# Patient Record
Sex: Female | Born: 1965 | Race: Black or African American | Hispanic: No | Smoking: Current some day smoker
Health system: Southern US, Community
[De-identification: ages and names within clinical notes are randomized; demographics above are authoritative.]

## PROBLEM LIST (undated history)

## (undated) DIAGNOSIS — N926 Irregular menstruation, unspecified: Secondary | ICD-10-CM

## (undated) DIAGNOSIS — F32A Depression, unspecified: Secondary | ICD-10-CM

## (undated) DIAGNOSIS — T4145XA Adverse effect of unspecified anesthetic, initial encounter: Secondary | ICD-10-CM

## (undated) DIAGNOSIS — M1990 Unspecified osteoarthritis, unspecified site: Secondary | ICD-10-CM

## (undated) DIAGNOSIS — I499 Cardiac arrhythmia, unspecified: Secondary | ICD-10-CM

## (undated) DIAGNOSIS — D649 Anemia, unspecified: Secondary | ICD-10-CM

## (undated) DIAGNOSIS — F329 Major depressive disorder, single episode, unspecified: Secondary | ICD-10-CM

## (undated) DIAGNOSIS — E559 Vitamin D deficiency, unspecified: Secondary | ICD-10-CM

## (undated) DIAGNOSIS — Z8489 Family history of other specified conditions: Secondary | ICD-10-CM

## (undated) DIAGNOSIS — I10 Essential (primary) hypertension: Secondary | ICD-10-CM

## (undated) DIAGNOSIS — E119 Type 2 diabetes mellitus without complications: Secondary | ICD-10-CM

## (undated) HISTORY — DX: Vitamin D deficiency, unspecified: E55.9

## (undated) HISTORY — PX: CHOLECYSTECTOMY: SHX55

## (undated) HISTORY — DX: Essential (primary) hypertension: I10

## (undated) HISTORY — PX: KNEE ARTHROSCOPY: SUR90

## (undated) HISTORY — PX: JOINT REPLACEMENT: SHX530

## (undated) HISTORY — DX: Irregular menstruation, unspecified: N92.6

## (undated) HISTORY — DX: Type 2 diabetes mellitus without complications: E11.9

---

## 1999-11-30 ENCOUNTER — Emergency Department (HOSPITAL_COMMUNITY): Admission: EM | Admit: 1999-11-30 | Discharge: 1999-11-30 | Payer: Self-pay | Admitting: Emergency Medicine

## 2000-07-09 DIAGNOSIS — T8859XA Other complications of anesthesia, initial encounter: Secondary | ICD-10-CM

## 2000-07-09 HISTORY — DX: Other complications of anesthesia, initial encounter: T88.59XA

## 2008-05-14 ENCOUNTER — Emergency Department (HOSPITAL_BASED_OUTPATIENT_CLINIC_OR_DEPARTMENT_OTHER): Admission: EM | Admit: 2008-05-14 | Discharge: 2008-05-14 | Payer: Self-pay | Admitting: Emergency Medicine

## 2009-01-11 ENCOUNTER — Emergency Department (HOSPITAL_BASED_OUTPATIENT_CLINIC_OR_DEPARTMENT_OTHER): Admission: EM | Admit: 2009-01-11 | Discharge: 2009-01-11 | Payer: Self-pay | Admitting: Emergency Medicine

## 2009-04-10 ENCOUNTER — Emergency Department (HOSPITAL_BASED_OUTPATIENT_CLINIC_OR_DEPARTMENT_OTHER): Admission: EM | Admit: 2009-04-10 | Discharge: 2009-04-10 | Payer: Self-pay | Admitting: Emergency Medicine

## 2009-04-10 ENCOUNTER — Ambulatory Visit: Payer: Self-pay | Admitting: Diagnostic Radiology

## 2010-10-05 IMAGING — CR DG KNEE COMPLETE 4+V*L*
4 series · 4 of 4 positions shown · non-contrast
Comparison: None.

CLINICAL DATA: Left knee pain following a fall.  Previous knee
surgery.

LEFT KNEE - COMPLETE 4+ VIEW

[t knee ap left]
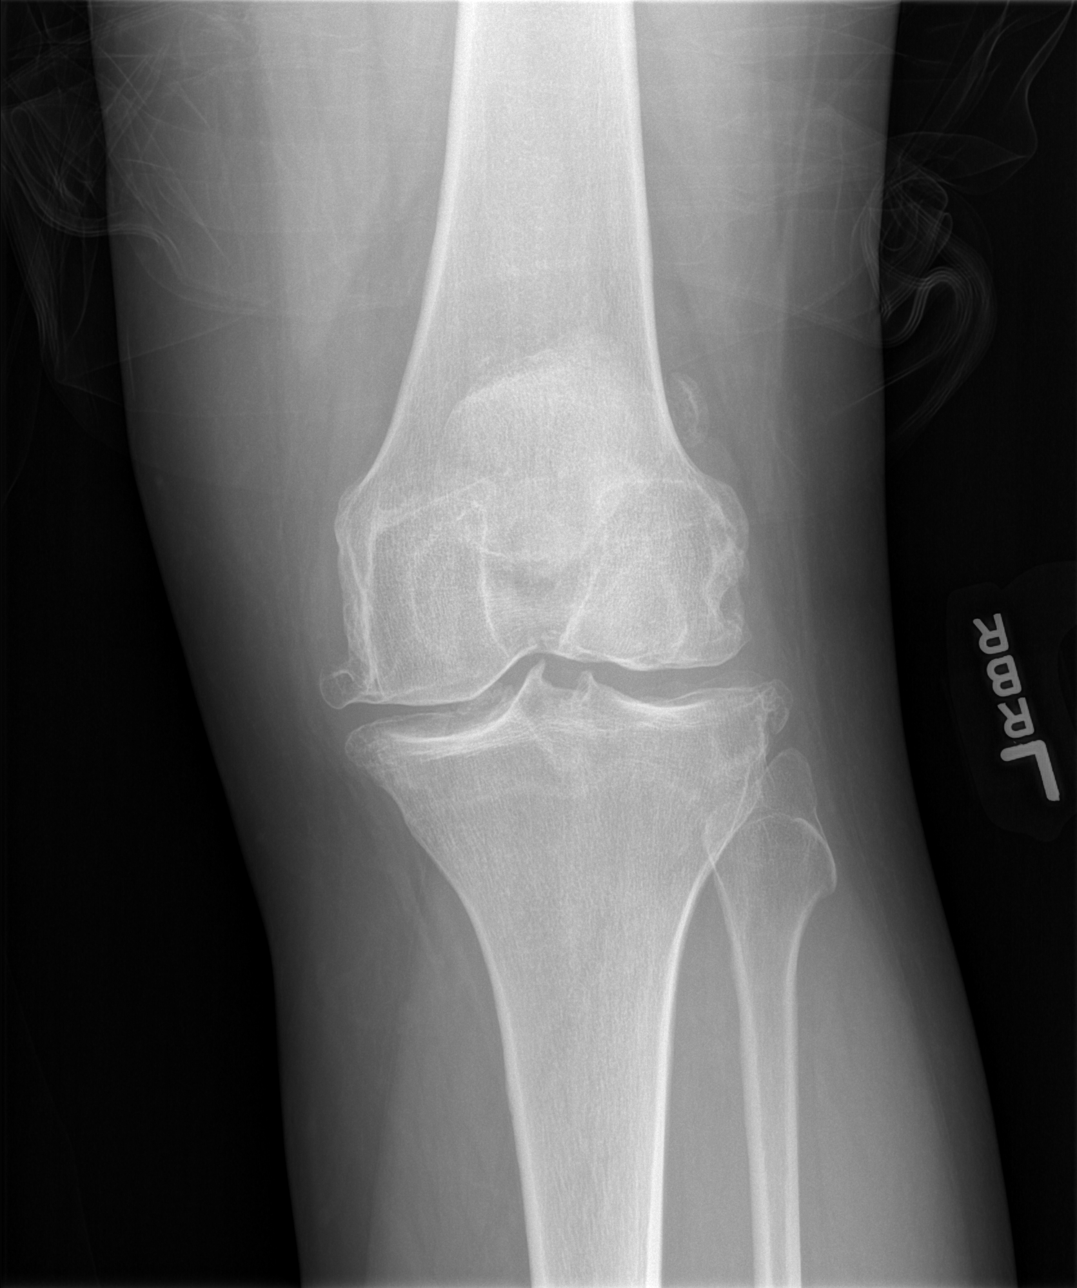

[t knee oblique left (1 of 2)]
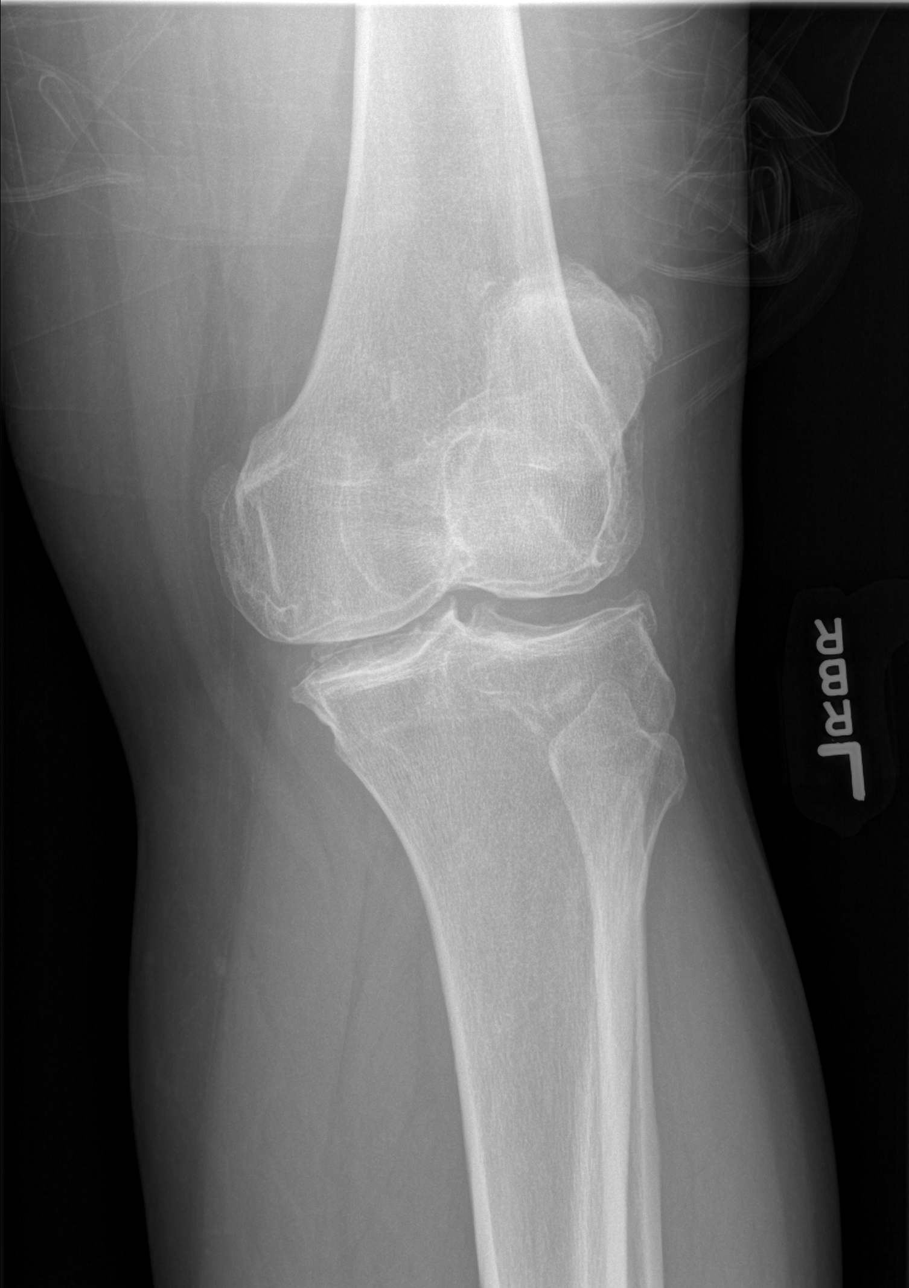

[t knee oblique left (2 of 2)]
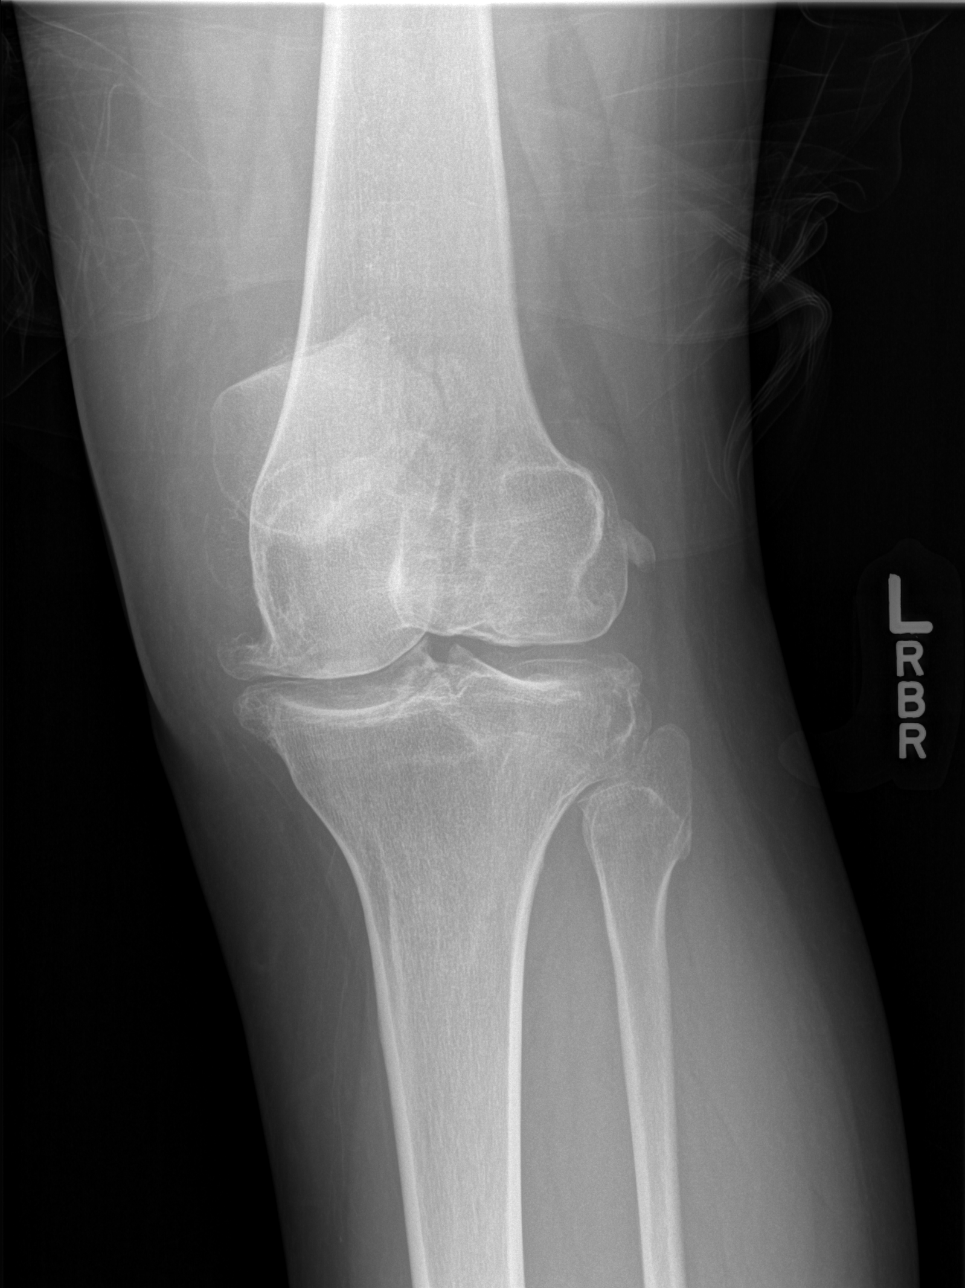

[t knee lat left]
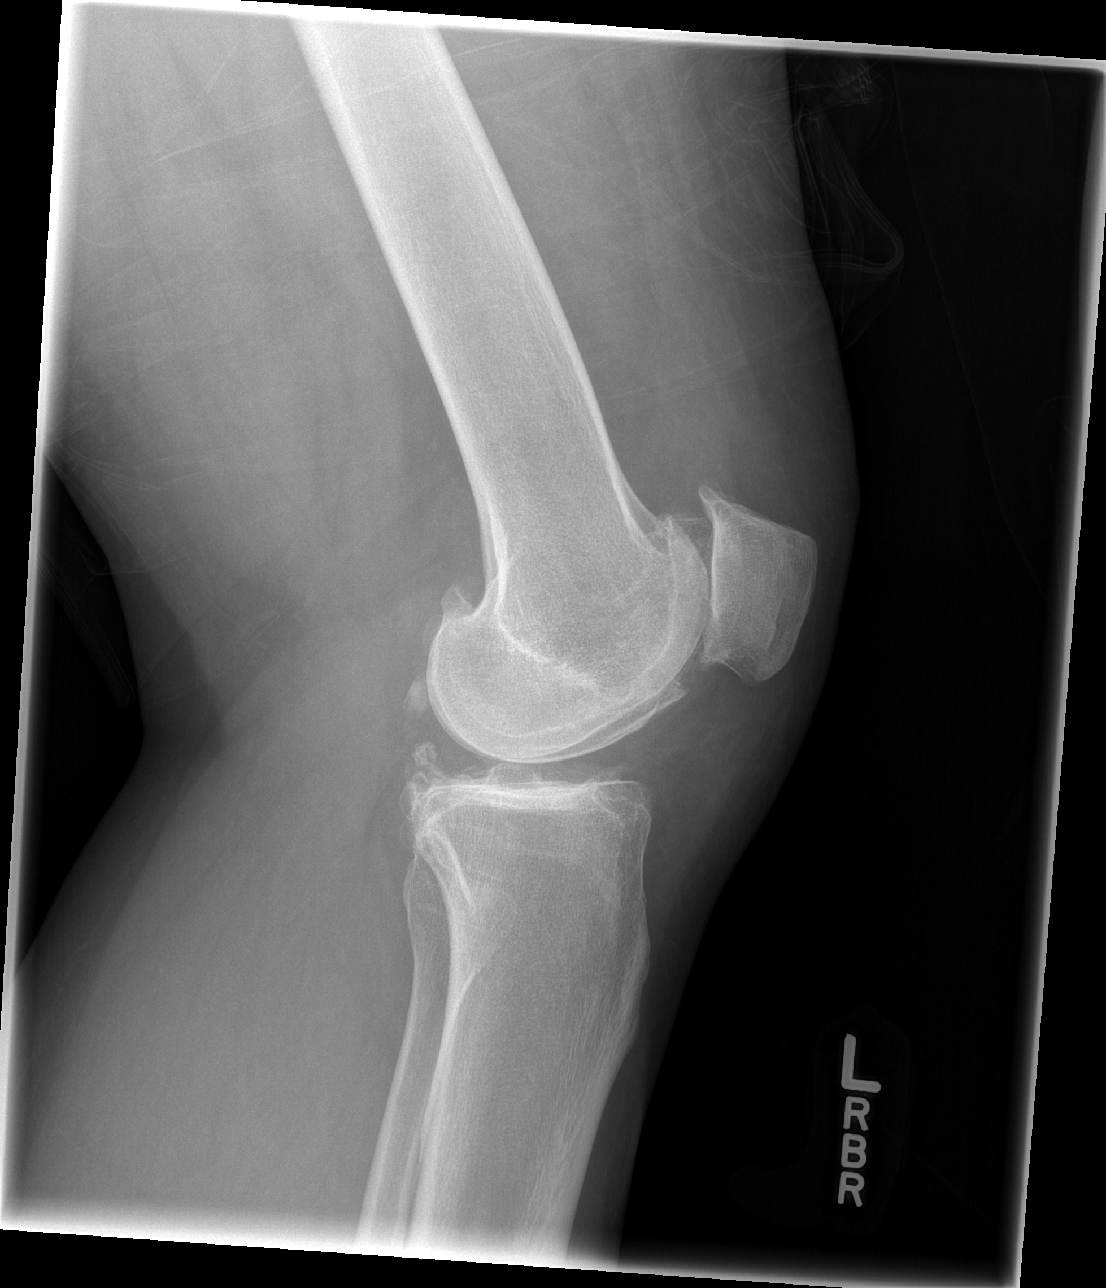

[4 of 4 positions shown; findings below may reference images not displayed]

FINDINGS: Moderate spur formation involving all three joint
compartments.  Posterior loose bodies.  Small to moderate sized
effusion.  No fracture or dislocation seen.
IMPRESSION: 1.  Moderate degenerative changes.
2.  Small to moderate sized effusion.
3.  Posterior loose bodies.
4.  No fracture seen.

## 2010-10-05 IMAGING — CR DG FOOT COMPLETE 3+V*L*
3 series · 3 of 3 positions shown · non-contrast
Comparison: None.

CLINICAL DATA: Left foot pain following a fall.

LEFT FOOT - COMPLETE 3+ VIEW

[t foot ap left]
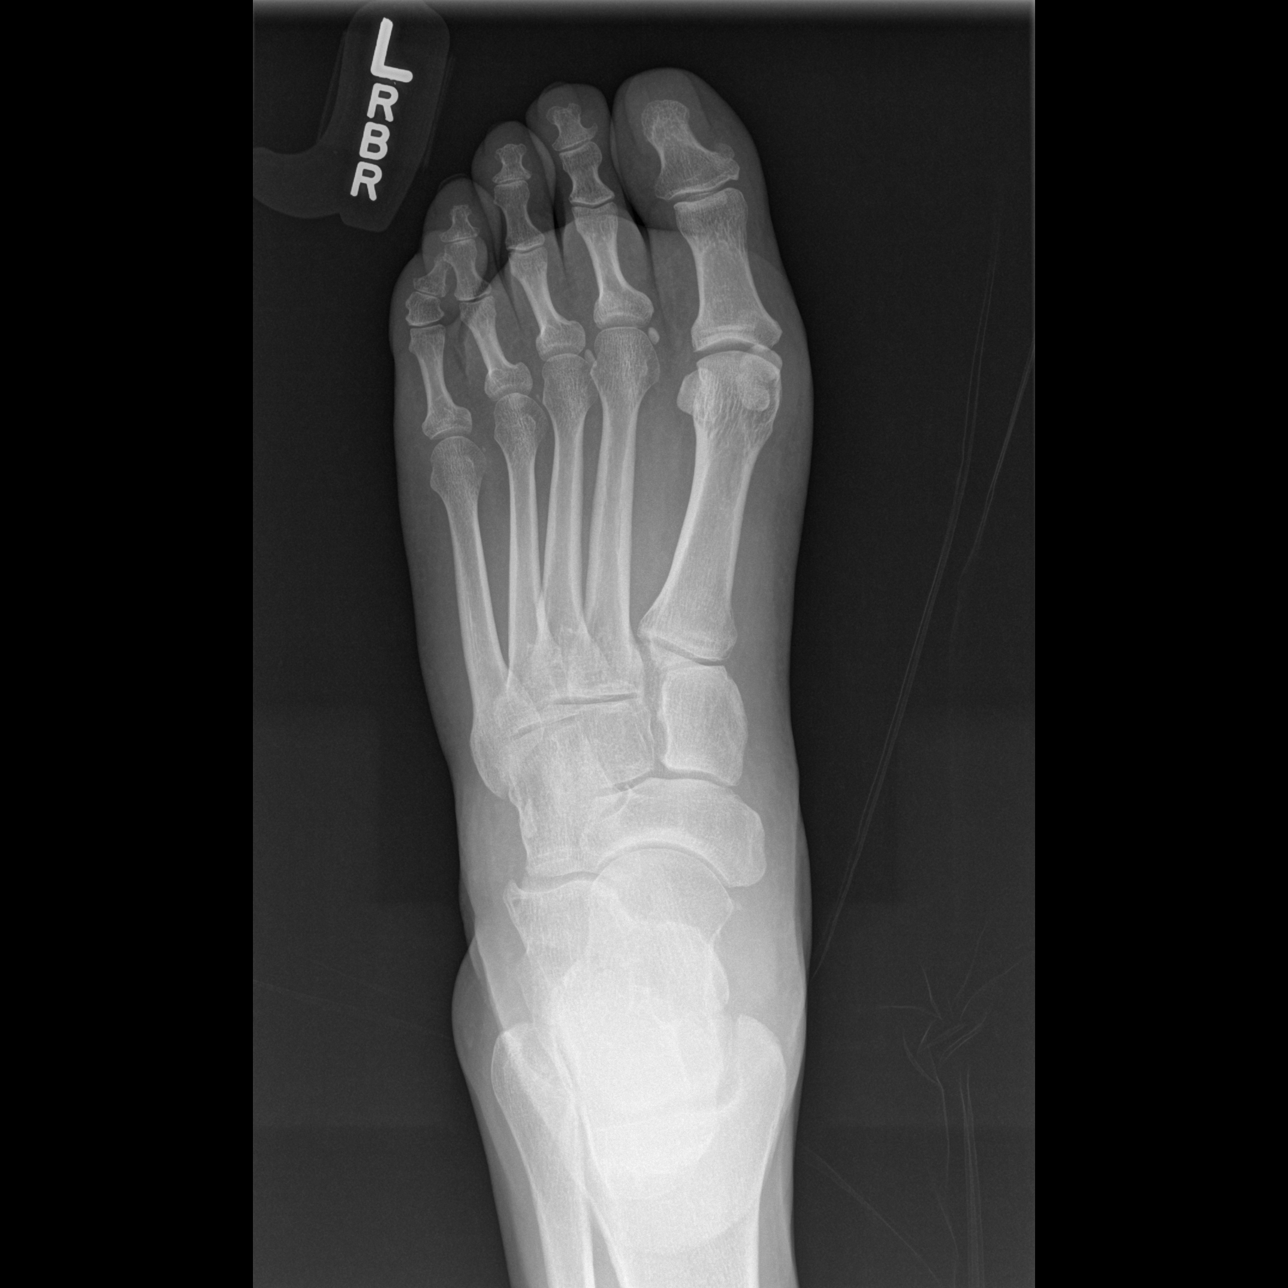

[t foot oblique left]
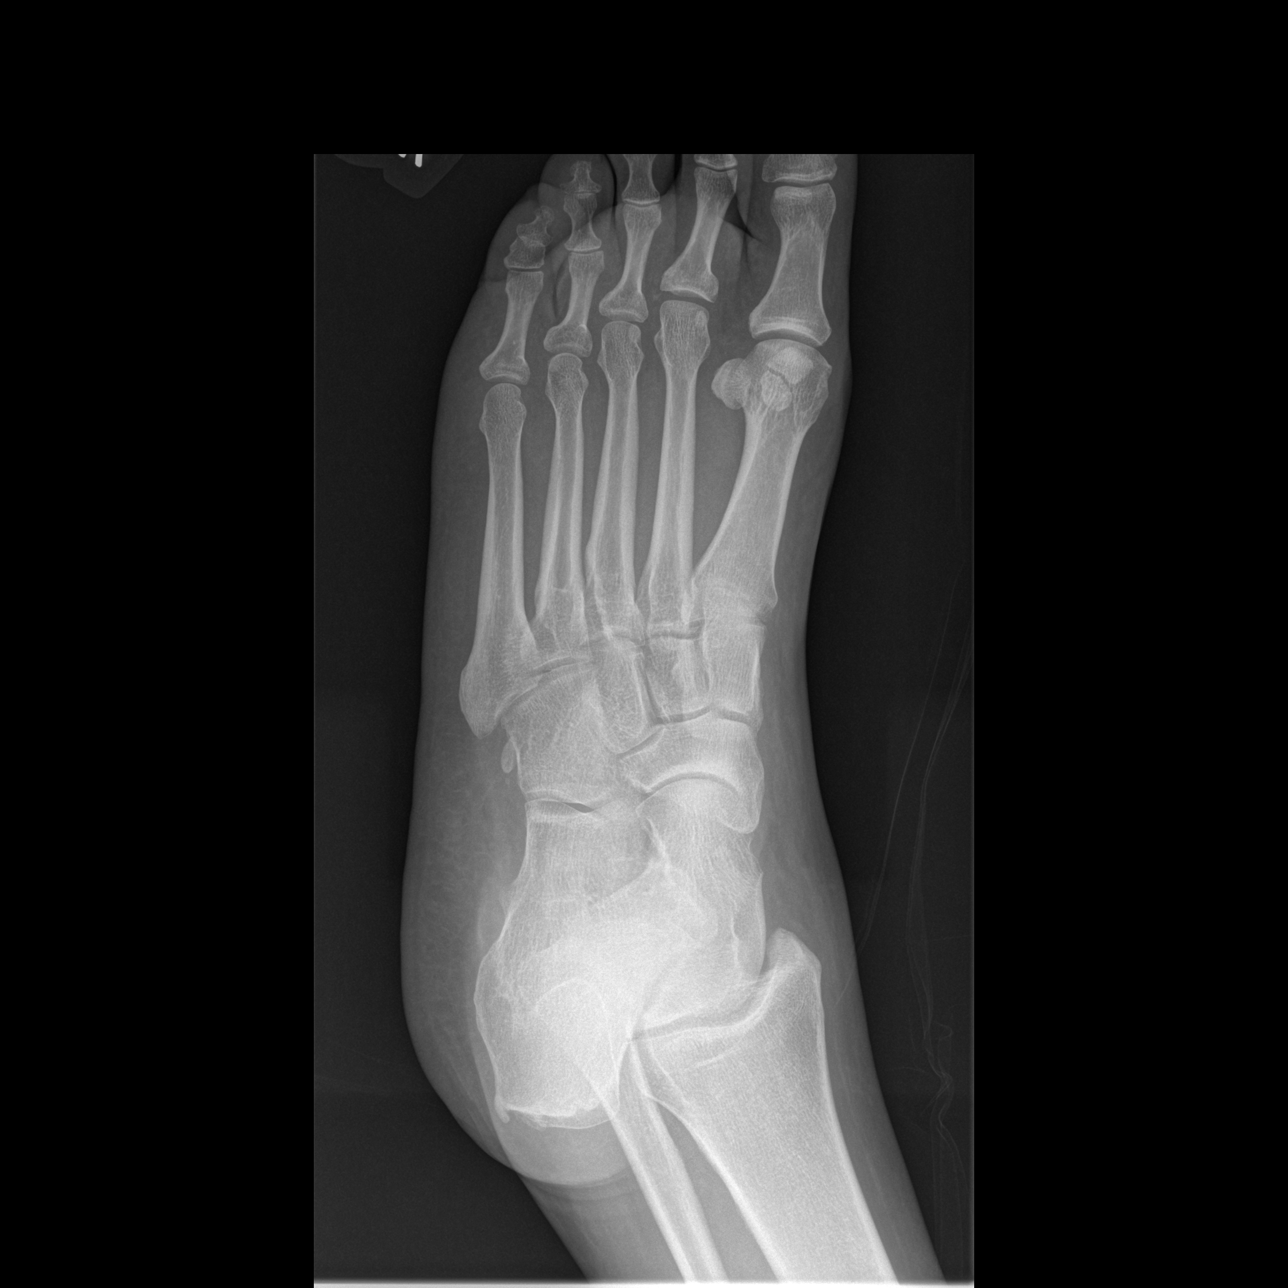

[t foot lat left]
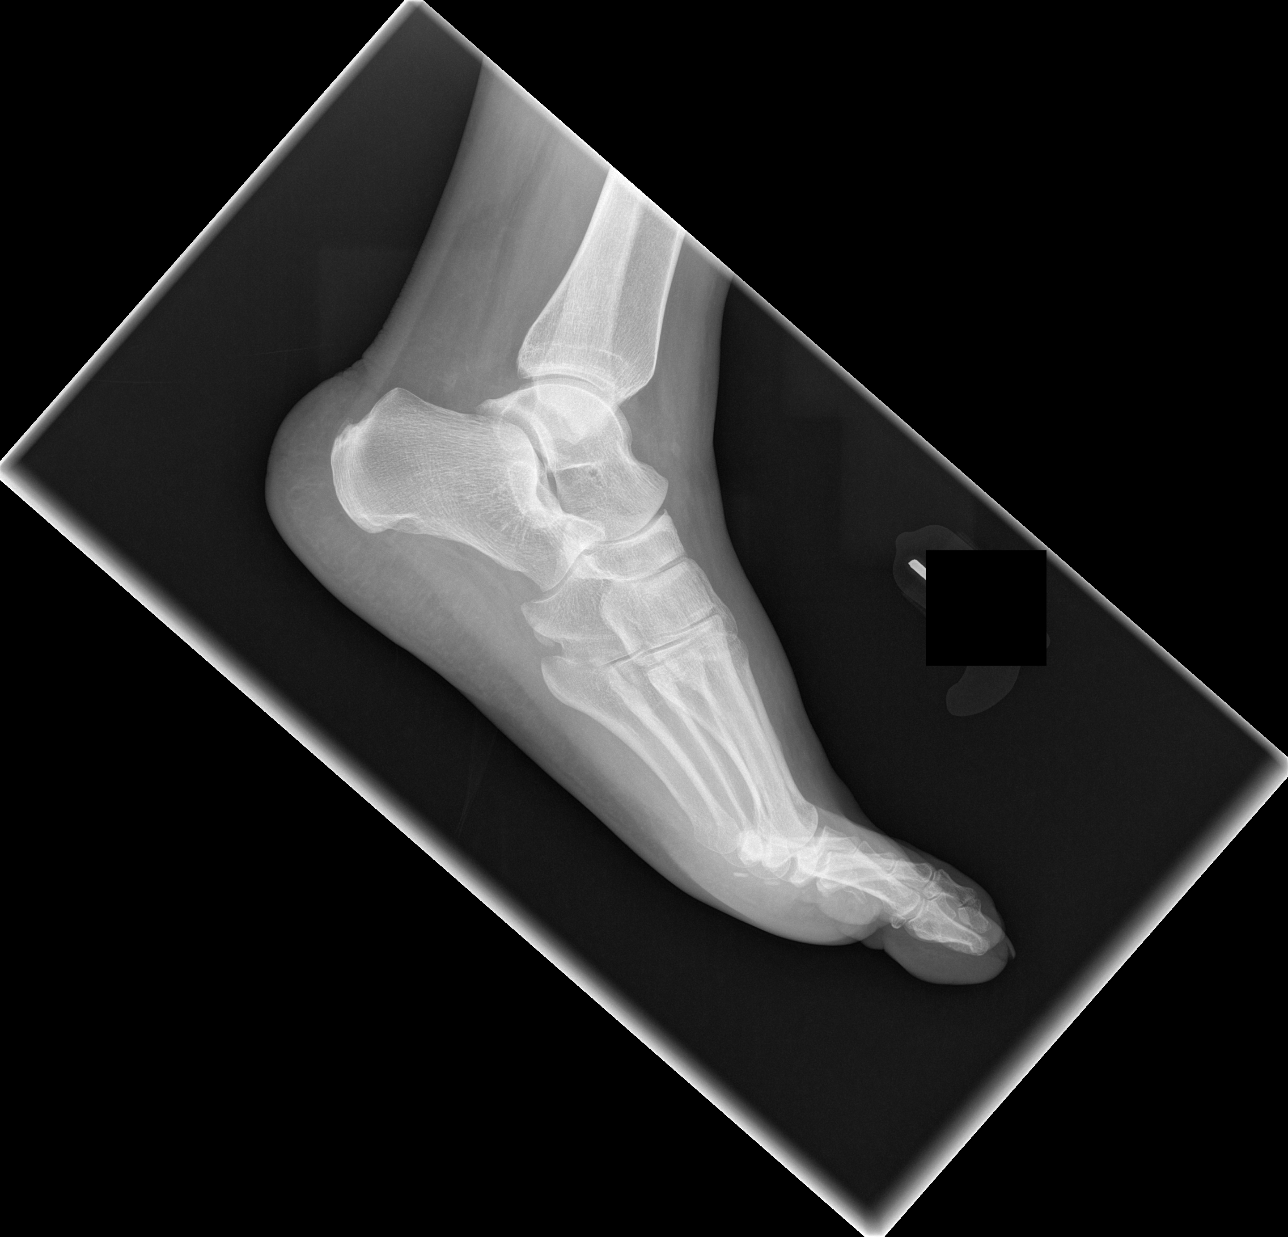

[3 of 3 positions shown; findings below may reference images not displayed]

FINDINGS: Mild posterior calcaneal spur formation.  No fracture or
dislocation seen.
IMPRESSION: No fracture or dislocation.

## 2010-10-12 LAB — URINE MICROSCOPIC-ADD ON

## 2010-10-12 LAB — COMPREHENSIVE METABOLIC PANEL
AST: 23 U/L (ref 0–37)
Albumin: 3.8 g/dL (ref 3.5–5.2)
Alkaline Phosphatase: 55 U/L (ref 39–117)
BUN: 11 mg/dL (ref 6–23)
Chloride: 105 mEq/L (ref 96–112)
Creatinine, Ser: 0.8 mg/dL (ref 0.4–1.2)
GFR calc Af Amer: >60 mL/min (ref >60)
Glucose, Bld: 220 mg/dL — ABNORMAL HIGH (ref 70–99)
Sodium: 140 mEq/L (ref 135–145)
Total Bilirubin: 0.2 mg/dL — ABNORMAL LOW (ref 0.3–1.2)

## 2010-10-12 LAB — URINALYSIS, ROUTINE W REFLEX MICROSCOPIC
Glucose, UA: NEGATIVE mg/dL
Ketones, ur: 15 mg/dL — AB
Nitrite: NEGATIVE
Specific Gravity, Urine: 1.022 (ref 1.005–1.030)
Urobilinogen, UA: 0.2 mg/dL (ref 0.0–1.0)
pH: 5.5 (ref 5.0–8.0)

## 2010-10-12 LAB — DIFFERENTIAL
Basophils Absolute: 0.5 10*3/uL — ABNORMAL HIGH (ref 0.0–0.1)
Lymphocytes Relative: 21 % (ref 12–46)
Monocytes Absolute: 0.8 10*3/uL (ref 0.1–1.0)

## 2010-10-12 LAB — CBC
HCT: 37.4 % (ref 36.0–46.0)
Hemoglobin: 13.2 g/dL (ref 12.0–15.0)
MCHC: 35.2 g/dL (ref 30.0–36.0)
MCV: 92.1 fL (ref 78.0–100.0)
RDW: 12.3 % (ref 11.5–15.5)
WBC: 15.1 10*3/uL — ABNORMAL HIGH (ref 4.0–10.5)

## 2010-10-12 LAB — URINE CULTURE

## 2013-03-17 ENCOUNTER — Emergency Department: Payer: Self-pay | Admitting: Emergency Medicine

## 2013-03-17 LAB — CK TOTAL AND CKMB (NOT AT ARMC): CK, Total: 97 U/L (ref 21–215)

## 2013-03-17 LAB — COMPREHENSIVE METABOLIC PANEL
Albumin: 3.5 g/dL (ref 3.4–5.0)
EGFR (Non-African Amer.): >60
Potassium: 3.6 mmol/L (ref 3.5–5.1)
SGOT(AST): 20 U/L (ref 15–37)

## 2013-03-17 LAB — CBC
HGB: 12.3 g/dL (ref 12.0–16.0)
RBC: 3.93 10*6/uL (ref 3.80–5.20)
RDW: 13.2 % (ref 11.5–14.5)
WBC: 16 10*3/uL — ABNORMAL HIGH (ref 3.6–11.0)

## 2013-03-17 LAB — PRO B NATRIURETIC PEPTIDE: B-Type Natriuretic Peptide: 13 pg/mL (ref 0–125)

## 2013-03-17 LAB — TROPONIN I
Troponin-I: <0.02 ng/mL
Troponin-I: <0.02 ng/mL

## 2013-07-09 LAB — HM PAP SMEAR: HM PAP: NORMAL

## 2015-07-18 ENCOUNTER — Encounter: Payer: Self-pay | Admitting: Certified Nurse Midwife

## 2015-08-05 ENCOUNTER — Ambulatory Visit (INDEPENDENT_AMBULATORY_CARE_PROVIDER_SITE_OTHER): Payer: 59 | Admitting: Obstetrics and Gynecology

## 2015-08-05 ENCOUNTER — Encounter: Payer: Self-pay | Admitting: Obstetrics and Gynecology

## 2015-08-05 VITALS — BP 132/82 | HR 90 | Ht 69.0 in | Wt 221.1 lb

## 2015-08-05 DIAGNOSIS — N979 Female infertility, unspecified: Secondary | ICD-10-CM

## 2015-08-05 DIAGNOSIS — E1165 Type 2 diabetes mellitus with hyperglycemia: Secondary | ICD-10-CM | POA: Insufficient documentation

## 2015-08-05 DIAGNOSIS — Z862 Personal history of diseases of the blood and blood-forming organs and certain disorders involving the immune mechanism: Secondary | ICD-10-CM | POA: Diagnosis not present

## 2015-08-05 DIAGNOSIS — E669 Obesity, unspecified: Secondary | ICD-10-CM | POA: Insufficient documentation

## 2015-08-05 DIAGNOSIS — N938 Other specified abnormal uterine and vaginal bleeding: Secondary | ICD-10-CM

## 2015-08-05 DIAGNOSIS — E118 Type 2 diabetes mellitus with unspecified complications: Secondary | ICD-10-CM

## 2015-08-05 DIAGNOSIS — I10 Essential (primary) hypertension: Secondary | ICD-10-CM | POA: Diagnosis not present

## 2015-08-05 DIAGNOSIS — E119 Type 2 diabetes mellitus without complications: Secondary | ICD-10-CM | POA: Insufficient documentation

## 2015-08-05 NOTE — Patient Instructions (Signed)

## 2015-08-05 NOTE — Progress Notes (Signed)
Subjective:     Patient ID: Michelle Marshall, female   DOB: Mar 13, 1966, 50 y.o.   MRN: [id number]  HPI Reports normal monthly menses since onset at age 33 until early December, at which time her menses started 3 weeks early, bleeding for 5-7 days with heavy flow, then no bleeding x 1 week, and restarted bleeding again for another week. Reports having to change tampon with pads every 1 to 1 1/2 hours the first 3 days of each bleeding episodes, and heavy cramping. Reports feeling really fatigued.  Does have a history of anemia and was lasted treated with iron a year ago.  Denies any other symptoms.  Review of Systems See above, denies weight changes or other symptoms, no hot flashes, and diabetes and HTN are both stable.    Objective:   Physical Exam A&O x4 Well groomed female in no distress Thyroid normal on palpation HRR  Abdomen soft and non-tender. Pelvic exam: normal external genitalia, vulva, vagina, cervix, uterus and adnexa, moderate amount dark red blood noted.    Assessment:     DUB Obesity Fatigue H/O anemia     Plan:     Labs obtained and pelvic ultrasound ordered. Reviewed possible causes of current bleeding, including: perimenopause, thyroid dysfunction; anemia, uterine fibroids, & ovarian cyst.  Reviewed possible treatment options according to findings, and recommended starting a low dose OCP while waiting on results to stop current bleeding. 1 pack Taytula given to start today.  Is strongly interested in ablation therapy if it is indicated.  Will have her see Dr Orlene Plum after ultrasound to discuss more.  Melody Amsterdam, CNM

## 2015-08-06 LAB — FSH/LH
FSH: 11.1 m[IU]/mL
LH: 4.9 m[IU]/mL

## 2015-08-06 LAB — CBC
HEMATOCRIT: 37.2 % (ref 34.0–46.6)
Hemoglobin: 12.8 g/dL (ref 11.1–15.9)
MCH: 30 pg (ref 26.6–33.0)
MCHC: 34.4 g/dL (ref 31.5–35.7)
MCV: 87 fL (ref 79–97)
Platelets: 432 10*3/uL — ABNORMAL HIGH (ref 150–379)
RBC: 4.26 x10E6/uL (ref 3.77–5.28)
RDW: 12.5 % (ref 12.3–15.4)
WBC: 9.4 10*3/uL (ref 3.4–10.8)

## 2015-08-06 LAB — THYROID PANEL WITH TSH
FREE THYROXINE INDEX: 1.7 (ref 1.2–4.9)
T3 UPTAKE RATIO: 25 % (ref 24–39)
T4, Total: 6.9 ug/dL (ref 4.5–12.0)
TSH: 1.22 u[IU]/mL (ref 0.450–4.500)

## 2015-08-06 LAB — ESTRADIOL: ESTRADIOL: 6 pg/mL

## 2015-08-06 LAB — FERRITIN: Ferritin: 18 ng/mL (ref 15–150)

## 2015-08-06 LAB — PROGESTERONE: Progesterone: 0.3 ng/mL

## 2015-08-10 ENCOUNTER — Telehealth: Payer: Self-pay | Admitting: *Deleted

## 2015-08-10 NOTE — Telephone Encounter (Signed)
Notified pt of lab results 

## 2015-08-10 NOTE — Telephone Encounter (Signed)
-----   Message from Joylene Igo, [place] sent at 08/09/2015  5:05 PM EST ----- Please let her know all labs looked normal, and no signs of menopause yet,

## 2015-08-18 ENCOUNTER — Ambulatory Visit (INDEPENDENT_AMBULATORY_CARE_PROVIDER_SITE_OTHER): Payer: 59 | Admitting: Obstetrics and Gynecology

## 2015-08-18 ENCOUNTER — Ambulatory Visit (INDEPENDENT_AMBULATORY_CARE_PROVIDER_SITE_OTHER): Payer: 59

## 2015-08-18 ENCOUNTER — Encounter: Payer: Self-pay | Admitting: Obstetrics and Gynecology

## 2015-08-18 VITALS — BP 138/84 | HR 90 | Ht 69.0 in | Wt 222.5 lb

## 2015-08-18 DIAGNOSIS — D259 Leiomyoma of uterus, unspecified: Secondary | ICD-10-CM | POA: Diagnosis not present

## 2015-08-18 DIAGNOSIS — N938 Other specified abnormal uterine and vaginal bleeding: Secondary | ICD-10-CM

## 2015-08-18 DIAGNOSIS — N939 Abnormal uterine and vaginal bleeding, unspecified: Secondary | ICD-10-CM

## 2015-08-18 MED ORDER — MEDROXYPROGESTERONE ACETATE 10 MG PO TABS: 30.0000 mg | ORAL_TABLET | Freq: Every day | ORAL | Status: DC

## 2015-08-18 NOTE — Patient Instructions (Signed)
Endometrial Ablation °Endometrial ablation removes the lining of the uterus (endometrium). It is usually a same-day, outpatient treatment. Ablation helps avoid major surgery, such as surgery to remove the cervix and uterus (hysterectomy). After endometrial ablation, you will have little or no menstrual bleeding and may not be able to have children. However, if you are premenopausal, you will need to use a reliable method of birth control following the procedure because of the small chance that pregnancy can occur. °There are different reasons to have this procedure. These reasons include: °· Heavy periods. °· Bleeding that is causing anemia. °· Irregular bleeding. °· Bleeding fibroids on the lining inside the uterus if they are smaller than 3 centimeters. °This procedure may not be possible for you if:  °· You want to have children in the future.   °· You have severe cramps with your menstrual period.   °· You have precancerous or cancerous cells in your uterus.   °· You were recently pregnant.   °· You have gone through menopause.   °· You have had major surgery on your uterus, resulting in thinning of the uterine wall. Surgeries may include: °¨ The removal of one or more uterine fibroids (myomectomy). °¨ A cesarean section with a classic (vertical) incision on your uterus. Ask your health care provider what type of cesarean you had. Sometimes the scar on your skin is different than the scar on your uterus. °Even if you have had surgery on your uterus, certain types of ablation may still be safe for you. Talk with your health care provider. °LET YOUR HEALTH CARE PROVIDER KNOW ABOUT: °· Any allergies you have. °· All medicines you are taking, including vitamins, herbs, eye drops, creams, and over-the-counter medicines. °· Previous problems you or members of your family have had with the use of anesthetics. °· Any blood disorders you have. °· Previous surgeries you have had. °· Medical conditions you have. °RISKS AND  COMPLICATIONS  °Generally, this is a safe procedure. However, as with any procedure, complications can occur. Possible complications include: °· Perforation of the uterus. °· Bleeding. °· Infection of the uterus, bladder, or vagina. °· Injury to surrounding organs. °· An air bubble to the lung (air embolus). °· Pregnancy following the procedure. °· Failure of the procedure to help the problem, requiring hysterectomy. °· Decreased ability to diagnose cancer in the lining of the uterus. °BEFORE THE PROCEDURE °· The lining of the uterus must be tested to make sure there is no pre-cancerous or cancer cells present. °· An ultrasound may be performed to look at the size of the uterus and to check for abnormalities. °· Medicines may be given to thin the lining of the uterus. °PROCEDURE  °During the procedure, your health care provider will use a tool called a resectoscope to help see inside your uterus. There are different ways to remove the lining of your uterus.  °· Radiofrequency - This method uses a radiofrequency-alternating electric current to remove the lining of the uterus. °· Cryotherapy - This method uses extreme cold to freeze the lining of the uterus. °· Heated-Free Liquid - This method uses heated salt (saline) solution to remove the lining of the uterus. °· Microwave - This method uses high-energy microwaves to heat up the lining of the uterus to remove it. °· Thermal balloon - This method involves inserting a catheter with a balloon tip into the uterus. The balloon tip is filled with heated fluid to remove the lining of the uterus. °AFTER THE PROCEDURE  °After your procedure, do   not have sexual intercourse or insert anything into your vagina until permitted by your health care provider. After the procedure, you may experience: °· Cramps. °· Vaginal discharge. °· Frequent urination. °  °This information is not intended to replace advice given to you by your health care provider. Make sure you discuss any  questions you have with your health care provider. °  °Document Released: 05/04/2004 Document Revised: 03/16/2015 Document Reviewed: 11/26/2012 °Elsevier Interactive Patient Education ©2016 [employer] ° °

## 2015-08-18 NOTE — Progress Notes (Signed)
GYN ENCOUNTER NOTE  Subjective:       Michelle Marshall is a 50 y.o. G51P0020 female is here for gynecologic evaluation of the following issues:  1. Follow-up for ultrasound results and DUB.    Patient was recently seen in our office by [facility] for dysfunctional uterine bleeding and review of ultrasound results. Reports normal monthly menses since onset at age 35 until November 16th, when she had her menses early. She had her menses for 7 days, occurring every 7 days since then. Her menses lasted for 7 days as well until she started [place] one and a half weeks ago. Since then she has not had a period. Ultrasound findings on 08/18/2015 revealed a left fundus fibroid which measures 1.6 x 1.9 x 1.4cm. Patient has a history of DM and HTN. Of note, the patient reports having an anaphylactic reaction to anesthesia in her early 39's when seen at [facility] in [place].     Gynecologic History Patient's last menstrual period was 08/03/2015. Contraception: NA Last Pap: NA Last mammogram: NA  Obstetric History OB History  Gravida Para Term Preterm AB SAB TAB Ectopic Multiple Living  2    2 2         # Outcome Date GA Lbr Len/2nd Weight Sex Delivery Anes PTL Lv  2 SAB           1 SAB               Past Medical History  Diagnosis Date  . Irregular periods   . Diabetes mellitus without complication ([place])   . Hypertension     Past Surgical History  Procedure Laterality Date  . Gallbladder surgery      yrs ago    Current Outpatient Prescriptions on File Prior to Visit  Medication Sig Dispense Refill  . carvedilol (COREG) 12.5 MG tablet Take 12.5 mg by mouth 2 (two) times daily with a meal.    . glipiZIDE (GLUCOTROL) 5 MG tablet Take by mouth daily before breakfast.    . Linagliptin-Metformin HCl (JENTADUETO) 2.11-998 MG TABS Take by mouth.    Marland Kitchen LORazepam (ATIVAN) 0.5 MG tablet Take 0.5 mg by mouth every 8 (eight) hours.    . traMADol (ULTRAM) 50 MG tablet Take by  mouth every 6 (six) hours as needed.     No current facility-administered medications on file prior to visit.    Allergies  Allergen Reactions  . Lisinopril   . Wellbutrin [Bupropion]     Social History   Social History  . Marital Status: Married    Spouse Name: N/A  . Number of Children: N/A  . Years of Education: N/A   Occupational History  . Not on file.   Social History Main Topics  . Smoking status: Never Smoker   . Smokeless tobacco: Never Used  . Alcohol Use: No  . Drug Use: No  . Sexual Activity: Yes    Birth Control/ Protection: None   Other Topics Concern  . Not on file   Social History Narrative    Family History  Problem Relation Age of Onset  . Cancer Father     throat    The following portions of the patient's history were reviewed and updated as appropriate: allergies, current medications, past family history, past medical history, past social history, past surgical history and problem list.  Review of Systems  Review of Systems - General ROS: negative for - chills, fatigue, fever, night sweats Gastrointestinal ROS: negative for -  abdominal pain, change in bowel habits and nausea/vomiting Genito-Urinary ROS: See above  Objective:   BP 138/84 mmHg  Pulse 90  Ht 5\' 9"  (1.753 m)  Wt 222 lb 8 oz (100.925 kg)  BMI 32.84 kg/m2  LMP 08/03/2015 CONSTITUTIONAL: Well-developed, well-nourished female in no acute distress.  HENT:  Normocephalic, atraumatic.  NECK: Normal range of motion, supple, no masses.  Normal thyroid.  [place]: Alert and oriented to person, place, and time.  PSYCHIATRIC: Normal mood and affect. Normal behavior. Normal judgment and thought content. CARDIOVASCULAR: Normal S1/S2, no m/r/g RESPIRATORY: CTAB ABDOMEN: Soft, non distended; Non tender.  No Organomegaly. PELVIC: Not examined     Assessment:   1. Dysfunctional uterine bleeding; endometrial biopsy demonstrates uterine fibroid; needs endometrial biopsy;; should  not be on combined oral contraceptive for regulation of uterine bleeding because of hypertension and diabetes.  After the age of 71 2. Hypertension 3. 2 Cm fibroid on ultrasound 4.  Diabetes mellitus type 2   Plan:   1. Discontinue Taytulla 2. Start  Provera 30 mg daily 3. Return in 1 week for endometrial biopsy, take Advil prior to procedure 4. Schedule NovaSure within 6 weeks  A total of 15 minutes were spent face-to-face with the patient during this encounter and over half of that time dealt with counseling and coordination of care.  Emil Jimmye Norman PA-S Brayton Mars, MD   I have seen, interviewed, and examined the patient in conjunction with the [facility] student and affirm the diagnosis and management plan. Martin A. DeFrancesco, MD, FACOG   Note: This dictation was prepared with Dragon dictation along with smaller phrase technology. Any transcriptional errors that result from this process are unintentional.

## 2015-08-21 DIAGNOSIS — N939 Abnormal uterine and vaginal bleeding, unspecified: Secondary | ICD-10-CM

## 2015-08-21 DIAGNOSIS — D250 Submucous leiomyoma of uterus: Secondary | ICD-10-CM | POA: Insufficient documentation

## 2015-08-21 HISTORY — DX: Abnormal uterine and vaginal bleeding, unspecified: N93.9

## 2015-08-24 ENCOUNTER — Encounter: Payer: Self-pay | Admitting: Obstetrics and Gynecology

## 2015-08-24 ENCOUNTER — Ambulatory Visit (INDEPENDENT_AMBULATORY_CARE_PROVIDER_SITE_OTHER): Payer: 59 | Admitting: Obstetrics and Gynecology

## 2015-08-24 VITALS — BP 147/96 | HR 89 | Wt 225.6 lb

## 2015-08-24 DIAGNOSIS — N938 Other specified abnormal uterine and vaginal bleeding: Secondary | ICD-10-CM | POA: Diagnosis not present

## 2015-08-24 DIAGNOSIS — I10 Essential (primary) hypertension: Secondary | ICD-10-CM | POA: Diagnosis not present

## 2015-08-24 DIAGNOSIS — N939 Abnormal uterine and vaginal bleeding, unspecified: Secondary | ICD-10-CM | POA: Diagnosis not present

## 2015-08-24 DIAGNOSIS — D259 Leiomyoma of uterus, unspecified: Secondary | ICD-10-CM

## 2015-08-24 LAB — POCT URINE PREGNANCY: PREG TEST UR: NEGATIVE

## 2015-08-24 NOTE — Patient Instructions (Signed)
1.  Endometrial biopsy is done today. 2.  Results of biopsy will be given to you when available. 3.  Schedule hysteroscopy/D&C, NovaSure ablation Anytime after 2 weeks from now. 4.  Return for preoperative appointment

## 2015-08-24 NOTE — Progress Notes (Signed)
Chief complaint: 1.  Follow-up on Provera therapy. 2.  Endometrial biopsy.  Patient had to stop birth control pills  At last visit because of comorbidities of hypertension and age greater than 7.  She was placed on Provera 30 mg a day.  She has gained 5 pounds since starting the Provera.  She is not having any abnormal uterine bleeding. Patient presents for endometrial biopsy today prior to scheduling and NovaSure ablation procedure.  OBJECTIVE:BP 147/96 mmHg  Pulse 89  Wt 225 lb 9 oz (102.314 kg)  LMP 08/03/2015  Pleasant African-American female in no acute distress.  Alert and oriented. Abdomen: Soft, nontender, without organomegaly. Pelvic exam: Bimanual-midplane uterus, normal size and shape, nontender.  PROCEDURE: Endometrial biopsy  Endometrial Biopsy Procedure Note  Pre-operative Diagnosis:Abnormal uterine bleeding  Post-operative Diagnosis: same  Procedure Details   Urine pregnancy test was not done.  The risks (including infection, bleeding, pain, and uterine perforation) and benefits of the procedure were explained to the patient and Verbal informed consent was obtained.  Antibiotic prophylaxis against endocarditis was not indicated.   The patient was placed in the dorsal lithotomy position.  Bimanual exam showed the uterus to be in the neutral position.  A Graves' speculum inserted in the vagina, and the cervix prepped with povidone iodine.  Endocervical curettage with a Kevorkian curette was not performed.  Paracervical block using 1% lidocaine without epinephrine, 8 cc, injected at the 3:00 and 9:00 positions.   A sharp tenaculum was applied to the anterior lip of the cervix for stabilization.  A sterile uterine sound was used to sound the uterus to a depth of 8.5cm.  A Mylex 23mm curette was used to sample the endometrium.  Sample was sent for pathologic examination.  Condition: Stable  Complications: None  Plan:  The patient was advised to call for any fever or  for prolonged or severe pain or bleeding. She was advised to use OTC acetaminophen and OTC ibuprofen as needed for mild to moderate pain. She was advised to avoid vaginal intercourse for 48 hours or until the bleeding has completely stopped.  Attending Physician Documentation: Brayton Mars, MD   ASSESSMENT: 1.  Abnormal uterine bleeding. 2.  Endometrial biopsy performed as preprocedure assessment of endometrial cavity prior to NovaSure ablation. 3.  Weight gain on Provera therapy. 4.  Hypertension, stable.  PLAN: 1.  Endometrial biopsy is completed today.  Following paracervical block. 2.  Continue with Provera 20 mg a day (decreased from 30 mg a day). 3.  Schedule hysteroscopy/D&C with NovaSure endometrial ablation within the next 2-6 weeks. 4.  Return for preop appointment. 5.  Notified by phone of results.  Alanda Slim Garey Alleva, MD  A total of 15 minutes were spent face-to-face with the patient during this encounter and over half of that time dealt with counseling and coordination of care.  Note: This dictation was prepared with Dragon dictation along with smaller phrase technology. Any transcriptional errors that result from this process are unintentional.

## 2015-08-27 LAB — PATHOLOGY

## 2015-09-05 ENCOUNTER — Telehealth: Payer: Self-pay | Admitting: Obstetrics and Gynecology

## 2015-09-05 NOTE — Telephone Encounter (Signed)
Pt called and dr Tennis Must put her on provera to stop bleeding and it did stop for about 3-4 days she is taking it 3 times a day, but the bleeding is back and she wanted to know if she needed to continue taking it or stop it, dr de wants to do an ablation on her but im not sure when that is schedule due to [facility] being out of work today. Pt would like a call back.

## 2015-09-06 NOTE — Telephone Encounter (Signed)
Pt aware per mad note. To take 20mg  of provera daily until hysteroscopy d&c and ablation. She states she is having a cycle now.  Not soaking a pad q 30 min to an hour. Monitor for now.

## 2015-09-13 ENCOUNTER — Encounter: Payer: Self-pay | Admitting: *Deleted

## 2015-09-13 ENCOUNTER — Ambulatory Visit (INDEPENDENT_AMBULATORY_CARE_PROVIDER_SITE_OTHER): Payer: 59 | Admitting: Obstetrics and Gynecology

## 2015-09-13 ENCOUNTER — Encounter: Payer: Self-pay | Admitting: Obstetrics and Gynecology

## 2015-09-13 ENCOUNTER — Other Ambulatory Visit: Payer: Self-pay

## 2015-09-13 VITALS — BP 125/84 | HR 106 | Ht 69.0 in | Wt 221.9 lb

## 2015-09-13 DIAGNOSIS — Z862 Personal history of diseases of the blood and blood-forming organs and certain disorders involving the immune mechanism: Secondary | ICD-10-CM

## 2015-09-13 DIAGNOSIS — N938 Other specified abnormal uterine and vaginal bleeding: Secondary | ICD-10-CM

## 2015-09-13 DIAGNOSIS — Z01818 Encounter for other preprocedural examination: Secondary | ICD-10-CM

## 2015-09-13 MED ORDER — IBUPROFEN 800 MG PO TABS: 800.0000 mg | ORAL_TABLET | Freq: Three times a day (TID) | ORAL | Status: DC | PRN

## 2015-09-13 NOTE — Patient Instructions (Signed)
  Your procedure is scheduled on: 09-19-15 [facility]) Report to [place] To find out your arrival time please call [phone] between 1PM - 3PM on 09-16-15 (FRIDAY)  Remember: Instructions that are not followed completely may result in serious medical risk, up to and including death, or upon the discretion of your surgeon and anesthesiologist your surgery may need to be rescheduled.    _X___ 1. Do not eat food or drink liquids after midnight. No gum chewing or hard candies.     _X___ 2. No Alcohol for 24 hours before or after surgery.   ____ 3. Bring all medications with you on the day of surgery if instructed.    _X___ 4. Notify your doctor if there is any change in your medical condition     (cold, fever, infections).     Do not wear jewelry, make-up, hairpins, clips or nail polish.  Do not wear lotions, powders, or perfumes. You may wear deodorant.  Do not shave 48 hours prior to surgery. Men may shave face and neck.  Do not bring valuables to the hospital.    [facility] is not responsible for any belongings or valuables.               Contacts, dentures or bridgework may not be worn into surgery.  Leave your suitcase in the car. After surgery it may be brought to your room.  For patients admitted to the hospital, discharge time is determined by your treatment team.   Patients discharged the day of surgery will not be allowed to drive home.   Please read over the following fact sheets that you were given:     _X___ Take these medicines the morning of surgery with A SIP OF WATER:    1. COREG   2. LOSARTAN  3.   4.  5.  6.  ____ Fleet Enema (as directed)   ____ Use CHG Soap as directed  ____ Use inhalers on the day of surgery  _X___ Stop metformin 2 days prior to surgery-LAST DOSE OF JENTADUETO ON Friday, MARCH 10TH.    ____ Take 1/2 of usual insulin dose the night before surgery and none on the morning of surgery.   ____ Stop  Coumadin/Plavix/aspirin-N/A  __X__ Stop Anti-inflammatories-STOP IBUPROFEN NOW-NO NSAIDS OR ASPIRIN PRODUCTS-TYLENOL OK TO TAKE   ____ Stop supplements until after surgery.    ____ Bring C-Pap to the hospital.

## 2015-09-13 NOTE — H&P (Signed)
Subjective:  PREOPERATIVE HISTORY AND PHYSICAL    Date of surgery: 09/19/2015 Chief complaint: 1.  Menorrhagia   Patient is a 50 y.o. G2P0059female scheduled for Hysteroscopy/D&C with NovaSure endometrial ablation. Indications for procedure are Menorrhagia.  08/18/2015.  Ultrasound-left fundal fibroid measuring 1.9 cm 08/24/2015.  Endometrial biopsy-Secretory endometrium without evidence of hyperplasia or carcinoma History of anaphylaxis during general anesthesia at [facility] in her 57s   Pertinent Gynecological Histor Patient's last menstrual period was 09/13/2015. Contraception: NA Last Pap: NA Last mammogram: NA  Menstrual History: OB History    Gravida Para Term Preterm AB TAB SAB Ectopic Multiple Living   2    2  2          Menarche age: Uncertain  Patient's last menstrual period was 09/13/2015.    Past Medical History  Diagnosis Date  . Irregular periods   . Diabetes mellitus without complication ([place])   . Hypertension   . Anemia   . Family history of adverse reaction to anesthesia     [place] UP  . Dysrhythmia     H/O TACHYCARDIA  . Complication of anesthesia     DURING SCAR TISSUE EXCISION FROM FALLOPIAN TUBES(2002), PT WAS GIVEN GENERAL ANESTHESIA AND PT BEGAN HAVING A REACTION TO THE ANESTHESIA AND LIPS AND TONGUE STARTED SWELLING AND THE SURGERY HAD TO BE STOPPED DUE TO AIRWAY CLOSING UP-PT HAD GA PRIOR TO THIS FOR HER GALLBLADDER WITH NO PROBLEM PREVIOUSLY    Past Surgical History  Procedure Laterality Date  . Gallbladder surgery      yrs ago  . Cholecystectomy    . Knee arthroscopy Left     OB History  Gravida Para Term Preterm AB SAB TAB Ectopic Multiple Living  2    2 2         # Outcome Date GA Lbr Len/2nd Weight Sex Delivery Anes PTL Lv  2 SAB           1 SAB               Social History   Social History  . Marital Status: Married    Spouse Name: N/A  . Number of Children: N/A  . Years of Education: N/A    Social History Main Topics  . Smoking status: Current Every Day Smoker -- 20 years    Types: Cigarettes  . Smokeless tobacco: Never Used     Comment: 2-3 CIGARETES,DAILY  . Alcohol Use: No  . Drug Use: No  . Sexual Activity: Yes    Birth Control/ Protection: None   Other Topics Concern  . None   Social History Narrative    Family History  Problem Relation Age of Onset  . Cancer Father     throat     (Not in a hospital admission)  Allergies  Allergen Reactions  . Lisinopril Swelling  . Wellbutrin [Bupropion] Hives and Swelling    Review of Systems Constitutional: No recent fever/chills/sweats Respiratory: No recent cough/bronchitis Cardiovascular: No chest pain Gastrointestinal: No recent nausea/vomiting/diarrhea Genitourinary: No UTI symptoms Hematologic/lymphatic:No history of coagulopathy or recent blood thinner use    Objective:    BP 125/84 mmHg  Pulse 106  Ht 5\' 9"  (1.753 m)  Wt 221 lb 14.4 oz (100.653 kg)  BMI 32.75 kg/m2  LMP 09/13/2015  General:   Normal  Skin:   normal  HEENT:  Normal  Neck:  Supple without Adenopathy or Thyromegaly  Lungs:   Heart:  Breasts:   Abdomen:  Pelvis:  M/S   Extremeties:  Neuro:    clear to auscultation bilaterally   Normal without murmur   Not Examined   soft, non-tender; bowel sounds normal; no masses,  no organomegaly   Exam deferred to OR  No CVAT  Warm/Dry   Normal          Assessment:      1.  Menorrhagia. 2.  Fibroid uterus, 2 cm, fundal On ultrasound 3.  History of benign endometrial biopsy 4.  History of anaphylaxis during surgical procedure.  Age 48.   Plan:  1.  Hysteroscopy/D&C with NovaSure endometrial ablation  Preoperative counseling: The patient is understanding of the planned procedure.  She is accepting of all surgical risks which include but are not limited to bleeding, infection, pelvic organ injury with need for repair, blood clot disorders, anesthesia risks,  etc.  All questions have been answered.  Informed consent is given.  Patient is ready and willing to proceed with surgery as scheduled.

## 2015-09-13 NOTE — Patient Instructions (Signed)
1. Return in 1 week for postop check after surgery 

## 2015-09-13 NOTE — Progress Notes (Signed)
Subjective:  PREOPERATIVE HISTORY AND PHYSICAL    Date of surgery: 09/19/2015 Chief complaint: 1.  Menorrhagia   Patient is a 50 y.o. G2P0045female scheduled for Hysteroscopy/D&C with NovaSure endometrial ablation. Indications for procedure are Menorrhagia.  08/18/2015.  Ultrasound-left fundal fibroid measuring 1.9 cm 08/24/2015.  Endometrial biopsy-Secretory endometrium without evidence of hyperplasia or carcinoma History of anaphylaxis during general anesthesia at [facility] in her 65s   Pertinent Gynecological Histor Patient's last menstrual period was 09/13/2015. Contraception: NA Last Pap: NA Last mammogram: NA  Menstrual History: OB History    Gravida Para Term Preterm AB TAB SAB Ectopic Multiple Living   2    2  2          Menarche age: Uncertain  Patient's last menstrual period was 09/13/2015.    Past Medical History  Diagnosis Date  . Irregular periods   . Diabetes mellitus without complication ([place])   . Hypertension   . Anemia   . Family history of adverse reaction to anesthesia     [place] UP  . Dysrhythmia     H/O TACHYCARDIA  . Complication of anesthesia     DURING SCAR TISSUE EXCISION FROM FALLOPIAN TUBES(2002), PT WAS GIVEN GENERAL ANESTHESIA AND PT BEGAN HAVING A REACTION TO THE ANESTHESIA AND LIPS AND TONGUE STARTED SWELLING AND THE SURGERY HAD TO BE STOPPED DUE TO AIRWAY CLOSING UP-PT HAD GA PRIOR TO THIS FOR HER GALLBLADDER WITH NO PROBLEM PREVIOUSLY    Past Surgical History  Procedure Laterality Date  . Gallbladder surgery      yrs ago  . Cholecystectomy    . Knee arthroscopy Left     OB History  Gravida Para Term Preterm AB SAB TAB Ectopic Multiple Living  2    2 2         # Outcome Date GA Lbr Len/2nd Weight Sex Delivery Anes PTL Lv  2 SAB           1 SAB               Social History   Social History  . Marital Status: Married    Spouse Name: N/A  . Number of Children: N/A  . Years of Education: N/A    Social History Main Topics  . Smoking status: Current Every Day Smoker -- 20 years    Types: Cigarettes  . Smokeless tobacco: Never Used     Comment: 2-3 CIGARETES,DAILY  . Alcohol Use: No  . Drug Use: No  . Sexual Activity: Yes    Birth Control/ Protection: None   Other Topics Concern  . None   Social History Narrative    Family History  Problem Relation Age of Onset  . Cancer Father     throat     (Not in a hospital admission)  Allergies  Allergen Reactions  . Lisinopril Swelling  . Wellbutrin [Bupropion] Hives and Swelling    Review of Systems Constitutional: No recent fever/chills/sweats Respiratory: No recent cough/bronchitis Cardiovascular: No chest pain Gastrointestinal: No recent nausea/vomiting/diarrhea Genitourinary: No UTI symptoms Hematologic/lymphatic:No history of coagulopathy or recent blood thinner use    Objective:    BP 125/84 mmHg  Pulse 106  Ht 5\' 9"  (1.753 m)  Wt 221 lb 14.4 oz (100.653 kg)  BMI 32.75 kg/m2  LMP 09/13/2015  General:   Normal  Skin:   normal  HEENT:  Normal  Neck:  Supple without Adenopathy or Thyromegaly  Lungs:   Heart:  Breasts:   Abdomen:  Pelvis:  M/S   Extremeties:  Neuro:    clear to auscultation bilaterally   Normal without murmur   Not Examined   soft, non-tender; bowel sounds normal; no masses,  no organomegaly   Exam deferred to OR  No CVAT  Warm/Dry   Normal          Assessment:      1.  Menorrhagia. 2.  Fibroid uterus, 2 cm, fundal On ultrasound 3.  History of benign endometrial biopsy 4.  History of anaphylaxis during surgical procedure.  Age 63.   Plan:  1.  Hysteroscopy/D&C with NovaSure endometrial ablation  Preoperative counseling: The patient is understanding of the planned procedure.  She is accepting of all surgical risks which include but are not limited to bleeding, infection, pelvic organ injury with need for repair, blood clot disorders, anesthesia risks,  etc.  All questions have been answered.  Informed consent is given.  Patient is ready and willing to proceed with surgery as scheduled.

## 2015-09-15 ENCOUNTER — Encounter
Admission: RE | Admit: 2015-09-15 | Discharge: 2015-09-15 | Disposition: A | Discharge status: Home / Self Care | Payer: 59 | Source: Ambulatory Visit | Attending: Obstetrics and Gynecology | Admitting: Obstetrics and Gynecology

## 2015-09-15 DIAGNOSIS — Z01812 Encounter for preprocedural laboratory examination: Secondary | ICD-10-CM | POA: Insufficient documentation

## 2015-09-15 DIAGNOSIS — Z01810 Encounter for preprocedural cardiovascular examination: Secondary | ICD-10-CM | POA: Diagnosis present

## 2015-09-15 LAB — CBC WITH DIFFERENTIAL/PLATELET
BASOS ABS: 0.1 10*3/uL (ref 0–0.1)
BASOS PCT: 1 %
EOS ABS: 0.2 10*3/uL (ref 0–0.7)
Eosinophils Relative: 3 %
HEMATOCRIT: 35.7 % (ref 35.0–47.0)
HEMOGLOBIN: 12.1 g/dL (ref 12.0–16.0)
Lymphocytes Relative: 22 %
Lymphs Abs: 2.1 10*3/uL (ref 1.0–3.6)
MCH: 30.1 pg (ref 26.0–34.0)
MCHC: 33.9 g/dL (ref 32.0–36.0)
MCV: 88.7 fL (ref 80.0–100.0)
MONOS PCT: 8 %
Monocytes Absolute: 0.7 10*3/uL (ref 0.2–0.9)
NEUTROS ABS: 6.6 10*3/uL — AB (ref 1.4–6.5)
NEUTROS PCT: 66 %
Platelets: 351 10*3/uL (ref 150–440)
RBC: 4.02 MIL/uL (ref 3.80–5.20)
RDW: 13.8 % (ref 11.5–14.5)
WBC: 9.8 10*3/uL (ref 3.6–11.0)

## 2015-09-15 LAB — RAPID HIV SCREEN (HIV 1/2 AB+AG)
HIV 1/2 ANTIBODIES: NONREACTIVE
HIV-1 P24 ANTIGEN - HIV24: NONREACTIVE

## 2015-09-15 LAB — BASIC METABOLIC PANEL
ANION GAP: 10 (ref 5–15)
BUN: 10 mg/dL (ref 6–20)
CALCIUM: 9.1 mg/dL (ref 8.9–10.3)
CO2: 21 mmol/L — AB (ref 22–32)
CREATININE: 0.77 mg/dL (ref 0.44–1.00)
Chloride: 107 mmol/L (ref 101–111)
Glucose, Bld: 299 mg/dL — ABNORMAL HIGH (ref 65–99)
Potassium: 3.4 mmol/L — ABNORMAL LOW (ref 3.5–5.1)
Sodium: 138 mmol/L (ref 135–145)

## 2015-09-16 LAB — RPR: RPR Ser Ql: NONREACTIVE

## 2015-09-16 NOTE — Pre-Procedure Instructions (Signed)
Called dr Kayleen Memos about glucose of 299-dr carroll states we will recheck glucose on am of surgery

## 2015-09-19 ENCOUNTER — Encounter: Payer: Self-pay | Admitting: *Deleted

## 2015-09-19 ENCOUNTER — Encounter
Admission: RE | Disposition: A | Discharge status: Home / Self Care | Payer: Self-pay | Source: Ambulatory Visit | Attending: Obstetrics and Gynecology

## 2015-09-19 ENCOUNTER — Ambulatory Visit: Payer: 59 | Admitting: Anesthesiology

## 2015-09-19 ENCOUNTER — Ambulatory Visit
Admission: RE | Admit: 2015-09-19 | Discharge: 2015-09-19 | Disposition: A | Discharge status: Home / Self Care | Payer: 59 | Source: Ambulatory Visit | Attending: Obstetrics and Gynecology | Admitting: Obstetrics and Gynecology

## 2015-09-19 DIAGNOSIS — N938 Other specified abnormal uterine and vaginal bleeding: Secondary | ICD-10-CM

## 2015-09-19 DIAGNOSIS — E119 Type 2 diabetes mellitus without complications: Secondary | ICD-10-CM | POA: Diagnosis not present

## 2015-09-19 DIAGNOSIS — Z800 Family history of malignant neoplasm of digestive organs: Secondary | ICD-10-CM | POA: Insufficient documentation

## 2015-09-19 DIAGNOSIS — N920 Excessive and frequent menstruation with regular cycle: Secondary | ICD-10-CM | POA: Diagnosis not present

## 2015-09-19 DIAGNOSIS — R000 Tachycardia, unspecified: Secondary | ICD-10-CM | POA: Insufficient documentation

## 2015-09-19 DIAGNOSIS — Z87892 Personal history of anaphylaxis: Secondary | ICD-10-CM | POA: Diagnosis not present

## 2015-09-19 DIAGNOSIS — F17210 Nicotine dependence, cigarettes, uncomplicated: Secondary | ICD-10-CM | POA: Insufficient documentation

## 2015-09-19 DIAGNOSIS — Z888 Allergy status to other drugs, medicaments and biological substances status: Secondary | ICD-10-CM | POA: Insufficient documentation

## 2015-09-19 DIAGNOSIS — D649 Anemia, unspecified: Secondary | ICD-10-CM | POA: Diagnosis not present

## 2015-09-19 DIAGNOSIS — Z9049 Acquired absence of other specified parts of digestive tract: Secondary | ICD-10-CM | POA: Insufficient documentation

## 2015-09-19 DIAGNOSIS — I10 Essential (primary) hypertension: Secondary | ICD-10-CM | POA: Diagnosis not present

## 2015-09-19 DIAGNOSIS — N939 Abnormal uterine and vaginal bleeding, unspecified: Secondary | ICD-10-CM | POA: Diagnosis not present

## 2015-09-19 DIAGNOSIS — Z8489 Family history of other specified conditions: Secondary | ICD-10-CM | POA: Insufficient documentation

## 2015-09-19 DIAGNOSIS — D259 Leiomyoma of uterus, unspecified: Secondary | ICD-10-CM | POA: Diagnosis not present

## 2015-09-19 HISTORY — DX: Cardiac arrhythmia, unspecified: I49.9

## 2015-09-19 HISTORY — DX: Adverse effect of unspecified anesthetic, initial encounter: T41.45XA

## 2015-09-19 HISTORY — DX: Anemia, unspecified: D64.9

## 2015-09-19 HISTORY — DX: Family history of other specified conditions: Z84.89

## 2015-09-19 HISTORY — PX: DILITATION & CURRETTAGE/HYSTROSCOPY WITH NOVASURE ABLATION: SHX5568

## 2015-09-19 LAB — GLUCOSE, CAPILLARY
Glucose-Capillary: 196 mg/dL — ABNORMAL HIGH (ref 65–99)
Glucose-Capillary: 192 mg/dL — ABNORMAL HIGH (ref 65–99)

## 2015-09-19 LAB — POCT PREGNANCY, URINE: Preg Test, Ur: NEGATIVE

## 2015-09-19 SURGERY — DILATATION & CURETTAGE/HYSTEROSCOPY WITH NOVASURE ABLATION
Anesthesia: General

## 2015-09-19 MED ORDER — CARVEDILOL 12.5 MG PO TABS
ORAL_TABLET | ORAL | Status: AC
  Filled 2015-09-19: qty 1

## 2015-09-19 MED ORDER — DEXAMETHASONE SODIUM PHOSPHATE 10 MG/ML IJ SOLN
INTRAMUSCULAR | Status: DC | PRN
  Administered 2015-09-19: 10 mg via INTRAVENOUS

## 2015-09-19 MED ORDER — OXYCODONE HCL 5 MG PO TABS
5.0000 mg | ORAL_TABLET | Freq: Once | ORAL | Status: AC | PRN
  Administered 2015-09-19: 5 mg via ORAL

## 2015-09-19 MED ORDER — ONDANSETRON HCL 4 MG/2ML IJ SOLN
INTRAMUSCULAR | Status: DC | PRN
  Administered 2015-09-19: 4 mg via INTRAVENOUS

## 2015-09-19 MED ORDER — OXYCODONE HCL 5 MG PO TABS
ORAL_TABLET | ORAL | Status: AC
  Filled 2015-09-19: qty 1

## 2015-09-19 MED ORDER — MIDAZOLAM HCL 2 MG/2ML IJ SOLN
INTRAMUSCULAR | Status: DC | PRN
Start: 2015-09-19 — End: 2015-09-19
  Administered 2015-09-19: 2 mg via INTRAVENOUS

## 2015-09-19 MED ORDER — FENTANYL CITRATE (PF) 100 MCG/2ML IJ SOLN
INTRAMUSCULAR | Status: AC
  Filled 2015-09-19: qty 2

## 2015-09-19 MED ORDER — DIPHENHYDRAMINE HCL 50 MG/ML IJ SOLN
INTRAMUSCULAR | Status: DC | PRN
  Administered 2015-09-19: 25 mg via INTRAVENOUS

## 2015-09-19 MED ORDER — OXYCODONE HCL 5 MG/5ML PO SOLN: 5.0000 mg | Freq: Once | ORAL | Status: AC | PRN

## 2015-09-19 MED ORDER — GLYCOPYRROLATE 0.2 MG/ML IJ SOLN
INTRAMUSCULAR | Status: DC | PRN
  Administered 2015-09-19: 0.2 mg via INTRAVENOUS

## 2015-09-19 MED ORDER — FENTANYL CITRATE (PF) 100 MCG/2ML IJ SOLN
25.0000 ug | INTRAMUSCULAR | Status: DC | PRN
  Administered 2015-09-19 (×2): 25 ug via INTRAVENOUS
  Administered 2015-09-19 (×2): 50 ug via INTRAVENOUS

## 2015-09-19 MED ORDER — OXYCODONE-ACETAMINOPHEN 5-325 MG PO TABS: 1.0000 | ORAL_TABLET | ORAL | Status: DC | PRN

## 2015-09-19 MED ORDER — SODIUM CHLORIDE 0.9 % IV SOLN
INTRAVENOUS | Status: DC
  Administered 2015-09-19: 10:00:00 via INTRAVENOUS
  Administered 2015-09-19: 50 mL/h via INTRAVENOUS

## 2015-09-19 MED ORDER — PROPOFOL 10 MG/ML IV BOLUS
INTRAVENOUS | Status: DC | PRN
  Administered 2015-09-19: 100 mg via INTRAVENOUS
  Administered 2015-09-19: 200 mg via INTRAVENOUS

## 2015-09-19 MED ORDER — FENTANYL CITRATE (PF) 100 MCG/2ML IJ SOLN
INTRAMUSCULAR | Status: DC | PRN
  Administered 2015-09-19 (×2): 50 ug via INTRAVENOUS

## 2015-09-19 MED ORDER — FAMOTIDINE 20 MG PO TABS
20.0000 mg | ORAL_TABLET | Freq: Once | ORAL | Status: AC
  Administered 2015-09-19: 20 mg via ORAL

## 2015-09-19 MED ORDER — CARVEDILOL 12.5 MG PO TABS
12.5000 mg | ORAL_TABLET | Freq: Once | ORAL | Status: AC
  Administered 2015-09-19: 12.5 mg via ORAL

## 2015-09-19 MED ORDER — DEXMEDETOMIDINE HCL 200 MCG/2ML IV SOLN
INTRAVENOUS | Status: DC | PRN
  Administered 2015-09-19: 8 ug via INTRAVENOUS
  Administered 2015-09-19: 12 ug via INTRAVENOUS

## 2015-09-19 MED ORDER — FAMOTIDINE 20 MG PO TABS
ORAL_TABLET | ORAL | Status: AC
  Administered 2015-09-19: 20 mg via ORAL
  Filled 2015-09-19: qty 1

## 2015-09-19 MED ORDER — LIDOCAINE HCL 2 % EX GEL
CUTANEOUS | Status: DC | PRN
  Administered 2015-09-19: 1 via TOPICAL

## 2015-09-19 SURGICAL SUPPLY — 12 items
CATH ROBINSON RED A/P 16FR (CATHETERS) ×3 IMPLANT
GLOVE BIO SURGEON STRL SZ8 (GLOVE) ×11 IMPLANT
GOWN STRL REUS W/ TWL LRG LVL3 (GOWN DISPOSABLE) ×1 IMPLANT
GOWN STRL REUS W/ TWL XL LVL3 (GOWN DISPOSABLE) ×1 IMPLANT
GOWN STRL REUS W/TWL LRG LVL3 (GOWN DISPOSABLE) ×3
GOWN STRL REUS W/TWL XL LVL3 (GOWN DISPOSABLE) ×3
IV LACTATED RINGERS 1000ML (IV SOLUTION) ×3 IMPLANT
KIT RM TURNOVER CYSTO AR (KITS) ×3 IMPLANT
NOVASURE ENDOMETRIAL ABLATION (MISCELLANEOUS) ×3 IMPLANT
PACK DNC HYST (MISCELLANEOUS) ×3 IMPLANT
PAD OB MATERNITY 4.3X12.25 (PERSONAL CARE ITEMS) ×3 IMPLANT
PAD PREP 24X41 OB/GYN DISP (PERSONAL CARE ITEMS) ×3 IMPLANT

## 2015-09-19 NOTE — Transfer of Care (Signed)
Immediate Anesthesia Transfer of Care Note  Patient: Michelle Marshall  Procedure(s) Performed: Procedure(s): DILATATION & CURETTAGE/HYSTEROSCOPY WITH NOVASURE ABLATION (N/A)  Patient Location: PACU  Anesthesia Type:General  Level of Consciousness: sedated  Airway & Oxygen Therapy: Patient Spontanous Breathing and Patient connected to face mask oxygen  Post-op Assessment: Report given to RN and Post -op Vital signs reviewed and stable  Post vital signs: Reviewed and stable  Last Vitals:  Filed Vitals:   09/19/15 0626 09/19/15 0847  BP: 151/109 129/85  Pulse: 85   Temp: 37.1 C 36.3 C  Resp: 16 22    Complications: No apparent anesthesia complications

## 2015-09-19 NOTE — Anesthesia Postprocedure Evaluation (Signed)
Anesthesia Post Note  Patient: Michelle Marshall  Procedure(s) Performed: Procedure(s) (LRB): DILATATION & CURETTAGE/HYSTEROSCOPY WITH NOVASURE ABLATION (N/A)  Patient location during evaluation: PACU Anesthesia Type: General Level of consciousness: awake and alert Pain management: pain level controlled Vital Signs Assessment: post-procedure vital signs reviewed and stable Respiratory status: spontaneous breathing, nonlabored ventilation, respiratory function stable and patient connected to nasal cannula oxygen Cardiovascular status: blood pressure returned to baseline and stable Postop Assessment: no signs of nausea or vomiting Anesthetic complications: no    Last Vitals:  Filed Vitals:   09/19/15 1002 09/19/15 1022  BP: 144/75 155/82  Pulse: 56 62  Temp: 37.2 C   Resp: 12 12    Last Pain:  Filed Vitals:   09/19/15 1025  PainSc: 5                  Joseph K Piscitello

## 2015-09-19 NOTE — Discharge Instructions (Signed)
General Anesthesia, Adult °General anesthesia is a sleep-like state of non-feeling produced by medicines (anesthetics). General anesthesia prevents you from being alert and feeling pain during a medical procedure. Your caregiver may recommend general anesthesia if your procedure: °· Is long. °· Is painful or uncomfortable. °· Would be frightening to see or hear. °· Requires you to be still. °· Affects your breathing. °· Causes significant blood loss. °LET YOUR CAREGIVER KNOW ABOUT: °· Allergies to food or medicine. °· Medicines taken, including vitamins, herbs, eyedrops, over-the-counter medicines, and creams. °· Use of steroids (by mouth or creams). °· Previous problems with anesthetics or numbing medicines, including problems experienced by relatives. °· History of bleeding problems or blood clots. °· Previous surgeries and types of anesthetics received. °· Possibility of pregnancy, if this applies. °· Use of cigarettes, alcohol, or illegal drugs. °· Any health condition(s), especially diabetes, sleep apnea, and high blood pressure. °RISKS AND COMPLICATIONS °General anesthesia rarely causes complications. However, if complications do occur, they can be life threatening. Complications include: °· A lung infection. °· A stroke. °· A heart attack. °· Waking up during the procedure. When this occurs, the patient may be unable to move and communicate that he or she is awake. The patient may feel severe pain. °Older adults and adults with serious medical problems are more likely to have complications than adults who are young and healthy. Some complications can be prevented by answering all of your caregiver's questions thoroughly and by following all pre-procedure instructions. It is important to tell your caregiver if any of the pre-procedure instructions, especially those related to diet, were not followed. Any food or liquid in the stomach can cause problems when you are under general anesthesia. °BEFORE THE  PROCEDURE °· Ask your caregiver if you will have to spend the night at the hospital. If you will not have to spend the night, arrange to have an adult drive you and stay with you for 24 hours. °· Follow your caregiver's instructions if you are taking dietary supplements or medicines. Your caregiver may tell you to stop taking them or to reduce your dosage. °· Do not smoke for as long as possible before your procedure. If possible, stop smoking 3-6 weeks before the procedure. °· Do not take new dietary supplements or medicines within 1 week of your procedure unless your caregiver approves them. °· Do not eat within 8 hours of your procedure or as directed by your caregiver. Drink only clear liquids, such as water, black coffee (without milk or cream), and fruit juices (without pulp). °· Do not drink within 3 hours of your procedure or as directed by your caregiver. °· You may brush your teeth on the morning of the procedure, but make sure to spit out the toothpaste and water when finished. °PROCEDURE  °You will receive anesthetics through a mask, through an intravenous (IV) access tube, or through both. A doctor who specializes in anesthesia (anesthesiologist) or a nurse who specializes in anesthesia (nurse anesthetist) or both will stay with you throughout the procedure to make sure you remain unconscious. He or she will also watch your blood pressure, pulse, and oxygen levels to make sure that the anesthetics do not cause any problems. Once you are asleep, a breathing tube or mask may be used to help you breathe. °AFTER THE PROCEDURE °You will wake up after the procedure is complete. You may be in the room where the procedure was performed or in a recovery area. You may have a sore throat   if a breathing tube was used. You may also feel: °· Dizzy. °· Weak. °· Drowsy. °· Confused. °· Nauseous. °· Cold. °These are all normal responses and can be expected to last for up to 24 hours after the procedure is complete. A  caregiver will tell you when you are ready to go home. This will usually be when you are fully awake and in stable condition. °  °This information is not intended to replace advice given to you by your health care provider. Make sure you discuss any questions you have with your health care provider. °  °Document Released: 10/02/2007 Document Revised: 07/16/2014 Document Reviewed: 10/24/2011 °Elsevier Interactive Patient Education ©2016 Elsevier Inc. ° °

## 2015-09-19 NOTE — Op Note (Signed)
OPERATIVE NOTE  Date of surgery: 09/19/2015  Preoperative diagnosis:  1. Menorrhagia  Postoperative diagnosis:  1. Menorrhagia  Operative procedure: 1. Hysteroscopy 2. Dilation and curettage of endometrium 3. NovaSure endometrial ablation  Surgeon: Dr. Zipporah Plants Assistant: Luz Lex  Anesthesia: Gen., LMA Findings:Hysteroscopy demonstrated no intrauterine pathology Pelvic: Gynecoid  Uterus: sounded 10 cm Normal appearing endometrial cavity without gross lesions.  Description of procedure Patient was brought to the operating room where she was placed in supine position. General anesthesia was induced without difficulty using the LMA technique. The patient was placed in dorsal lithotomy position using candy cane stirrups. A betadine perineal intravaginal prep and drape was performed in standard fashion. Weighted speculum was placed in the vagina. Single-tooth tenaculum was placed on the anterior cervix. Uterus was sounded to10cm. Hanks dilators were used up to a #20 [place] caliber to dilate the endocervical canal. The [facility] hysteroscope using lactated Ringer's as irrigant was used for the hysteroscopy. The above-noted findings were photo documented. The hysteroscope removed. The smooth and serrated curettes were then used to curettage the endometrial cavity with production of minimal  tissue. Stone polyp forceps were used to grasp residual tissue left behind. Repeat hysteroscopy was performed and demonstrated excellent sampling. (Endocervical sampling demonstrated the endocervical length to the 4 cm. The NovaSure endometrial ablation was then performed in standard fashion. The instrument was placed into the uterine cavity and deployed the cervical width was 4.5 cm. After appropriate cavity test, the instrument was engaged and the ablation was performed over 68 seconds. Upon completion of the procedure the instrument was removed; repeat hysteroscopy demonstrated an excellent char  effect.  Procedure was then terminated with all instrumentation being removed from the vagina. The patient was then awakened mobilized and taken to the recovery room in satisfactory condition.  IV fluids:1000 mL. Urine output:100 mL. EBL: Less than 15 Instruments, needles, and sponge counts were verified as correct.  Addendum: Patient had history of anaphylaxis with general anesthesia at [facility] in the 1990's; etiology unclear; patient was pretreated with Decadron and Benadryl and had no incidents intraoperatively during the Gen. anesthesia LMA technique If patient requires hysterectomy, LAVH or transvaginal hysterectomy may be considered.  Brayton Mars, MD 09/19/2015

## 2015-09-19 NOTE — Anesthesia Procedure Notes (Signed)
Procedure Name: LMA Insertion Date/Time: 09/19/2015 7:48 AM Performed by: Doreen Salvage Pre-anesthesia Checklist: Patient identified, Patient being monitored, Timeout performed, Emergency Drugs available and Suction available Patient Re-evaluated:Patient Re-evaluated prior to inductionOxygen Delivery Method: Circle system utilized Preoxygenation: Pre-oxygenation with 100% oxygen Intubation Type: IV induction Ventilation: Mask ventilation without difficulty LMA: LMA inserted LMA Size: 4.5 Tube type: Oral Number of attempts: 1 Placement Confirmation: positive ETCO2 and breath sounds checked- equal and bilateral Tube secured with: Tape Dental Injury: Teeth and Oropharynx as per pre-operative assessment

## 2015-09-19 NOTE — Anesthesia Preprocedure Evaluation (Addendum)
Anesthesia Evaluation  Patient identified by MRN, date of birth, ID band Patient awake    Reviewed: Allergy & Precautions, H&P , NPO status , Patient's Chart, lab work & pertinent test results  History of Anesthesia Complications (+) Family history of anesthesia reaction and history of anesthetic complications  Airway Mallampati: III  TM Distance: >3 FB Neck ROM: limited    Dental  (+) Poor Dentition   Pulmonary neg shortness of breath, former smoker,    Pulmonary exam normal breath sounds clear to auscultation       Cardiovascular Exercise Tolerance: Good hypertension, (-) angina(-) Past MI and (-) DOE Normal cardiovascular exam+ dysrhythmias  Rhythm:regular Rate:Normal     Neuro/Psych negative neurological ROS  negative psych ROS   GI/Hepatic negative GI ROS, Neg liver ROS, neg GERD  ,  Endo/Other  diabetes, Type 2  Renal/GU negative Renal ROS  negative genitourinary   Musculoskeletal   Abdominal   Peds  Hematology negative hematology ROS (+) anemia ,   Anesthesia Other Findings Past Medical History:   Irregular periods                                            Diabetes mellitus without complication (HCC)                 Hypertension                                                 Anemia                                                       Family history of adverse reaction to anesthesia*                Comment:BROTHER-HARD TIME WAKING UP   Dysrhythmia                                                    Comment:H/O TACHYCARDIA   Complication of anesthesia                                     Comment:DURING SCAR TISSUE EXCISION FROM FALLOPIAN               TUBES(2002), PT WAS GIVEN GENERAL ANESTHESIA               AND PT BEGAN HAVING A REACTION TO THE               ANESTHESIA AND LIPS AND TONGUE STARTED SWELLING              AND THE SURGERY HAD TO BE STOPPED DUE TO AIRWAY              CLOSING UP-PT HAD GA  PRIOR TO THIS FOR HER  GALLBLADDER WITH NO PROBLEM PREVIOUSLY  Past Surgical History:   GALLBLADDER SURGERY                                             Comment:yrs ago   CHOLECYSTECTOMY                                               KNEE ARTHROSCOPY                                Left              JOINT REPLACEMENT                               Left                Comment:30 years ago  BMI    Body Mass Index   32.62 kg/m 2    Patient reports multiple GAs with no problems, however, she states that with last GA, 10 years ago, her face and tongue became swollen.  That the procedure had to be halted, but then it was done later that day. Swelling had resolved by emergence of first procedure. She was told that sitting up helped resolve the swelling.  She was told that the anesthesia caused this reaction.  Reports no temperature increase of any other symptoms concerning for MH.  Concern for possible paralytic, local anesthetic or antibiotic allergy.  Reproductive/Obstetrics negative OB ROS                           Anesthesia Physical Anesthesia Plan  ASA: III  Anesthesia Plan: General LMA   Post-op Pain Management:    Induction:   Airway Management Planned:   Additional Equipment:   Intra-op Plan:   Post-operative Plan:   Informed Consent: I have reviewed the patients History and Physical, chart, labs and discussed the procedure including the risks, benefits and alternatives for the proposed anesthesia with the patient or authorized representative who has indicated his/her understanding and acceptance.   Dental Advisory Given  Plan Discussed with: Anesthesiologist, CRNA and Surgeon  Anesthesia Plan Comments: (Patient informed that they are higher risk for complications from anesthesia during this procedure due to their medical history.  Patient voiced understanding.  Plan to avoid paralytics and local anesthetics.  Plan to pretreate with  benadryl. )       Anesthesia Quick Evaluation

## 2015-09-19 NOTE — Interval H&P Note (Signed)
History and Physical Interval Note:  09/19/2015 7:34 AM  Michelle Marshall  has presented today for surgery, with the diagnosis of DUB, UTERINE LEIOMYOMA, AUB  The various methods of treatment have been discussed with the patient and family. After consideration of risks, benefits and other options for treatment, the patient has consented to  Procedure(s): [place] (N/A) as a surgical intervention .  The patient's history has been reviewed, patient examined, no change in status, stable for surgery.  I have reviewed the patient's chart and labs.  Questions were answered to the patient's satisfaction.     Hassell Done A Chistian Kasler

## 2015-09-19 NOTE — H&P (View-Only) (Signed)
Subjective:  PREOPERATIVE HISTORY AND PHYSICAL    Date of surgery: 09/19/2015 Chief complaint: 1.  Menorrhagia   Patient is a 50 y.o. G2P0027female scheduled for Hysteroscopy/D&C with NovaSure endometrial ablation. Indications for procedure are Menorrhagia.  08/18/2015.  Ultrasound-left fundal fibroid measuring 1.9 cm 08/24/2015.  Endometrial biopsy-Secretory endometrium without evidence of hyperplasia or carcinoma History of anaphylaxis during general anesthesia at [facility] in her 49s   Pertinent Gynecological Histor Patient's last menstrual period was 09/13/2015. Contraception: NA Last Pap: NA Last mammogram: NA  Menstrual History: OB History    Gravida Para Term Preterm AB TAB SAB Ectopic Multiple Living   2    2  2          Menarche age: Uncertain  Patient's last menstrual period was 09/13/2015.    Past Medical History  Diagnosis Date  . Irregular periods   . Diabetes mellitus without complication ([place])   . Hypertension   . Anemia   . Family history of adverse reaction to anesthesia     [place] UP  . Dysrhythmia     H/O TACHYCARDIA  . Complication of anesthesia     DURING SCAR TISSUE EXCISION FROM FALLOPIAN TUBES(2002), PT WAS GIVEN GENERAL ANESTHESIA AND PT BEGAN HAVING A REACTION TO THE ANESTHESIA AND LIPS AND TONGUE STARTED SWELLING AND THE SURGERY HAD TO BE STOPPED DUE TO AIRWAY CLOSING UP-PT HAD GA PRIOR TO THIS FOR HER GALLBLADDER WITH NO PROBLEM PREVIOUSLY    Past Surgical History  Procedure Laterality Date  . Gallbladder surgery      yrs ago  . Cholecystectomy    . Knee arthroscopy Left     OB History  Gravida Para Term Preterm AB SAB TAB Ectopic Multiple Living  2    2 2         # Outcome Date GA Lbr Len/2nd Weight Sex Delivery Anes PTL Lv  2 SAB           1 SAB               Social History   Social History  . Marital Status: Married    Spouse Name: N/A  . Number of Children: N/A  . Years of Education: N/A    Social History Main Topics  . Smoking status: Current Every Day Smoker -- 20 years    Types: Cigarettes  . Smokeless tobacco: Never Used     Comment: 2-3 CIGARETES,DAILY  . Alcohol Use: No  . Drug Use: No  . Sexual Activity: Yes    Birth Control/ Protection: None   Other Topics Concern  . None   Social History Narrative    Family History  Problem Relation Age of Onset  . Cancer Father     throat     (Not in a hospital admission)  Allergies  Allergen Reactions  . Lisinopril Swelling  . Wellbutrin [Bupropion] Hives and Swelling    Review of Systems Constitutional: No recent fever/chills/sweats Respiratory: No recent cough/bronchitis Cardiovascular: No chest pain Gastrointestinal: No recent nausea/vomiting/diarrhea Genitourinary: No UTI symptoms Hematologic/lymphatic:No history of coagulopathy or recent blood thinner use    Objective:    BP 125/84 mmHg  Pulse 106  Ht 5\' 9"  (1.753 m)  Wt 221 lb 14.4 oz (100.653 kg)  BMI 32.75 kg/m2  LMP 09/13/2015  General:   Normal  Skin:   normal  HEENT:  Normal  Neck:  Supple without Adenopathy or Thyromegaly  Lungs:   Heart:  Breasts:   Abdomen:  Pelvis:  M/S   Extremeties:  Neuro:    clear to auscultation bilaterally   Normal without murmur   Not Examined   soft, non-tender; bowel sounds normal; no masses,  no organomegaly   Exam deferred to OR  No CVAT  Warm/Dry   Normal          Assessment:      1.  Menorrhagia. 2.  Fibroid uterus, 2 cm, fundal On ultrasound 3.  History of benign endometrial biopsy 4.  History of anaphylaxis during surgical procedure.  Age 50.   Plan:  1.  Hysteroscopy/D&C with NovaSure endometrial ablation  Preoperative counseling: The patient is understanding of the planned procedure.  She is accepting of all surgical risks which include but are not limited to bleeding, infection, pelvic organ injury with need for repair, blood clot disorders, anesthesia risks,  etc.  All questions have been answered.  Informed consent is given.  Patient is ready and willing to proceed with surgery as scheduled.

## 2015-09-20 LAB — SURGICAL PATHOLOGY

## 2015-09-28 ENCOUNTER — Encounter: Payer: Self-pay | Admitting: Obstetrics and Gynecology

## 2015-09-28 ENCOUNTER — Ambulatory Visit (INDEPENDENT_AMBULATORY_CARE_PROVIDER_SITE_OTHER): Payer: 59 | Admitting: Obstetrics and Gynecology

## 2015-09-28 VITALS — BP 171/99 | HR 118 | Ht 69.0 in | Wt 226.8 lb

## 2015-09-28 DIAGNOSIS — Z9889 Other specified postprocedural states: Secondary | ICD-10-CM

## 2015-09-28 DIAGNOSIS — Z09 Encounter for follow-up examination after completed treatment for conditions other than malignant neoplasm: Secondary | ICD-10-CM

## 2015-10-01 ENCOUNTER — Other Ambulatory Visit: Payer: Self-pay | Admitting: Obstetrics and Gynecology

## 2015-10-02 NOTE — Patient Instructions (Signed)
1.  Resume activities as tolerated with the exception of intercourse. 2.  Return in 3 months for follow-up on bleeding

## 2015-10-02 NOTE — Progress Notes (Signed)
Chief complaint: 1.  One week postoperative check. 2.  Status post hysteroscopy/D&C with NovaSure endometrial ablation.  Patient is doing well postoperatively.  One week after surgery.  Bowel and bladder function are normal.  She is not having any significant pelvic pain.  She does experience mild discharge without bleeding.  Pathology: Endometrium negative for hyperplasia and carcinoma  OBJECTIVE: BP 171/99 mmHg  Pulse 118  Ht 5\' 9"  (1.753 m)  Wt 226 lb 12.8 oz (102.876 kg)  BMI 33.48 kg/m2  LMP 09/13/2015 Physical examination deferred.  ASSESSMENT: 1.  Normal postop check 1 week status post laparoscopy/D&C with NovaSure endometrial ablation.  PLAN: 1.  Resume activities as tolerated with the exception of intercourse for another 3 weeks. 2.  Monitor bleeding with menstrual calendar. 3.  Return in 6 months for follow-up. 4.  Patient understands that if she does not have effective management of her bleeding, LAVH may be considered in the future.  Brayton Mars, MD  Note: This dictation was prepared with Dragon dictation along with smaller phrase technology. Any transcriptional errors that result from this process are unintentional.

## 2015-10-11 ENCOUNTER — Other Ambulatory Visit: Payer: Self-pay | Admitting: Obstetrics and Gynecology

## 2015-10-13 ENCOUNTER — Encounter: Payer: Self-pay | Admitting: Primary Care

## 2015-10-13 ENCOUNTER — Ambulatory Visit (INDEPENDENT_AMBULATORY_CARE_PROVIDER_SITE_OTHER): Payer: 59 | Admitting: Primary Care

## 2015-10-13 VITALS — BP 144/82 | HR 80 | Temp 97.5°F | Ht 69.0 in | Wt 226.4 lb

## 2015-10-13 DIAGNOSIS — G4700 Insomnia, unspecified: Secondary | ICD-10-CM | POA: Diagnosis not present

## 2015-10-13 DIAGNOSIS — E119 Type 2 diabetes mellitus without complications: Secondary | ICD-10-CM | POA: Diagnosis not present

## 2015-10-13 DIAGNOSIS — I10 Essential (primary) hypertension: Secondary | ICD-10-CM | POA: Diagnosis not present

## 2015-10-13 MED ORDER — TRAZODONE HCL 50 MG PO TABS: 25.0000 mg | ORAL_TABLET | Freq: Every day | ORAL | Status: DC

## 2015-10-13 MED ORDER — GLIPIZIDE ER 5 MG PO TB24: 5.0000 mg | ORAL_TABLET | Freq: Every day | ORAL | Status: DC

## 2015-10-13 MED ORDER — LOSARTAN POTASSIUM 50 MG PO TABS: 50.0000 mg | ORAL_TABLET | Freq: Every day | ORAL | Status: DC

## 2015-10-13 NOTE — Assessment & Plan Note (Signed)
Long history of, once managed on Alprazolam and Ambien, did not like these. She's failed numerous OTC treatment. Will have her start low dose Trazodone HS. Discussed dosing. Will follow up in 2 weeks.

## 2015-10-13 NOTE — Assessment & Plan Note (Signed)
Endorses A1C of 7.0 in December 2016. Poor diet overall, discussed importance for improving. Managed on Glipizide XL 5 mg and linagliptin-metformin 2.11/998 mg BID. A1C and foot exam in 2 weeks. Will obtain records.

## 2015-10-13 NOTE — Progress Notes (Signed)
Pre visit review using our clinic review tool, if applicable. No additional management support is needed unless otherwise documented below in the visit note. 

## 2015-10-13 NOTE — Patient Instructions (Signed)
Start Losartan 50 mg tablets for high blood pressure. Take 1 tablet by mouth every morning. You may take 2 of the 25 mg tablets from your current bottle until empty. I sent a new prescription to your pharmacy for the 50 mg tablets.  Check your blood pressure daily, around the same time of day, for the next 2 weeks.   Ensure that you have rested for 30 minutes prior to checking your blood pressure. Record your readings and bring them to your next visit.  Start Trazodone tablets for insomnia. Start by taking 1/2 tablet by mouth 1 hour prior to sleep. If no improvement, may take the second half. May advance to 1 full tablet at bedtime if no improvement with 1/2 tablet. We will re-evaluate this in 2 weeks.  Schedule a follow up appointment in 2 weeks for re-check of your blood pressure levels.  It was a pleasure to meet you today! Please don't hesitate to call me with any questions. Welcome to [facility]!

## 2015-10-13 NOTE — Assessment & Plan Note (Signed)
Above goal in office today, also on numerous recent GYN visits. Continue Carvedilol 12.5 mg, increase losartan to 50 mg once daily. Follow up in 2 weeks for re-evaluation. BMP next visit.

## 2015-10-13 NOTE — Progress Notes (Signed)
Subjective:    Patient ID: Michelle Marshall, female    DOB: [birth year], 50 y.o.   MRN: [id number]  HPI  Michelle Marshall is a 50 year old female who presents today to establish care and discuss the problems mentioned below. Will obtain old records. Her last physical was in Fall of 2016.  1) Essential Hypertension: Diagnosed years ago. Currently managed on carvedilol 12.5 mg BID and losartan 25 mg daily. BP slightly above goal in clinic today at 144/82. She does not check her BP at home but has had multiple elevated readings recently at her GYN office (171/99, 155/82, 147/96). Denies chest pain, headaches, dizziness.   2) Type 2 Diabetes: Diagnosed 10 years ago. Currently managed on Glipizide XL 5 mg once daily and linagliptin-metformin 2.11-998 mg tablets BID. Her last A1C was at 7.0 in December 2016. She endorses a fair diet.  Breakfast: Bacon, egg, cheese croissant, fruit. Lunch: Meat, vegetables, bread, dessert Dinner: Eats out fast food Snack: None Desserts: 7 days weekly, cookies Beverages: Coffee, unsweet tea, limited to no water.  Exercise: She does not currently exercise.   3) Insomnia: Present for years. She has difficulty falling and staying asleep. She also experiences daytime tiredness. Denies snoring. Once seeing PCP in [facility] and was managed on Alprazolam and then Ambien. She didn't feel as though it helped with sleep and also caused odd dreams. She's tried Melatonin, Tylenol PM, Motrin PM, and Benadryl without improvement.   Review of Systems  Constitutional: Negative for unexpected weight change.  Respiratory: Negative for shortness of breath.   Cardiovascular: Negative for chest pain.  Endocrine: Negative for polyuria.  Skin: Negative for color change.  Neurological: Negative for dizziness, numbness and headaches.  Psychiatric/Behavioral: Positive for sleep disturbance.       Denies concerns for depression or anxiety       Past Medical History  Diagnosis  Date  . Irregular periods   . Diabetes mellitus without complication ([place])   . Hypertension   . Anemia   . Family history of adverse reaction to anesthesia     [place] UP  . Dysrhythmia     H/O TACHYCARDIA  . Complication of anesthesia     DURING SCAR TISSUE EXCISION FROM FALLOPIAN TUBES(2002), PT WAS GIVEN GENERAL ANESTHESIA AND PT BEGAN HAVING A REACTION TO THE ANESTHESIA AND LIPS AND TONGUE STARTED SWELLING AND THE SURGERY HAD TO BE STOPPED DUE TO AIRWAY CLOSING UP-PT HAD GA PRIOR TO THIS FOR HER GALLBLADDER WITH NO PROBLEM PREVIOUSLY    Social History   Social History  . Marital Status: Married    Spouse Name: N/A  . Number of Children: N/A  . Years of Education: N/A   Occupational History  . Not on file.   Social History Main Topics  . Smoking status: Current Some Day Smoker -- 20 years    Types: Cigarettes    Last Attempt to Quit: 09/05/2015  . Smokeless tobacco: Never Used     Comment: 2-3 CIGARETES,DAILY. trying to quit....was using chantrix  . Alcohol Use: No  . Drug Use: No  . Sexual Activity: Yes    Birth Control/ Protection: None   Other Topics Concern  . Not on file   Social History Narrative   Married.   1 child.   She works as a Designer, industrial/product   Enjoys shopping, traveling.     Past Surgical History  Procedure Laterality Date  . Cholecystectomy    . Knee arthroscopy Left   .  Joint replacement Left     30 years ago  . Dilitation & currettage/hystroscopy with novasure ablation N/A 09/19/2015    Procedure: DILATATION & CURETTAGE/HYSTEROSCOPY WITH NOVASURE ABLATION;  Surgeon: Brayton Mars, MD;  Location: [facility] ORS;  Service: Gynecology;  Laterality: N/A;    Family History  Problem Relation Age of Onset  . Cancer Father     throat  . Hypertension Mother   . Diabetes Paternal Grandmother     Allergies  Allergen Reactions  . Lisinopril Swelling  . Wellbutrin [Bupropion] Hives and Swelling    Current Outpatient  Prescriptions on File Prior to Visit  Medication Sig Dispense Refill  . carvedilol (COREG) 12.5 MG tablet Take 12.5 mg by mouth 2 (two) times daily with a meal.    . EPINEPHrine (EPIPEN IJ) Inject as directed as needed.    Marland Kitchen ibuprofen (ADVIL,MOTRIN) 800 MG tablet TAKE 1 TABLET(800 MG) BY MOUTH EVERY 8 HOURS AS NEEDED 30 tablet 0  . Linagliptin-Metformin HCl (JENTADUETO) 2.11-998 MG TABS Take 1 tablet by mouth 2 (two) times daily.     . Multiple Vitamin (MULTIVITAMIN) capsule Take 1 capsule by mouth daily.     No current facility-administered medications on file prior to visit.    BP 144/82 mmHg  Pulse 80  Temp(Src) 97.5 F (36.4 C) (Oral)  Ht 5\' 9"  (1.753 m)  Wt 226 lb 6.4 oz (102.694 kg)  BMI 33.42 kg/m2  SpO2 95%    Objective:   Physical Exam  Constitutional: She appears well-nourished.  Neck: Neck supple.  Cardiovascular: Normal rate and regular rhythm.   Pulmonary/Chest: Effort normal and breath sounds normal.  Skin: Skin is warm and dry.  Psychiatric: She has a normal mood and affect.          Assessment & Plan:

## 2015-10-14 ENCOUNTER — Other Ambulatory Visit: Payer: Self-pay | Admitting: Obstetrics and Gynecology

## 2015-10-24 ENCOUNTER — Encounter: Payer: Self-pay | Admitting: Primary Care

## 2015-10-27 ENCOUNTER — Ambulatory Visit (INDEPENDENT_AMBULATORY_CARE_PROVIDER_SITE_OTHER): Payer: 59 | Admitting: Primary Care

## 2015-10-27 ENCOUNTER — Encounter: Payer: Self-pay | Admitting: Primary Care

## 2015-10-27 VITALS — BP 144/84 | HR 80 | Temp 98.2°F | Ht 69.0 in | Wt 228.8 lb

## 2015-10-27 DIAGNOSIS — G4700 Insomnia, unspecified: Secondary | ICD-10-CM

## 2015-10-27 DIAGNOSIS — I10 Essential (primary) hypertension: Secondary | ICD-10-CM

## 2015-10-27 DIAGNOSIS — E119 Type 2 diabetes mellitus without complications: Secondary | ICD-10-CM | POA: Diagnosis not present

## 2015-10-27 LAB — LIPID PANEL
CHOLESTEROL: 108 mg/dL (ref 0–200)
HDL: 45 mg/dL (ref >39.00)
LDL CALC: 46 mg/dL (ref 0–99)
NonHDL: 62.7
Total CHOL/HDL Ratio: 2
Triglycerides: 83 mg/dL (ref 0.0–149.0)
VLDL: 16.6 mg/dL (ref 0.0–40.0)

## 2015-10-27 LAB — BASIC METABOLIC PANEL
BUN: 10 mg/dL (ref 6–23)
CHLORIDE: 105 meq/L (ref 96–112)
CO2: 26 mEq/L (ref 19–32)
CREATININE: 0.75 mg/dL (ref 0.40–1.20)
Calcium: 9 mg/dL (ref 8.4–10.5)
GFR: 105.79 mL/min (ref >60.00)
Glucose, Bld: 158 mg/dL — ABNORMAL HIGH (ref 70–99)
Potassium: 3.6 mEq/L (ref 3.5–5.1)
Sodium: 139 mEq/L (ref 135–145)

## 2015-10-27 LAB — HEMOGLOBIN A1C: Hgb A1c MFr Bld: 8.3 % — ABNORMAL HIGH (ref 4.6–6.5)

## 2015-10-27 MED ORDER — LOSARTAN POTASSIUM 100 MG PO TABS: 100.0000 mg | ORAL_TABLET | Freq: Every day | ORAL | Status: DC

## 2015-10-27 NOTE — Patient Instructions (Addendum)
We've increased your Losartan to 100 mg. You may take 2 of the 50 mg tablets until your bottle is empty. I've sent in the 100 mg tablets, so ensure you are only taking 1 tablet once you receive this prescription.   You may take 100 mg of the Trazodone. E-mail me through My Chart and notify me if no improvement.   Limit processed carbohydrates, sweets, fast food.  Increase lean protein, vegetables, whole grains, fruit, water.  Complete lab work prior to leaving today. I will notify you of your results once received.   It was a pleasure to see you today!  Diabetes Mellitus and Food It is important for you to manage your blood sugar (glucose) level. Your blood glucose level can be greatly affected by what you eat. Eating healthier foods in the appropriate amounts throughout the day at about the same time each day will help you control your blood glucose level. It can also help slow or prevent worsening of your diabetes mellitus. Healthy eating may even help you improve the level of your blood pressure and reach or maintain a healthy weight.  General recommendations for healthful eating and cooking habits include:  Eating meals and snacks regularly. Avoid going long periods of time without eating to lose weight.  Eating a diet that consists mainly of plant-based foods, such as fruits, vegetables, nuts, legumes, and whole grains.  Using low-heat cooking methods, such as baking, instead of high-heat cooking methods, such as deep frying. Work with your dietitian to make sure you understand how to use the Nutrition Facts information on food labels. HOW CAN FOOD AFFECT ME? Carbohydrates Carbohydrates affect your blood glucose level more than any other type of food. Your dietitian will help you determine how many carbohydrates to eat at each meal and teach you how to count carbohydrates. Counting carbohydrates is important to keep your blood glucose at a healthy level, especially if you are using  insulin or taking certain medicines for diabetes mellitus. Alcohol Alcohol can cause sudden decreases in blood glucose (hypoglycemia), especially if you use insulin or take certain medicines for diabetes mellitus. Hypoglycemia can be a life-threatening condition. Symptoms of hypoglycemia (sleepiness, dizziness, and disorientation) are similar to symptoms of having too much alcohol.  If your health care provider has given you approval to drink alcohol, do so in moderation and use the following guidelines:  Women should not have more than one drink per day, and men should not have more than two drinks per day. One drink is equal to:  12 oz of beer.  5 oz of wine.  1 oz of hard liquor.  Do not drink on an empty stomach.  Keep yourself hydrated. Have water, diet soda, or unsweetened iced tea.  Regular soda, juice, and other mixers might contain a lot of carbohydrates and should be counted. WHAT FOODS ARE NOT RECOMMENDED? As you make food choices, it is important to remember that all foods are not the same. Some foods have fewer nutrients per serving than other foods, even though they might have the same number of calories or carbohydrates. It is difficult to get your body what it needs when you eat foods with fewer nutrients. Examples of foods that you should avoid that are high in calories and carbohydrates but low in nutrients include:  Trans fats (most processed foods list trans fats on the Nutrition Facts label).  Regular soda.  Juice.  Candy.  Sweets, such as cake, pie, doughnuts, and cookies.  Fried foods.  WHAT FOODS CAN I EAT? Eat nutrient-rich foods, which will nourish your body and keep you healthy. The food you should eat also will depend on several factors, including:  The calories you need.  The medicines you take.  Your weight.  Your blood glucose level.  Your blood pressure level.  Your cholesterol level. You should eat a variety of foods,  including:  Protein.  Lean cuts of meat.  Proteins low in saturated fats, such as fish, egg whites, and beans. Avoid processed meats.  Fruits and vegetables.  Fruits and vegetables that may help control blood glucose levels, such as apples, mangoes, and yams.  Dairy products.  Choose fat-free or low-fat dairy products, such as milk, yogurt, and cheese.  Grains, bread, pasta, and rice.  Choose whole grain products, such as multigrain bread, whole oats, and brown rice. These foods may help control blood pressure.  Fats.  Foods containing healthful fats, such as nuts, avocado, olive oil, canola oil, and fish. DOES EVERYONE WITH DIABETES MELLITUS HAVE THE SAME MEAL PLAN? Because every person with diabetes mellitus is different, there is not one meal plan that works for everyone. It is very important that you meet with a dietitian who will help you create a meal plan that is just right for you.   This information is not intended to replace advice given to you by your health care provider. Make sure you discuss any questions you have with your health care provider.   Document Released: 03/22/2005 Document Revised: 07/16/2014 Document Reviewed: 05/22/2013 Elsevier Interactive Patient Education [employer].

## 2015-10-27 NOTE — Progress Notes (Signed)
Subjective:    Patient ID: Michelle Marshall, female    DOB: 1966/06/27, 50 y.o.   MRN: [id number]  HPI  Michelle Marshall is a 50 year old female who presents today for follow up of hypertension. She was evaluated 2 weeks ago as a new patient and noted to have several elevated BP readings despite current treatment on antihypertensives. We continued her Carvedilol 12.5 mg BID and increased her Losartan to 50 mg once daily.  Since her last visit her BP is slightly improved, but running 140's/70-80;s and is 144/84 in the clinic today. Denies chest pain, dizziness, headaches. She's not really working to improve her diet as of yet, but is more motivated.   2) Type 2 Diabetes: Currently managed on Linagliptin-Metformin 2.11/998 mg and Glipizide XL 5 mg. Due for A1C, Lipids, and foot exam today.   3) Insomnia: Initiated on Trazodone 25-50 mg tablets last visit. She's had little improvement in sleep as she will lay awake for 2-3 hours prior to falling asleep.    Review of Systems  Respiratory: Negative for shortness of breath.   Cardiovascular: Negative for chest pain.  Neurological: Negative for dizziness, numbness and headaches.  Psychiatric/Behavioral: Positive for sleep disturbance. The patient is not nervous/anxious.        Past Medical History  Diagnosis Date  . Irregular periods   . Diabetes mellitus without complication ([place])   . Hypertension   . Anemia   . Family history of adverse reaction to anesthesia     [place] UP  . Dysrhythmia     H/O TACHYCARDIA  . Complication of anesthesia     DURING SCAR TISSUE EXCISION FROM FALLOPIAN TUBES(2002), PT WAS GIVEN GENERAL ANESTHESIA AND PT BEGAN HAVING A REACTION TO THE ANESTHESIA AND LIPS AND TONGUE STARTED SWELLING AND THE SURGERY HAD TO BE STOPPED DUE TO AIRWAY CLOSING UP-PT HAD GA PRIOR TO THIS FOR HER GALLBLADDER WITH NO PROBLEM PREVIOUSLY  . Vitamin D deficiency      Social History   Social History  . Marital  Status: Married    Spouse Name: N/A  . Number of Children: N/A  . Years of Education: N/A   Occupational History  . Not on file.   Social History Main Topics  . Smoking status: Current Some Day Smoker -- 20 years    Types: Cigarettes    Last Attempt to Quit: 09/05/2015  . Smokeless tobacco: Never Used     Comment: 2-3 CIGARETES,DAILY. trying to quit....was using chantrix  . Alcohol Use: No  . Drug Use: No  . Sexual Activity: Yes    Birth Control/ Protection: None   Other Topics Concern  . Not on file   Social History Narrative   Married.   1 child.   She works as a Designer, industrial/product   Enjoys shopping, traveling.     Past Surgical History  Procedure Laterality Date  . Cholecystectomy    . Knee arthroscopy Left   . Joint replacement Left     30 years ago  . Dilitation & currettage/hystroscopy with novasure ablation N/A 09/19/2015    Procedure: DILATATION & CURETTAGE/HYSTEROSCOPY WITH NOVASURE ABLATION;  Surgeon: Brayton Mars, MD;  Location: [facility] ORS;  Service: Gynecology;  Laterality: N/A;    Family History  Problem Relation Age of Onset  . Cancer Father     throat  . Hypertension Mother   . Diabetes Paternal Grandmother     Allergies  Allergen Reactions  . Lisinopril Swelling  .  Wellbutrin [Bupropion] Hives and Swelling    Current Outpatient Prescriptions on File Prior to Visit  Medication Sig Dispense Refill  . carvedilol (COREG) 12.5 MG tablet Take 12.5 mg by mouth 2 (two) times daily with a meal.    . EPINEPHrine (EPIPEN IJ) Inject as directed as needed.    Marland Kitchen glipiZIDE (GLIPIZIDE XL) 5 MG 24 hr tablet Take 1 tablet (5 mg total) by mouth daily with breakfast. 30 tablet 5  . ibuprofen (ADVIL,MOTRIN) 800 MG tablet TAKE 1 TABLET(800 MG) BY MOUTH EVERY 8 HOURS AS NEEDED 30 tablet 0  . Linagliptin-Metformin HCl (JENTADUETO) 2.11-998 MG TABS Take 1 tablet by mouth 2 (two) times daily.     . Multiple Vitamin (MULTIVITAMIN) capsule Take 1 capsule by mouth  daily.    . traZODone (DESYREL) 50 MG tablet Take 0.5-1 tablets (25-50 mg total) by mouth at bedtime. 30 tablet 3   No current facility-administered medications on file prior to visit.    BP 144/84 mmHg  Pulse 80  Temp(Src) 98.2 F (36.8 C) (Oral)  Ht 5\' 9"  (1.753 m)  Wt 228 lb 12.8 oz (103.783 kg)  BMI 33.77 kg/m2  SpO2 97%     Objective:   Physical Exam  Constitutional: She appears well-nourished.  Cardiovascular: Normal rate and regular rhythm.   Pulmonary/Chest: Effort normal and breath sounds normal.  Skin: Skin is warm and dry.          Assessment & Plan:

## 2015-10-27 NOTE — Progress Notes (Signed)
Pre visit review using our clinic review tool, if applicable. No additional management support is needed unless otherwise documented below in the visit note. 

## 2015-10-27 NOTE — Assessment & Plan Note (Signed)
A1C and Lipids due today. Foot exam unremarkable. 3 different sources provided today regarding diabetic diet. Labs pending.

## 2015-10-27 NOTE — Assessment & Plan Note (Signed)
Slight improvement with diastolic pressure, overall not much change. Increase losartan to 100 mg daily. Continue Carvedilol 12.5 mg BID. BMP pending.

## 2015-10-27 NOTE — Assessment & Plan Note (Signed)
Little improvement with Trazodone 50 mg.  Will have her increase dose to 100 mg daily. She is to email me through My Chart with an update.

## 2015-10-30 ENCOUNTER — Other Ambulatory Visit: Payer: Self-pay | Admitting: Primary Care

## 2015-10-30 DIAGNOSIS — E119 Type 2 diabetes mellitus without complications: Secondary | ICD-10-CM

## 2015-10-30 DIAGNOSIS — I10 Essential (primary) hypertension: Secondary | ICD-10-CM

## 2015-10-30 MED ORDER — LINAGLIPTIN-METFORMIN HCL 2.5-1000 MG PO TABS: 1.0000 | ORAL_TABLET | Freq: Two times a day (BID) | ORAL | Status: DC

## 2015-10-30 MED ORDER — CARVEDILOL 12.5 MG PO TABS: 12.5000 mg | ORAL_TABLET | Freq: Two times a day (BID) | ORAL | Status: DC

## 2015-10-30 MED ORDER — GLIPIZIDE ER 10 MG PO TB24: 10.0000 mg | ORAL_TABLET | Freq: Every day | ORAL | Status: DC

## 2015-11-10 ENCOUNTER — Telehealth: Payer: Self-pay | Admitting: Primary Care

## 2015-11-10 DIAGNOSIS — I10 Essential (primary) hypertension: Secondary | ICD-10-CM

## 2015-11-10 NOTE — Telephone Encounter (Signed)
-----   Message from Pleas Koch, NP sent at 10/27/2015  7:42 AM EDT ----- Regarding: BP Will you please check on Michelle Marshall's BP readings since we increased her Losartan to 100 mg?

## 2015-11-10 NOTE — Telephone Encounter (Signed)
Message left for patient to return my call.  

## 2015-11-10 NOTE — Telephone Encounter (Signed)
Patient returned Chan's call. °

## 2015-11-11 MED ORDER — HYDROCHLOROTHIAZIDE 25 MG PO TABS: 25.0000 mg | ORAL_TABLET | Freq: Every day | ORAL | Status: DC

## 2015-11-11 NOTE — Telephone Encounter (Signed)
Called patient. She stated that BP runs around 140/78. However, she does notice some dizziness but it does not last long and it does not happen every day.

## 2015-11-11 NOTE — Telephone Encounter (Signed)
Message left for patient to return my call.  

## 2015-11-11 NOTE — Telephone Encounter (Signed)
Her blood pressure should be less than 140/90, so if it's running in the [id number] systolic then she needs another BP medication.  Please verify these readings. I've sent in a prescription for HCTZ 25 mg for high blood pressure. She is to take 1 tablet by mouth everyday. Please schedule her for follow up of hypertension in 2 weeks.

## 2015-11-15 NOTE — Telephone Encounter (Signed)
Called and notified patient of Kate's comments. Patient verbalized understanding.  Also patient stated that she discuss with Anda Kraft regarding her trazodone dosage. She said that she was told that she can take 1 to 2 tablets if needed for sleep. Patient wants to know if we can change the dosage for the next refill. Please advise.

## 2015-11-15 NOTE — Telephone Encounter (Signed)
Sure, how many tables is she taking to achieve sleep? 1 or 2?

## 2015-11-16 NOTE — Telephone Encounter (Signed)
Left message on voicemail for patient to call back. 

## 2015-11-17 NOTE — Telephone Encounter (Signed)
Patient is taking 2 tablets to achieve sleep.

## 2015-11-18 ENCOUNTER — Other Ambulatory Visit: Payer: Self-pay | Admitting: Primary Care

## 2015-11-18 DIAGNOSIS — G4700 Insomnia, unspecified: Secondary | ICD-10-CM

## 2015-11-18 MED ORDER — TRAZODONE HCL 100 MG PO TABS: ORAL_TABLET | ORAL | Status: DC

## 2015-11-18 NOTE — Telephone Encounter (Signed)
Noted and refill sent.

## 2015-11-25 ENCOUNTER — Telehealth: Payer: Self-pay

## 2015-11-25 DIAGNOSIS — I10 Essential (primary) hypertension: Secondary | ICD-10-CM

## 2015-11-25 MED ORDER — AMLODIPINE BESYLATE 10 MG PO TABS: 10.0000 mg | ORAL_TABLET | Freq: Every day | ORAL | Status: DC

## 2015-11-25 NOTE — Telephone Encounter (Signed)
Noted. Do not take Losartan. I would like to try Amlodipine 10 mg for high blood pressure. Take 1 tablet by mouth every day. Please have her call me with BP readings in 2 weeks.

## 2015-11-25 NOTE — Telephone Encounter (Signed)
Pt left v/m; pt had allergic reaction to losartan; pt has been taking med for approx 1 year; pt said mouth was swollen, rash all over body with itching; [facility] & advised to cut losartan to 50 mg instead of 100 mg and pt took methylprednisone ; mouth swelling is gone but still has rash  On body; pt has decided to stop losartan completely.  pt request different med to [facility] [address]. Pt request cb.

## 2015-11-25 NOTE — Telephone Encounter (Signed)
Pt notified as instructed and voiced understanding. 

## 2015-12-09 ENCOUNTER — Telehealth: Payer: Self-pay | Admitting: Primary Care

## 2015-12-09 NOTE — Telephone Encounter (Signed)
-----   Message from Pleas Koch, NP sent at 11/25/2015  4:19 PM EDT ----- Regarding: BP Please check on Ms. Reever's BP readings. She had a reaction to Losartan. We changed to Amlodipine. How's her BP?

## 2015-12-09 NOTE — Telephone Encounter (Signed)
Message left for patient to return my call.  

## 2015-12-12 NOTE — Telephone Encounter (Signed)
Message left for patient to return my call.  

## 2015-12-14 NOTE — Telephone Encounter (Signed)
Message left for patient to return my call.  

## 2015-12-15 NOTE — Telephone Encounter (Signed)
Will close encounter since patient did not call back.

## 2015-12-29 ENCOUNTER — Ambulatory Visit: Payer: 59 | Admitting: Obstetrics and Gynecology

## 2016-01-25 ENCOUNTER — Ambulatory Visit (INDEPENDENT_AMBULATORY_CARE_PROVIDER_SITE_OTHER): Payer: 59 | Admitting: Obstetrics and Gynecology

## 2016-01-25 ENCOUNTER — Encounter: Payer: Self-pay | Admitting: Obstetrics and Gynecology

## 2016-01-25 VITALS — BP 121/77 | HR 93 | Ht 69.0 in | Wt 229.1 lb

## 2016-01-25 DIAGNOSIS — Z9889 Other specified postprocedural states: Secondary | ICD-10-CM | POA: Insufficient documentation

## 2016-01-25 DIAGNOSIS — N912 Amenorrhea, unspecified: Secondary | ICD-10-CM

## 2016-01-25 HISTORY — DX: Amenorrhea, unspecified: N91.2

## 2016-01-25 HISTORY — DX: Other specified postprocedural states: Z98.890

## 2016-01-25 NOTE — Patient Instructions (Signed)
1. Maintain routine gynecologic care with primary care 2. Follow-up as needed if abnormal uterine bleeding or pelvic pain DEVELOPS

## 2016-01-25 NOTE — Progress Notes (Signed)
Chief complaint: 1. Abnormal uterine bleeding follow-up  Patient presents for evaluation status post endometrial ablation. She had approximately 7 weeks with spotting with subsequent cessation of bleeding. She has not had any further bleeding since that postoperative time period. She does not have any significant pelvic cramping or pelvic pain.  OBJECTIVE: BP 121/77 mmHg  Pulse 93  Ht 5\' 9"  (1.753 m)  Wt 229 lb 1.6 oz (103.919 kg)  BMI 33.82 kg/m2 Pleasant well-appearing female in no acute distress Abdomen: Soft, nontender without organomegaly Pelvic exam: External genitalia normal BUS-normal Vagina-normal Cervix-normal Uterus-midplane, normal size and shape, nontender Adnexa-nontender Rectovaginal-normal external exam  ASSESSMENT: 1. Amenorrhea status post NovaSure endometrial ablation, procedure successful  PLAN: 1. Maintain routine gynecologic care with primary care 2. Follow-up as needed if abnormal uterine bleeding or pelvic pain develops.  A total of 15 minutes were spent face-to-face with the patient during this encounter and over half of that time dealt with counseling and coordination of care.  Brayton Mars, MD  Note: This dictation was prepared with Dragon dictation along with smaller phrase technology. Any transcriptional errors that result from this process are unintentional.

## 2016-03-23 ENCOUNTER — Other Ambulatory Visit: Payer: Self-pay | Admitting: Primary Care

## 2016-03-23 DIAGNOSIS — I10 Essential (primary) hypertension: Secondary | ICD-10-CM

## 2016-03-24 ENCOUNTER — Other Ambulatory Visit: Payer: Self-pay | Admitting: Primary Care

## 2016-03-24 DIAGNOSIS — I10 Essential (primary) hypertension: Secondary | ICD-10-CM

## 2016-04-28 ENCOUNTER — Other Ambulatory Visit: Payer: Self-pay | Admitting: Primary Care

## 2016-04-28 DIAGNOSIS — I10 Essential (primary) hypertension: Secondary | ICD-10-CM

## 2016-05-04 ENCOUNTER — Other Ambulatory Visit: Payer: Self-pay | Admitting: Primary Care

## 2016-05-04 ENCOUNTER — Encounter: Payer: Self-pay | Admitting: Primary Care

## 2016-05-04 ENCOUNTER — Ambulatory Visit (INDEPENDENT_AMBULATORY_CARE_PROVIDER_SITE_OTHER): Payer: 59 | Admitting: Primary Care

## 2016-05-04 VITALS — BP 146/92 | HR 88 | Temp 97.7°F | Ht 69.0 in | Wt 232.0 lb

## 2016-05-04 DIAGNOSIS — I10 Essential (primary) hypertension: Secondary | ICD-10-CM

## 2016-05-04 DIAGNOSIS — F411 Generalized anxiety disorder: Secondary | ICD-10-CM

## 2016-05-04 DIAGNOSIS — E119 Type 2 diabetes mellitus without complications: Secondary | ICD-10-CM

## 2016-05-04 LAB — MICROALBUMIN / CREATININE URINE RATIO
Creatinine,U: 214.2 mg/dL
Microalb Creat Ratio: 0.9 mg/g (ref 0.0–30.0)
Microalb, Ur: 1.9 mg/dL (ref 0.0–1.9)

## 2016-05-04 LAB — HEMOGLOBIN A1C: Hgb A1c MFr Bld: 9.1 % — ABNORMAL HIGH (ref 4.6–6.5)

## 2016-05-04 MED ORDER — ESCITALOPRAM OXALATE 10 MG PO TABS: 10.0000 mg | ORAL_TABLET | Freq: Every day | ORAL | 1 refills | Status: DC

## 2016-05-04 NOTE — Assessment & Plan Note (Signed)
Home readings stable.  Suspect could be related to increased anxiety/irritability. Will continue to monitor.

## 2016-05-04 NOTE — Assessment & Plan Note (Signed)
Due for repeat A1C today, last A1C of 8.3. Will need to add in injectable medications if A1C worse or uncontrolled. Urine microalbumin pending.

## 2016-05-04 NOTE — Progress Notes (Signed)
Pre visit review using our clinic review tool, if applicable. No additional management support is needed unless otherwise documented below in the visit note. 

## 2016-05-04 NOTE — Progress Notes (Signed)
Subjective:    Patient ID: Michelle Marshall, female    DOB: December 25, 1965, 50 y.o.   MRN: [id number]  HPI  Michelle Marshall is a 50 year old female who presents today for follow up and a chief complaint of irritability.  1) Type 2 Diabetes: Currently managed on Glipizide XL 10 mg and Linagliptin-Metformin 2.11/998 mg BID. Her last A1C was 8.3 in April 2017. Her Glipizide was increased to 10 mg last visit given her A1C result. She has noticed increased cravings and weight gain since she quit smoking.   She endorses a poor diet: Breakfast: Oatmeal Lunch: Fast food, restaurants Dinner: [employer], restaurants Snacks: Candy Desserts: 4 times daily Beverages: Twist, un-sweet tea, little to no water  Wt Readings from Last 3 Encounters:  05/04/16 232 lb (105.2 kg)  01/25/16 229 lb 1.6 oz (103.9 kg)  10/27/15 228 lb 12.8 oz (103.8 kg)     2) Essential Hypertension: Currently managed on Amlodipine 10 mg, carvedilol 12.5 mg, and HCTZ 25 mg. She's been checking her BP at home which is running 130-140's/80's. She denies chest pain, dizziness, visual changes.  3) Anxiety: History of intermittent anxiety for years. Over the past 4 months she's had increased anxiety/irritability. She will snap at her co-workers and has "a short fuse". GAD 7 score of 17 today. She was previously prescribed on Alprazolam but stopped taking it as it caused her to feel drowsy. She is not currently managed on anything for her symptoms.  Review of Systems  Eyes: Negative for visual disturbance.  Respiratory: Negative for shortness of breath.   Cardiovascular: Negative for chest pain.  Neurological: Negative for dizziness, numbness and headaches.  Psychiatric/Behavioral: Negative for sleep disturbance and suicidal ideas. The patient is nervous/anxious.        Past Medical History:  Diagnosis Date  . Anemia   . Complication of anesthesia    DURING SCAR TISSUE EXCISION FROM FALLOPIAN TUBES(2002), PT WAS GIVEN GENERAL  ANESTHESIA AND PT BEGAN HAVING A REACTION TO THE ANESTHESIA AND LIPS AND TONGUE STARTED SWELLING AND THE SURGERY HAD TO BE STOPPED DUE TO AIRWAY CLOSING UP-PT HAD GA PRIOR TO THIS FOR HER GALLBLADDER WITH NO PROBLEM PREVIOUSLY  . Diabetes mellitus without complication ([place])   . Dysrhythmia    H/O TACHYCARDIA  . Family history of adverse reaction to anesthesia    [place] UP  . Hypertension   . Irregular periods   . Vitamin D deficiency      Social History   Social History  . Marital status: Married    Spouse name: N/A  . Number of children: N/A  . Years of education: N/A   Occupational History  . Not on file.   Social History Main Topics  . Smoking status: Former Smoker    Years: 20.00    Types: Cigarettes    Quit date: 09/05/2015  . Smokeless tobacco: Never Used     Comment: 2-3 CIGARETES,DAILY. trying to quit....was using chantrix  . Alcohol use No  . Drug use: No  . Sexual activity: Yes    Birth control/ protection: None   Other Topics Concern  . Not on file   Social History Narrative   Married.   1 child.   She works as a Designer, industrial/product   Enjoys shopping, traveling.     Past Surgical History:  Procedure Laterality Date  . CHOLECYSTECTOMY    . DILITATION & CURRETTAGE/HYSTROSCOPY WITH NOVASURE ABLATION N/A 09/19/2015   Procedure: DILATATION & CURETTAGE/HYSTEROSCOPY  WITH NOVASURE ABLATION;  Surgeon: Brayton Mars, MD;  Location: [facility] ORS;  Service: Gynecology;  Laterality: N/A;  . JOINT REPLACEMENT Left    30 years ago  . KNEE ARTHROSCOPY Left     Family History  Problem Relation Age of Onset  . Cancer Father     throat  . Hypertension Mother   . Diabetes Paternal Grandmother     Allergies  Allergen Reactions  . Lisinopril Swelling  . Losartan Other (See Comments)    Mouth swollen, rash and itching  . Wellbutrin [Bupropion] Hives and Swelling    Current Outpatient Prescriptions on File Prior to Visit  Medication Sig Dispense  Refill  . amLODipine (NORVASC) 10 MG tablet TAKE 1 TABLET(10 MG) BY MOUTH DAILY 30 tablet 1  . carvedilol (COREG) 12.5 MG tablet Take 1 tablet (12.5 mg total) by mouth 2 (two) times daily with a meal. 180 tablet 2  . EPINEPHrine (EPIPEN IJ) Inject as directed as needed.    . hydrochlorothiazide (HYDRODIURIL) 25 MG tablet TAKE 1 TABLET BY MOUTH DAILY 30 tablet 5  . ibuprofen (ADVIL,MOTRIN) 800 MG tablet TAKE 1 TABLET(800 MG) BY MOUTH EVERY 8 HOURS AS NEEDED 30 tablet 0  . Linagliptin-Metformin HCl (JENTADUETO) 2.11-998 MG TABS Take 1 tablet by mouth 2 (two) times daily. 180 tablet 2  . Multiple Vitamin (MULTIVITAMIN) capsule Take 1 capsule by mouth daily.    . traZODone (DESYREL) 100 MG tablet Take 1 tablet by mouth at bedtime as needed for sleep. 30 tablet 5   No current facility-administered medications on file prior to visit.     BP (!) 146/92   Pulse 88   Temp 97.7 F (36.5 C) (Oral)   Ht 5\' 9"  (1.753 m)   Wt 232 lb (105.2 kg)   SpO2 96%   BMI 34.26 kg/m    Objective:   Physical Exam  Constitutional: She is oriented to person, place, and time. She appears well-nourished.  Neck: Neck supple.  Cardiovascular: Normal rate and regular rhythm.   Pulmonary/Chest: Effort normal and breath sounds normal.  Neurological: She is alert and oriented to person, place, and time.  Skin: Skin is warm and dry.  Psychiatric: She has a normal mood and affect.          Assessment & Plan:

## 2016-05-04 NOTE — Patient Instructions (Signed)
Complete lab work prior to leaving today. I will notify you of your results once received.   Start Lexapro 10 mg tablets. Take 1/2 tablet by mouth daily for 8 days, then increase to 1 full tablet thereafter.  It is important that you improve your diet. Please limit carbohydrates in the form of white bread, rice, pasta, sweets, fast food, fried food, sugary drinks, etc. Increase your consumption of fresh fruits and vegetables, whole grains, lean protein.  Ensure you are consuming 64 ounces of water daily.  Start exercising. You should be getting 150 minutes of moderate intensity exercise weekly.  Follow up in 6 weeks for re-evaluation of anxiety and irritability.  It was a pleasure to see you today!  Diabetes Mellitus and Food It is important for you to manage your blood sugar (glucose) level. Your blood glucose level can be greatly affected by what you eat. Eating healthier foods in the appropriate amounts throughout the day at about the same time each day will help you control your blood glucose level. It can also help slow or prevent worsening of your diabetes mellitus. Healthy eating may even help you improve the level of your blood pressure and reach or maintain a healthy weight.  General recommendations for healthful eating and cooking habits include:  Eating meals and snacks regularly. Avoid going long periods of time without eating to lose weight.  Eating a diet that consists mainly of plant-based foods, such as fruits, vegetables, nuts, legumes, and whole grains.  Using low-heat cooking methods, such as baking, instead of high-heat cooking methods, such as deep frying. Work with your dietitian to make sure you understand how to use the Nutrition Facts information on food labels. HOW CAN FOOD AFFECT ME? Carbohydrates Carbohydrates affect your blood glucose level more than any other type of food. Your dietitian will help you determine how many carbohydrates to eat at each meal and  teach you how to count carbohydrates. Counting carbohydrates is important to keep your blood glucose at a healthy level, especially if you are using insulin or taking certain medicines for diabetes mellitus. Alcohol Alcohol can cause sudden decreases in blood glucose (hypoglycemia), especially if you use insulin or take certain medicines for diabetes mellitus. Hypoglycemia can be a life-threatening condition. Symptoms of hypoglycemia (sleepiness, dizziness, and disorientation) are similar to symptoms of having too much alcohol.  If your health care provider has given you approval to drink alcohol, do so in moderation and use the following guidelines:  Women should not have more than one drink per day, and men should not have more than two drinks per day. One drink is equal to:  12 oz of beer.  5 oz of wine.  1 oz of hard liquor.  Do not drink on an empty stomach.  Keep yourself hydrated. Have water, diet soda, or unsweetened iced tea.  Regular soda, juice, and other mixers might contain a lot of carbohydrates and should be counted. WHAT FOODS ARE NOT RECOMMENDED? As you make food choices, it is important to remember that all foods are not the same. Some foods have fewer nutrients per serving than other foods, even though they might have the same number of calories or carbohydrates. It is difficult to get your body what it needs when you eat foods with fewer nutrients. Examples of foods that you should avoid that are high in calories and carbohydrates but low in nutrients include:  Trans fats (most processed foods list trans fats on the Nutrition Facts label).  Regular soda.  Juice.  Candy.  Sweets, such as cake, pie, doughnuts, and cookies.  Fried foods. WHAT FOODS CAN I EAT? Eat nutrient-rich foods, which will nourish your body and keep you healthy. The food you should eat also will depend on several factors, including:  The calories you need.  The medicines you take.  Your  weight.  Your blood glucose level.  Your blood pressure level.  Your cholesterol level. You should eat a variety of foods, including:  Protein.  Lean cuts of meat.  Proteins low in saturated fats, such as fish, egg whites, and beans. Avoid processed meats.  Fruits and vegetables.  Fruits and vegetables that may help control blood glucose levels, such as apples, mangoes, and yams.  Dairy products.  Choose fat-free or low-fat dairy products, such as milk, yogurt, and cheese.  Grains, bread, pasta, and rice.  Choose whole grain products, such as multigrain bread, whole oats, and brown rice. These foods may help control blood pressure.  Fats.  Foods containing healthful fats, such as nuts, avocado, olive oil, canola oil, and fish. DOES EVERYONE WITH DIABETES MELLITUS HAVE THE SAME MEAL PLAN? Because every person with diabetes mellitus is different, there is not one meal plan that works for everyone. It is very important that you meet with a dietitian who will help you create a meal plan that is just right for you.   This information is not intended to replace advice given to you by your health care provider. Make sure you discuss any questions you have with your health care provider.   Document Released: 03/22/2005 Document Revised: 07/16/2014 Document Reviewed: 05/22/2013 Elsevier Interactive Patient Education [employer].

## 2016-05-04 NOTE — Assessment & Plan Note (Signed)
History of in the past and managed on Alprazolam. GAD 7 score of 17 today. Long discussion today regarding treatment options, she elects for medication and declines therapy.  Start Lexapro 10 mg tablets. Patient is to take 1/2 tablet daily for 8 days, then advance to 1 full tablet thereafter. We discussed possible side effects of headache, GI upset, drowsiness, and SI/HI. If thoughts of SI/HI develop, we discussed to present to the emergency immediately. Patient verbalized understanding.   Follow up in 6 weeks for re-evaluation.

## 2016-05-11 ENCOUNTER — Other Ambulatory Visit: Payer: Self-pay | Admitting: Primary Care

## 2016-05-11 DIAGNOSIS — E119 Type 2 diabetes mellitus without complications: Secondary | ICD-10-CM

## 2016-05-11 MED ORDER — CANAGLIFLOZIN 100 MG PO TABS: 100.0000 mg | ORAL_TABLET | Freq: Every day | ORAL | 1 refills | Status: DC

## 2016-05-14 ENCOUNTER — Other Ambulatory Visit: Payer: Self-pay | Admitting: Primary Care

## 2016-05-14 DIAGNOSIS — I10 Essential (primary) hypertension: Secondary | ICD-10-CM

## 2016-05-15 NOTE — Telephone Encounter (Signed)
Ok to refill? Electronically refill request for   amLODipine (NORVASC) 10 MG tablet  Last prescribed on 03/26/2016.

## 2016-05-25 ENCOUNTER — Other Ambulatory Visit: Payer: Self-pay | Admitting: Primary Care

## 2016-05-25 DIAGNOSIS — G4700 Insomnia, unspecified: Secondary | ICD-10-CM

## 2016-05-25 NOTE — Telephone Encounter (Signed)
Ok to refill? Electronically refill request for   traZODone (DESYREL) 100 MG tablet  Last prescribed on 11/18/2015. Last seen on 05/04/2016.

## 2016-06-06 ENCOUNTER — Other Ambulatory Visit: Payer: Self-pay | Admitting: Primary Care

## 2016-06-06 DIAGNOSIS — I10 Essential (primary) hypertension: Secondary | ICD-10-CM

## 2016-06-08 ENCOUNTER — Telehealth: Payer: Self-pay

## 2016-06-08 NOTE — Telephone Encounter (Signed)
Pt left v/m requesting cb about additional questions about mammogram films that were sent to [facility]. I called pt and she got a letter that she needs additional testing; pt had mammogram last week. Pt request cb to find out what the issue or problem was and with which breast. Pt had mammogram at [facility]ograms across from [facility] on 05/29/16. Pt request cb.

## 2016-06-08 NOTE — Telephone Encounter (Signed)
I have not received her mammogram results and have no information. It would be best for her to call the [facility] mammogram center or whoever sent her the letter for more information.

## 2016-06-08 NOTE — Telephone Encounter (Signed)
Spoken and notified patient of Kate's comments. Patient verbalized understanding. 

## 2016-06-11 ENCOUNTER — Encounter: Payer: Self-pay | Admitting: Primary Care

## 2016-06-15 ENCOUNTER — Ambulatory Visit (INDEPENDENT_AMBULATORY_CARE_PROVIDER_SITE_OTHER): Payer: 59 | Admitting: Primary Care

## 2016-06-15 ENCOUNTER — Encounter: Payer: Self-pay | Admitting: Primary Care

## 2016-06-15 DIAGNOSIS — F411 Generalized anxiety disorder: Secondary | ICD-10-CM | POA: Diagnosis not present

## 2016-06-15 DIAGNOSIS — I10 Essential (primary) hypertension: Secondary | ICD-10-CM

## 2016-06-15 DIAGNOSIS — E118 Type 2 diabetes mellitus with unspecified complications: Secondary | ICD-10-CM

## 2016-06-15 MED ORDER — ESCITALOPRAM OXALATE 10 MG PO TABS: 10.0000 mg | ORAL_TABLET | Freq: Every day | ORAL | 1 refills | Status: DC

## 2016-06-15 NOTE — Assessment & Plan Note (Signed)
Stable in office today, weight loss of 10 pounds through healthy diet. Commended her on this success.

## 2016-06-15 NOTE — Progress Notes (Signed)
Pre visit review using our clinic review tool, if applicable. No additional management support is needed unless otherwise documented below in the visit note. 

## 2016-06-15 NOTE — Progress Notes (Signed)
Subjective:    Patient ID: Michelle Marshall, female    DOB: Apr 22, 1966, 50 y.o.   MRN: [id number]  HPI  Michelle Marshall is a 50 year old female who presents today for follow up.  1) Generalized Anxiety Disorder: Presented in late October 2017 with complaints of increased anxiety/irritability. History of GAD in the past and was managed on Alprazolam solely.GAD 7 score of 17 last visit so she was initiated on Lexapro 10 mg.  Since her last visit she's noticed quite a bit of improvement. She's less anxious, less irritable, and is able to "let things go" much easier than before. She denies nausea, headaches, GI upset, SI/HI.  2) Essential Hypertension: Currently managed on Amlodipine 10 mg, carvedilol 12.5 mg BID, and HCTZ 25 mg. Her BP in the office today is 124/84. She has lost 10 pounds since her last visit through healthier diet. She's watching carbs and sweets. She is motivated to continue to lose weight given her A1C several months ago.  Wt Readings from Last 3 Encounters:  06/15/16 222 lb 12.8 oz (101.1 kg)  05/04/16 232 lb (105.2 kg)  01/25/16 229 lb 1.6 oz (103.9 kg)     Review of Systems  Eyes: Negative for visual disturbance.  Respiratory: Negative for shortness of breath.   Cardiovascular: Negative for chest pain.  Neurological: Negative for dizziness.  Psychiatric/Behavioral: Negative for sleep disturbance and suicidal ideas. The patient is not nervous/anxious.        Past Medical History:  Diagnosis Date  . Anemia   . Complication of anesthesia    DURING SCAR TISSUE EXCISION FROM FALLOPIAN TUBES(2002), PT WAS GIVEN GENERAL ANESTHESIA AND PT BEGAN HAVING A REACTION TO THE ANESTHESIA AND LIPS AND TONGUE STARTED SWELLING AND THE SURGERY HAD TO BE STOPPED DUE TO AIRWAY CLOSING UP-PT HAD GA PRIOR TO THIS FOR HER GALLBLADDER WITH NO PROBLEM PREVIOUSLY  . Diabetes mellitus without complication ([place])   . Dysrhythmia    H/O TACHYCARDIA  . Family history of adverse reaction  to anesthesia    [place] UP  . Hypertension   . Irregular periods   . Vitamin D deficiency      Social History   Social History  . Marital status: Married    Spouse name: N/A  . Number of children: N/A  . Years of education: N/A   Occupational History  . Not on file.   Social History Main Topics  . Smoking status: Former Smoker    Years: 20.00    Types: Cigarettes    Quit date: 09/05/2015  . Smokeless tobacco: Never Used     Comment: 2-3 CIGARETES,DAILY. trying to quit....was using chantrix  . Alcohol use No  . Drug use: No  . Sexual activity: Yes    Birth control/ protection: None   Other Topics Concern  . Not on file   Social History Narrative   Married.   1 child.   She works as a Designer, industrial/product   Enjoys shopping, traveling.     Past Surgical History:  Procedure Laterality Date  . CHOLECYSTECTOMY    . [place] N/A 09/19/2015   Procedure: DILATATION & CURETTAGE/HYSTEROSCOPY WITH NOVASURE ABLATION;  Surgeon: Brayton Mars, MD;  Location: [facility] ORS;  Service: Gynecology;  Laterality: N/A;  . JOINT REPLACEMENT Left    30 years ago  . KNEE ARTHROSCOPY Left     Family History  Problem Relation Age of Onset  . Cancer Father  throat  . Hypertension Mother   . Diabetes Paternal Grandmother     Allergies  Allergen Reactions  . Lisinopril Swelling  . Losartan Other (See Comments)    Mouth swollen, rash and itching  . Wellbutrin [Bupropion] Hives and Swelling    Current Outpatient Prescriptions on File Prior to Visit  Medication Sig Dispense Refill  . amLODipine (NORVASC) 10 MG tablet TAKE 1 TABLET(10 MG) BY MOUTH DAILY 90 tablet 2  . canagliflozin (INVOKANA) 100 MG TABS tablet Take 1 tablet (100 mg total) by mouth daily before breakfast. 90 tablet 1  . carvedilol (COREG) 12.5 MG tablet Take 1 tablet (12.5 mg total) by mouth 2 (two) times daily with a meal. 180 tablet 2  .  EPINEPHrine (EPIPEN IJ) Inject as directed as needed.    Marland Kitchen glipiZIDE (GLUCOTROL XL) 10 MG 24 hr tablet TAKE 1 TABLET(10 MG) BY MOUTH DAILY WITH BREAKFAST 90 tablet 1  . hydrochlorothiazide (HYDRODIURIL) 25 MG tablet TAKE 1 TABLET BY MOUTH DAILY 30 tablet 2  . ibuprofen (ADVIL,MOTRIN) 800 MG tablet TAKE 1 TABLET(800 MG) BY MOUTH EVERY 8 HOURS AS NEEDED 30 tablet 0  . Linagliptin-Metformin HCl (JENTADUETO) 2.11-998 MG TABS Take 1 tablet by mouth 2 (two) times daily. 180 tablet 2  . Multiple Vitamin (MULTIVITAMIN) capsule Take 1 capsule by mouth daily.    . traZODone (DESYREL) 100 MG tablet TAKE 1 TABLET BY MOUTH AT BEDTIME AS NEEDED FOR SLEEP 90 tablet 1   No current facility-administered medications on file prior to visit.     BP 124/84   Pulse 69   Temp 97.8 F (36.6 C) (Oral)   Ht 5\' 9"  (1.753 m)   Wt 222 lb 12.8 oz (101.1 kg)   SpO2 96%   BMI 32.90 kg/m    Objective:   Physical Exam  Constitutional: She appears well-nourished.  Neck: Neck supple.  Cardiovascular: Normal rate and regular rhythm.   Pulmonary/Chest: Effort normal and breath sounds normal.  Skin: Skin is warm and dry.  Psychiatric: She has a normal mood and affect.          Assessment & Plan:

## 2016-06-15 NOTE — Patient Instructions (Addendum)
Continue Lexapro 10 mg tablets.  Continue to work on weight loss through healthy diet and exercise. Congratulations on your weight loss!  Schedule a lab only appointment after January 27th for repeat A1C.  It was a pleasure to see you today!

## 2016-06-15 NOTE — Assessment & Plan Note (Signed)
Moderate improvement since last visit. Continue Lexapro 10 mg. Denies SI/HI.

## 2016-06-15 NOTE — Assessment & Plan Note (Signed)
A1C due in late January 2018. Jentadueto too expensive, will likely discontinue and start Metformin based off of A1C in January.

## 2016-07-17 ENCOUNTER — Other Ambulatory Visit: Payer: Self-pay | Admitting: Primary Care

## 2016-07-17 DIAGNOSIS — F411 Generalized anxiety disorder: Secondary | ICD-10-CM

## 2016-07-30 ENCOUNTER — Other Ambulatory Visit: Payer: Self-pay | Admitting: Primary Care

## 2016-07-30 DIAGNOSIS — I10 Essential (primary) hypertension: Secondary | ICD-10-CM

## 2016-07-30 NOTE — Telephone Encounter (Signed)
Ok to refill? Electronically refill request for   carvedilol (COREG) 12.5 MG tablet  Last prescribed on 11/03/2015. Last seen on 06/15/2016.

## 2016-07-31 ENCOUNTER — Other Ambulatory Visit: Payer: Self-pay | Admitting: Primary Care

## 2016-07-31 DIAGNOSIS — E119 Type 2 diabetes mellitus without complications: Secondary | ICD-10-CM

## 2016-08-08 ENCOUNTER — Other Ambulatory Visit (INDEPENDENT_AMBULATORY_CARE_PROVIDER_SITE_OTHER): Payer: 59

## 2016-08-08 DIAGNOSIS — E119 Type 2 diabetes mellitus without complications: Secondary | ICD-10-CM | POA: Diagnosis not present

## 2016-08-08 LAB — HEMOGLOBIN A1C: Hgb A1c MFr Bld: 7.5 % — ABNORMAL HIGH (ref 4.6–6.5)

## 2016-08-14 ENCOUNTER — Other Ambulatory Visit: Payer: Self-pay | Admitting: Primary Care

## 2016-08-14 DIAGNOSIS — I10 Essential (primary) hypertension: Secondary | ICD-10-CM

## 2016-09-13 ENCOUNTER — Other Ambulatory Visit: Payer: Self-pay | Admitting: Primary Care

## 2016-09-13 DIAGNOSIS — I10 Essential (primary) hypertension: Secondary | ICD-10-CM

## 2016-09-13 NOTE — Telephone Encounter (Signed)
Ok to refill? Electronically refill request for hydrochlorothiazide (HYDRODIURIL) 25 MG tablet. Last prescribed on

## 2016-09-20 ENCOUNTER — Ambulatory Visit (INDEPENDENT_AMBULATORY_CARE_PROVIDER_SITE_OTHER): Payer: 59 | Admitting: Primary Care

## 2016-09-20 ENCOUNTER — Other Ambulatory Visit: Payer: Self-pay | Admitting: Primary Care

## 2016-09-20 ENCOUNTER — Encounter: Payer: Self-pay | Admitting: Primary Care

## 2016-09-20 VITALS — BP 116/76 | HR 74 | Temp 98.3°F | Ht 69.0 in | Wt 217.1 lb

## 2016-09-20 DIAGNOSIS — E876 Hypokalemia: Secondary | ICD-10-CM

## 2016-09-20 DIAGNOSIS — R1030 Lower abdominal pain, unspecified: Secondary | ICD-10-CM | POA: Diagnosis not present

## 2016-09-20 LAB — CBC WITH DIFFERENTIAL/PLATELET
Basophils Absolute: 0.1 10*3/uL (ref 0.0–0.1)
Basophils Relative: 0.9 % (ref 0.0–3.0)
EOS ABS: 0.2 10*3/uL (ref 0.0–0.7)
EOS PCT: 2 % (ref 0.0–5.0)
HEMATOCRIT: 41.5 % (ref 36.0–46.0)
HEMOGLOBIN: 14.2 g/dL (ref 12.0–15.0)
LYMPHS PCT: 30.8 % (ref 12.0–46.0)
Lymphs Abs: 3.2 10*3/uL (ref 0.7–4.0)
MCHC: 34.1 g/dL (ref 30.0–36.0)
MCV: 90.6 fl (ref 78.0–100.0)
Monocytes Absolute: 0.8 10*3/uL (ref 0.1–1.0)
Monocytes Relative: 7.7 % (ref 3.0–12.0)
Neutro Abs: 6.1 10*3/uL (ref 1.4–7.7)
Neutrophils Relative %: 58.6 % (ref 43.0–77.0)
Platelets: 380 10*3/uL (ref 150.0–400.0)
RBC: 4.59 Mil/uL (ref 3.87–5.11)
RDW: 13.7 % (ref 11.5–15.5)
WBC: 10.5 10*3/uL (ref 4.0–10.5)

## 2016-09-20 LAB — COMPREHENSIVE METABOLIC PANEL
ALBUMIN: 4.3 g/dL (ref 3.5–5.2)
ALK PHOS: 37 U/L — AB (ref 39–117)
ALT: 24 U/L (ref 0–35)
AST: 18 U/L (ref 0–37)
BILIRUBIN TOTAL: 0.3 mg/dL (ref 0.2–1.2)
BUN: 16 mg/dL (ref 6–23)
CALCIUM: 10 mg/dL (ref 8.4–10.5)
CO2: 28 mEq/L (ref 19–32)
CREATININE: 0.93 mg/dL (ref 0.40–1.20)
Chloride: 99 mEq/L (ref 96–112)
GFR: 82.22 mL/min (ref >60.00)
Glucose, Bld: 228 mg/dL — ABNORMAL HIGH (ref 70–99)
Potassium: 3.1 mEq/L — ABNORMAL LOW (ref 3.5–5.1)
SODIUM: 138 meq/L (ref 135–145)
TOTAL PROTEIN: 7.1 g/dL (ref 6.0–8.3)

## 2016-09-20 MED ORDER — POTASSIUM CHLORIDE ER 20 MEQ PO TBCR: 20.0000 meq | EXTENDED_RELEASE_TABLET | Freq: Two times a day (BID) | ORAL | 0 refills | Status: DC

## 2016-09-20 NOTE — Patient Instructions (Signed)
Your symptoms could be related to constipation secondary to Invokana. Hold the Invokana for two weeks. Continue your other medications.  You may try Miralax mixture or docusate sodium (stool softener) capsules for constipation.  Ensure you are eating enough fiber and drinking enough water. Take a look at the information below.  Complete lab work prior to leaving today. I will notify you of your results once received.   It was a pleasure to see you today!   High-Fiber Diet Fiber, also called dietary fiber, is a type of carbohydrate found in fruits, vegetables, whole grains, and beans. A high-fiber diet can have many health benefits. Your health care provider may recommend a high-fiber diet to help:  Prevent constipation. Fiber can make your bowel movements more regular.  Lower your cholesterol.  Relieve hemorrhoids, uncomplicated diverticulosis, or irritable bowel syndrome.  Prevent overeating as part of a weight-loss plan.  Prevent heart disease, type 2 diabetes, and certain cancers. What is my plan? The recommended daily intake of fiber includes:  38 grams for men under age 58.  68 grams for men over age 38.  51 grams for women under age 80.  21 grams for women over age 38. You can get the recommended daily intake of dietary fiber by eating a variety of fruits, vegetables, grains, and beans. Your health care provider may also recommend a fiber supplement if it is not possible to get enough fiber through your diet. What do I need to know about a high-fiber diet?  Fiber supplements have not been widely studied for their effectiveness, so it is better to get fiber through food sources.  Always check the fiber content on thenutrition facts label of any prepackaged food. Look for foods that contain at least 5 grams of fiber per serving.  Ask your dietitian if you have questions about specific foods that are related to your condition, especially if those foods are not listed in  the following section.  Increase your daily fiber consumption gradually. Increasing your intake of dietary fiber too quickly may cause bloating, cramping, or gas.  Drink plenty of water. Water helps you to digest fiber. What foods can I eat? Grains  Whole-grain breads. Multigrain cereal. Oats and oatmeal. Brown rice. Barley. Bulgur wheat. [place]. Bran muffins. Popcorn. Rye wafer crackers. Vegetables  Sweet potatoes. Spinach. Kale. Artichokes. Cabbage. Broccoli. Green peas. Carrots. Squash. Fruits  Berries. Pears. Apples. Oranges. Avocados. Prunes and raisins. Dried figs. Meats and Other Protein Sources  [place], kidney, pinto, and soy beans. Split peas. Lentils. Nuts and seeds. Dairy  Fiber-fortified yogurt. Beverages  Fiber-fortified soy milk. Fiber-fortified orange juice. Other  Fiber bars. The items listed above may not be a complete list of recommended foods or beverages. Contact your dietitian for more options.  What foods are not recommended? Grains  White bread. Pasta made with refined flour. White rice. Vegetables  Fried potatoes. Canned vegetables. Well-cooked vegetables. Fruits  Fruit juice. Cooked, strained fruit. Meats and Other Protein Sources  Fatty cuts of meat. Fried [employer] executive or fried fish. Dairy  Milk. Yogurt. Cream cheese. Sour cream. Beverages  Soft drinks. Other  Cakes and pastries. Butter and oils. The items listed above may not be a complete list of foods and beverages to avoid. Contact your dietitian for more information.  What are some tips for including high-fiber foods in my diet?  Eat a wide variety of high-fiber foods.  Make sure that half of all grains consumed each day are whole grains.  Replace breads and  cereals made from refined flour or white flour with whole-grain breads and cereals.  Replace white rice with brown rice, bulgur wheat, or millet.  Start the day with a breakfast that is high in fiber, such as a cereal that contains at least 5  grams of fiber per serving.  Use beans in place of meat in soups, salads, or pasta.  Eat high-fiber snacks, such as berries, raw vegetables, nuts, or popcorn. This information is not intended to replace advice given to you by your health care provider. Make sure you discuss any questions you have with your health care provider. Document Released: 06/25/2005 Document Revised: 12/01/2015 Document Reviewed: 12/08/2013 Elsevier Interactive Patient Education  2017 [employer].

## 2016-09-20 NOTE — Progress Notes (Signed)
Subjective:    Patient ID: Michelle Marshall, female    DOB: Sep 10, 1965, 51 y.o.   MRN: [id number]  HPI  Ms. Barrientez is a 51 year old female with a history of type 2 diabetes and hypertension who presents today with a chief complaint of abdominal pain. Her pain is located to the right groin. Her pain has been present for the past 4 weeks, occurs intermittently, mostly once weekly, then daily for the past two weeks. She describes her pain as sharp/stabbing that will last until she takes ibuprofen. She's thought about going to the emergency department several times.   She's also noticed constipation with bowel movements 2-3 times weekly which is a change for her. She does strain with each movement.  She denies nausea, vomiting, rectal bleeding, changes in appetite, fevers, weakness. She thinks her constipation began when we initiated Invokana in November 2017. She's been taking 800 mg of ibuprofen up to every 4 hours with improvement.    Review of Systems  Constitutional: Negative for chills and fever.  Respiratory: Negative for shortness of breath.   Cardiovascular: Negative for chest pain.  Gastrointestinal: Positive for abdominal pain and constipation. Negative for blood in stool, diarrhea, nausea and vomiting.  Neurological: Negative for weakness.       Past Medical History:  Diagnosis Date  . Anemia   . Complication of anesthesia    DURING SCAR TISSUE EXCISION FROM FALLOPIAN TUBES(2002), PT WAS GIVEN GENERAL ANESTHESIA AND PT BEGAN HAVING A REACTION TO THE ANESTHESIA AND LIPS AND TONGUE STARTED SWELLING AND THE SURGERY HAD TO BE STOPPED DUE TO AIRWAY CLOSING UP-PT HAD GA PRIOR TO THIS FOR HER GALLBLADDER WITH NO PROBLEM PREVIOUSLY  . Diabetes mellitus without complication ([place])   . Dysrhythmia    H/O TACHYCARDIA  . Family history of adverse reaction to anesthesia    [place] UP  . Hypertension   . Irregular periods   . Vitamin D deficiency      Social  History   Social History  . Marital status: Married    Spouse name: N/A  . Number of children: N/A  . Years of education: N/A   Occupational History  . Not on file.   Social History Main Topics  . Smoking status: Former Smoker    Years: 20.00    Types: Cigarettes    Quit date: 09/05/2015  . Smokeless tobacco: Never Used     Comment: 2-3 CIGARETES,DAILY. trying to quit....was using chantrix  . Alcohol use No  . Drug use: No  . Sexual activity: Yes    Birth control/ protection: None   Other Topics Concern  . Not on file   Social History Narrative   Married.   1 child.   She works as a Designer, industrial/product   Enjoys shopping, traveling.     Past Surgical History:  Procedure Laterality Date  . CHOLECYSTECTOMY    . [place] N/A 09/19/2015   Procedure: DILATATION & CURETTAGE/HYSTEROSCOPY WITH NOVASURE ABLATION;  Surgeon: Brayton Mars, MD;  Location: [facility] ORS;  Service: Gynecology;  Laterality: N/A;  . JOINT REPLACEMENT Left    30 years ago  . KNEE ARTHROSCOPY Left     Family History  Problem Relation Age of Onset  . Cancer Father     throat  . Hypertension Mother   . Diabetes Paternal Grandmother     Allergies  Allergen Reactions  . Lisinopril Swelling  . Losartan Other (See Comments)  Mouth swollen, rash and itching  . Wellbutrin [Bupropion] Hives and Swelling    Current Outpatient Prescriptions on File Prior to Visit  Medication Sig Dispense Refill  . amLODipine (NORVASC) 10 MG tablet TAKE 1 TABLET(10 MG) BY MOUTH DAILY 90 tablet 2  . canagliflozin (INVOKANA) 100 MG TABS tablet Take 1 tablet (100 mg total) by mouth daily before breakfast. 90 tablet 1  . carvedilol (COREG) 12.5 MG tablet TAKE 1 TABLET(12.5 MG) BY MOUTH TWICE DAILY WITH A MEAL 180 tablet 2  . EPINEPHrine (EPIPEN IJ) Inject as directed as needed.    Marland Kitchen escitalopram (LEXAPRO) 10 MG tablet Take 1 tablet (10 mg total) by mouth daily. 90 tablet 1    . glipiZIDE (GLUCOTROL XL) 10 MG 24 hr tablet TAKE 1 TABLET(10 MG) BY MOUTH DAILY WITH BREAKFAST 90 tablet 1  . hydrochlorothiazide (HYDRODIURIL) 25 MG tablet TAKE 1 TABLET BY MOUTH DAILY 90 tablet 1  . ibuprofen (ADVIL,MOTRIN) 800 MG tablet TAKE 1 TABLET(800 MG) BY MOUTH EVERY 8 HOURS AS NEEDED 30 tablet 0  . Linagliptin-Metformin HCl (JENTADUETO) 2.11-998 MG TABS Take 1 tablet by mouth 2 (two) times daily. 180 tablet 2  . Multiple Vitamin (MULTIVITAMIN) capsule Take 1 capsule by mouth daily.    . traZODone (DESYREL) 100 MG tablet TAKE 1 TABLET BY MOUTH AT BEDTIME AS NEEDED FOR SLEEP 90 tablet 1   No current facility-administered medications on file prior to visit.     BP 116/76   Pulse 74   Temp 98.3 F (36.8 C) (Oral)   Ht 5\' 9"  (1.753 m)   Wt 217 lb 1.9 oz (98.5 kg)   SpO2 96%   BMI 32.06 kg/m    Objective:   Physical Exam  Constitutional: She appears well-nourished. She does not appear ill.  Neck: Neck supple.  Cardiovascular: Normal rate and regular rhythm.   Pulmonary/Chest: Effort normal and breath sounds normal.  Abdominal: Soft. Bowel sounds are normal. There is no tenderness.  Skin: Skin is warm and dry.          Assessment & Plan:  Abdominal Pain:  Located to right groin x 4 weeks. Also with constipation since starting Invokana. Suspect abdominal pain secondary to constipation. Constipation could be secondary to Invokana. No other symptoms to suggest bacterial/viral involvement. Do not suspect acute appendicitis. Consider ovarian cysts. Stable exam, no discomfort or distress. Hold invokana for 2 weeks. Will have her trial Miralax or Colace for constipation in the interim.  CBC and CMP pending. Will contact in two weeks regarding symptoms. She will contact sooner if no improvement.  Sheral Flow, NP

## 2016-09-20 NOTE — Progress Notes (Signed)
Pre visit review using our clinic review tool, if applicable. No additional management support is needed unless otherwise documented below in the visit note. 

## 2016-09-21 ENCOUNTER — Other Ambulatory Visit: Payer: Self-pay | Admitting: Primary Care

## 2016-09-21 DIAGNOSIS — E876 Hypokalemia: Secondary | ICD-10-CM

## 2016-10-04 ENCOUNTER — Telehealth: Payer: Self-pay | Admitting: Primary Care

## 2016-10-04 NOTE — Telephone Encounter (Signed)
Message left for patient to return my call.  

## 2016-10-04 NOTE — Telephone Encounter (Signed)
-----   Message from Pleas Koch, NP sent at 09/20/2016 10:30 AM EDT ----- Regarding: Abdominal pain/Constipation Please check on patient's abdominal pain and constipation since we removed Invokana.

## 2016-10-11 NOTE — Telephone Encounter (Signed)
She had a good drop in A1C between October 2017 and January 2018. At this point I recommend she continue to work on healthy diet and exercise and continue current meds. Let's move her appointment to June, have her come in 2 days before her appointment for A1C and then discuss. I think she may be able to do this without additional medication at this point.  In order to add victoza we'd have to adjust her other medications and since she's done so well I'd like to see how she does on the next A1C check.

## 2016-10-11 NOTE — Telephone Encounter (Signed)
Was able to get a hold of patient. Patient stated that her abdoimial pain is better. She is taking OTC for the constiption which is okay for now.  Also patient stated that she would like to start Victoza as discussed before.

## 2016-10-12 NOTE — Telephone Encounter (Signed)
Spoken and notified patient of Kate's comments. Patient verbalized understanding.  Follow up on 12/20/2016.  Lab appt 12/17/2016

## 2016-11-15 ENCOUNTER — Other Ambulatory Visit: Payer: Self-pay | Admitting: Primary Care

## 2016-11-15 DIAGNOSIS — E119 Type 2 diabetes mellitus without complications: Secondary | ICD-10-CM

## 2016-12-13 ENCOUNTER — Other Ambulatory Visit: Payer: Self-pay | Admitting: Primary Care

## 2016-12-13 DIAGNOSIS — E118 Type 2 diabetes mellitus with unspecified complications: Secondary | ICD-10-CM

## 2016-12-17 ENCOUNTER — Other Ambulatory Visit (INDEPENDENT_AMBULATORY_CARE_PROVIDER_SITE_OTHER): Payer: 59

## 2016-12-17 ENCOUNTER — Other Ambulatory Visit: Payer: Self-pay

## 2016-12-17 DIAGNOSIS — F411 Generalized anxiety disorder: Secondary | ICD-10-CM

## 2016-12-17 DIAGNOSIS — E118 Type 2 diabetes mellitus with unspecified complications: Secondary | ICD-10-CM | POA: Diagnosis not present

## 2016-12-17 LAB — HEMOGLOBIN A1C: HEMOGLOBIN A1C: 8.8 % — AB (ref 4.6–6.5)

## 2016-12-17 MED ORDER — ESCITALOPRAM OXALATE 10 MG PO TABS: 10.0000 mg | ORAL_TABLET | Freq: Every day | ORAL | 1 refills | Status: DC

## 2016-12-17 NOTE — Telephone Encounter (Signed)
Pt left note requesting refill Jentadueto (pt will ck with pharmacy should have available refills) and escitalopram; refilled per protocol and pt voiced understanding; [facility] [address]

## 2016-12-18 ENCOUNTER — Other Ambulatory Visit: Payer: Self-pay | Admitting: Primary Care

## 2016-12-18 DIAGNOSIS — G4700 Insomnia, unspecified: Secondary | ICD-10-CM

## 2016-12-18 NOTE — Telephone Encounter (Signed)
Ok to refill? Electronically refill request for traZODone (DESYREL) 100 MG tablet. Last prescribed on 05/26/2016. Last seen on 09/20/2016

## 2016-12-20 ENCOUNTER — Ambulatory Visit (INDEPENDENT_AMBULATORY_CARE_PROVIDER_SITE_OTHER): Payer: 59 | Admitting: Primary Care

## 2016-12-20 ENCOUNTER — Encounter: Payer: Self-pay | Admitting: Primary Care

## 2016-12-20 ENCOUNTER — Other Ambulatory Visit: Payer: Self-pay | Admitting: Primary Care

## 2016-12-20 VITALS — BP 116/74 | HR 81 | Temp 98.0°F | Ht 69.0 in | Wt 221.2 lb

## 2016-12-20 DIAGNOSIS — F411 Generalized anxiety disorder: Secondary | ICD-10-CM

## 2016-12-20 DIAGNOSIS — Z23 Encounter for immunization: Secondary | ICD-10-CM | POA: Diagnosis not present

## 2016-12-20 DIAGNOSIS — I10 Essential (primary) hypertension: Secondary | ICD-10-CM | POA: Diagnosis not present

## 2016-12-20 DIAGNOSIS — E119 Type 2 diabetes mellitus without complications: Secondary | ICD-10-CM

## 2016-12-20 MED ORDER — FLUOXETINE HCL 20 MG PO TABS: 20.0000 mg | ORAL_TABLET | Freq: Every day | ORAL | 0 refills | Status: DC

## 2016-12-20 MED ORDER — CANAGLIFLOZIN 300 MG PO TABS: 300.0000 mg | ORAL_TABLET | Freq: Every day | ORAL | 1 refills | Status: DC

## 2016-12-20 MED ORDER — CARVEDILOL 12.5 MG PO TABS: ORAL_TABLET | ORAL | 2 refills | Status: DC

## 2016-12-20 NOTE — Assessment & Plan Note (Signed)
Discussed the importance of a healthy diet and regular exercise in order for weight loss, and to reduce the risk of other medical problems.  

## 2016-12-20 NOTE — Assessment & Plan Note (Signed)
Increase in A1C to 8.8. Will add in Invokana again, increase does to 300 mg. Will watch potassium. Continue other meds. Long discussion today regarding importance of improving diet and to start exercising. Foot exam unremarkable today. Pneumovax 23 provided. Repeat A1C in 3 months.

## 2016-12-20 NOTE — Addendum Note (Signed)
Addended by: Jacqualin Combes on: 12/20/2016 08:46 AM   Modules accepted: Orders

## 2016-12-20 NOTE — Patient Instructions (Addendum)
We've increased your Invokana to 300 mg. Take 1 tablet by mouth once daily.   Continue Jentadueto 2.11/998 mg and Glipizide.  Work on improvements in [employer] as discussed.  Start exercising. You should be getting 150 minutes of moderate intensity exercise weekly.  You were provided with a pneumonia vaccination today.  Start fluoxetine 20 mg tablets. Start by taking 1/2 tablet daily for 8 days, then advance to 1 full tablet thereafter.  Schedule a lab only appointment in 3 weeks to recheck your potassium.  Schedule a lab only appointment in 3 months for recheck of your A1C.  Schedule a follow up visit in 6 months.   It was a pleasure to see you today!  Diabetes Mellitus and Food It is important for you to manage your blood sugar (glucose) level. Your blood glucose level can be greatly affected by what you eat. Eating healthier foods in the appropriate amounts throughout the day at about the same time each day will help you control your blood glucose level. It can also help slow or prevent worsening of your diabetes mellitus. Healthy eating may even help you improve the level of your blood pressure and reach or maintain a healthy weight. General recommendations for healthful eating and cooking habits include:  Eating meals and snacks regularly. Avoid going long periods of time without eating to lose weight.  Eating a diet that consists mainly of plant-based foods, such as fruits, vegetables, nuts, legumes, and whole grains.  Using low-heat cooking methods, such as baking, instead of high-heat cooking methods, such as deep frying.  Work with your dietitian to make sure you understand how to use the Nutrition Facts information on food labels. How can food affect me? Carbohydrates Carbohydrates affect your blood glucose level more than any other type of food. Your dietitian will help you determine how many carbohydrates to eat at each meal and teach you how to count carbohydrates.  Counting carbohydrates is important to keep your blood glucose at a healthy level, especially if you are using insulin or taking certain medicines for diabetes mellitus. Alcohol Alcohol can cause sudden decreases in blood glucose (hypoglycemia), especially if you use insulin or take certain medicines for diabetes mellitus. Hypoglycemia can be a life-threatening condition. Symptoms of hypoglycemia (sleepiness, dizziness, and disorientation) are similar to symptoms of having too much alcohol. If your health care provider has given you approval to drink alcohol, do so in moderation and use the following guidelines:  Women should not have more than one drink per day, and men should not have more than two drinks per day. One drink is equal to: ? 12 oz of beer. ? 5 oz of wine. ? 1 oz of hard liquor.  Do not drink on an empty stomach.  Keep yourself hydrated. Have water, diet soda, or unsweetened iced tea.  Regular soda, juice, and other mixers might contain a lot of carbohydrates and should be counted.  What foods are not recommended? As you make food choices, it is important to remember that all foods are not the same. Some foods have fewer nutrients per serving than other foods, even though they might have the same number of calories or carbohydrates. It is difficult to get your body what it needs when you eat foods with fewer nutrients. Examples of foods that you should avoid that are high in calories and carbohydrates but low in nutrients include:  Trans fats (most processed foods list trans fats on the Nutrition Facts label).  Regular soda.  Juice.  Candy.  Sweets, such as cake, pie, doughnuts, and cookies.  Fried foods.  What foods can I eat? Eat nutrient-rich foods, which will nourish your body and keep you healthy. The food you should eat also will depend on several factors, including:  The calories you need.  The medicines you take.  Your weight.  Your blood glucose  level.  Your blood pressure level.  Your cholesterol level.  You should eat a variety of foods, including:  Protein. ? Lean cuts of meat. ? Proteins low in saturated fats, such as fish, egg whites, and beans. Avoid processed meats.  Fruits and vegetables. ? Fruits and vegetables that may help control blood glucose levels, such as apples, mangoes, and yams.  Dairy products. ? Choose fat-free or low-fat dairy products, such as milk, yogurt, and cheese.  Grains, bread, pasta, and rice. ? Choose whole grain products, such as multigrain bread, whole oats, and brown rice. These foods may help control blood pressure.  Fats. ? Foods containing healthful fats, such as nuts, avocado, olive oil, canola oil, and fish.  Does everyone with diabetes mellitus have the same meal plan? Because every person with diabetes mellitus is different, there is not one meal plan that works for everyone. It is very important that you meet with a dietitian who will help you create a meal plan that is just right for you. This information is not intended to replace advice given to you by your health care provider. Make sure you discuss any questions you have with your health care provider. Document Released: 03/22/2005 Document Revised: 12/01/2015 Document Reviewed: 05/22/2013 Elsevier Interactive Patient Education  2017 [employer].

## 2016-12-20 NOTE — Assessment & Plan Note (Signed)
No improvement with lexapro. Will trial low dose Prozac 20 mg. Instructions provided for use. Will check on her in 3-4 weeks. Denies Si/HI.

## 2016-12-20 NOTE — Progress Notes (Signed)
Subjective:    Patient ID: Michelle Marshall, female    DOB: 1965/09/07, 51 y.o.   MRN: [id number]  HPI  Michelle Marshall is a 51 year old female who presents today for follow up.  1) Type 2 Diabetes: Currently managed on Invokana 100 mg, Glipizide XL 10 mg, litagliptin-metformin 2.11/998 mg BID. Her recent A1C was 8.8 which was an increase from 7.5 four months prior. She stopped taking the Invokana due to cost and a miscommunication.   Diet currently consists of:  Breakfast: Starbucks coffee, oatmeal with fruit Lunch: Pizza, fast food, restaurants, potatoes  Dinner: Pasta, salad, vegetables, calzones Snacks: None Desserts: None Beverages: Coffee, water, un-sweet tea  Exercise: She is not currently exercising  2) GAD: Previously managed on Lexapro 10 mg due to excessive anxiety. She's not had her Lexapro in several months as she stopped taking it as she thought it wasn't working. She really didn't notice a difference when she started and stopped the Lexapro. GAD 7 score of 13 today. She is interested in starting something else.   Review of Systems  Eyes: Negative for visual disturbance.  Respiratory: Negative for shortness of breath.   Cardiovascular: Negative for chest pain.  Neurological: Negative for numbness and headaches.  Psychiatric/Behavioral: Negative for sleep disturbance and suicidal ideas. The patient is nervous/anxious.        Past Medical History:  Diagnosis Date  . Anemia   . Complication of anesthesia    DURING SCAR TISSUE EXCISION FROM FALLOPIAN TUBES(2002), PT WAS GIVEN GENERAL ANESTHESIA AND PT BEGAN HAVING A REACTION TO THE ANESTHESIA AND LIPS AND TONGUE STARTED SWELLING AND THE SURGERY HAD TO BE STOPPED DUE TO AIRWAY CLOSING UP-PT HAD GA PRIOR TO THIS FOR HER GALLBLADDER WITH NO PROBLEM PREVIOUSLY  . Diabetes mellitus without complication ([place])   . Dysrhythmia    H/O TACHYCARDIA  . Family history of adverse reaction to anesthesia    [place] UP  . Hypertension   . Irregular periods   . Vitamin D deficiency      Social History   Social History  . Marital status: Married    Spouse name: N/A  . Number of children: N/A  . Years of education: N/A   Occupational History  . Not on file.   Social History Main Topics  . Smoking status: Former Smoker    Years: 20.00    Types: Cigarettes    Quit date: 09/05/2015  . Smokeless tobacco: Never Used     Comment: 2-3 CIGARETES,DAILY. trying to quit....was using chantrix  . Alcohol use No  . Drug use: No  . Sexual activity: Yes    Birth control/ protection: None   Other Topics Concern  . Not on file   Social History Narrative   Married.   1 child.   She works as a Designer, industrial/product   Enjoys shopping, traveling.     Past Surgical History:  Procedure Laterality Date  . CHOLECYSTECTOMY    . [place] N/A 09/19/2015   Procedure: DILATATION & CURETTAGE/HYSTEROSCOPY WITH NOVASURE ABLATION;  Surgeon: Brayton Mars, MD;  Location: [facility] ORS;  Service: Gynecology;  Laterality: N/A;  . JOINT REPLACEMENT Left    30 years ago  . KNEE ARTHROSCOPY Left     Family History  Problem Relation Age of Onset  . Cancer Father        throat  . Hypertension Mother   . Diabetes Paternal Grandmother  Allergies  Allergen Reactions  . Lisinopril Swelling  . Losartan Other (See Comments)    Mouth swollen, rash and itching  . Wellbutrin [Bupropion] Hives and Swelling    Current Outpatient Prescriptions on File Prior to Visit  Medication Sig Dispense Refill  . amLODipine (NORVASC) 10 MG tablet TAKE 1 TABLET(10 MG) BY MOUTH DAILY 90 tablet 2  . EPINEPHrine (EPIPEN IJ) Inject as directed as needed.    Marland Kitchen glipiZIDE (GLUCOTROL XL) 10 MG 24 hr tablet TAKE 1 TABLET(10 MG) BY MOUTH DAILY WITH BREAKFAST 90 tablet 1  . hydrochlorothiazide (HYDRODIURIL) 25 MG tablet TAKE 1 TABLET BY MOUTH DAILY 90 tablet 1  . ibuprofen (ADVIL,MOTRIN)  800 MG tablet TAKE 1 TABLET(800 MG) BY MOUTH EVERY 8 HOURS AS NEEDED 30 tablet 0  . JENTADUETO 2.11-998 MG TABS TAKE 1 TABLET BY MOUTH TWICE DAILY 180 tablet 0  . Multiple Vitamin (MULTIVITAMIN) capsule Take 1 capsule by mouth daily.    . traZODone (DESYREL) 100 MG tablet TAKE 1 TABLET BY MOUTH AT BEDTIME AS NEEDED FOR SLEEP 90 tablet 0   No current facility-administered medications on file prior to visit.     BP 116/74   Pulse 81   Temp 98 F (36.7 C) (Oral)   Ht 5\' 9"  (1.753 m)   Wt 221 lb 3.2 oz (100.3 kg)   SpO2 98%   BMI 32.67 kg/m    Objective:   Physical Exam  Constitutional: She appears well-nourished.  Neck: Neck supple.  Cardiovascular: Normal rate and regular rhythm.   Pulmonary/Chest: Effort normal and breath sounds normal.  Skin: Skin is warm and dry.  Psychiatric: She has a normal mood and affect.          Assessment & Plan:

## 2017-01-03 ENCOUNTER — Ambulatory Visit (INDEPENDENT_AMBULATORY_CARE_PROVIDER_SITE_OTHER): Payer: 59 | Admitting: Obstetrics and Gynecology

## 2017-01-03 ENCOUNTER — Encounter: Payer: Self-pay | Admitting: Obstetrics and Gynecology

## 2017-01-03 VITALS — BP 120/70 | HR 84 | Ht 69.0 in | Wt 218.5 lb

## 2017-01-03 DIAGNOSIS — Z9889 Other specified postprocedural states: Secondary | ICD-10-CM | POA: Diagnosis not present

## 2017-01-03 DIAGNOSIS — N939 Abnormal uterine and vaginal bleeding, unspecified: Secondary | ICD-10-CM

## 2017-01-03 NOTE — Progress Notes (Signed)
Chief complaint: 1. Abnormal uterine bleeding 2. History of endometrial ablation  Patient is status post NovaSure endometrial ablation from 09/19/2015. Post procedure the patient experienced 7 weeks of spotting subsequent amenorrhea thereafter. However, 1 month ago the patient began having intermittent spotting, just enough to where she has to wear protection. She is noting minimal cramping with the bleeding. She also is noting some cyclic breast tenderness changes around the times of her spotting.  The patient is not having any new sexual partners; she is monogamous and married. Patient does have history of uterine fibroids  Past medical history, past surgical history, problem list, medications, and allergies are reviewed  OBJECTIVE: BP 120/70   Pulse 84   Ht 5\' 9"  (1.753 m)   Wt 218 lb 8 oz (99.1 kg)   BMI 32.27 kg/m  Pleasant well-appearing female in no acute distress. Alert and oriented. Abdomen: Soft, nontender, without organomegaly Pelvic exam: External genitalia-normal BUS-normal Vagina-normal estrogen effect; no discharge; no lesions Cervix-no discharge; no cervical motion tenderness Uterus-midplane, normal size and shape, mobile, nontender Adnexa-nonpalpable and nontender Rectovaginal-normal external exam  ASSESSMENT:  1. New onset abnormal uterine bleeding, 1 year following NovaSure endometrial ablation 2. History of uterine fibroid  PLAN: 1. Maintain menstrual calendar monitoring of abnormal uterine bleeding 2. Pelvic ultrasound 3. Return in 3 months for follow-up  A total of 15 minutes were spent face-to-face with the patient during this encounter and over half of that time dealt with counseling and coordination of care.  Brayton Mars, MD  Note: This dictation was prepared with Dragon dictation along with smaller phrase technology. Any transcriptional errors that result from this process are unintentional.

## 2017-01-03 NOTE — Patient Instructions (Signed)
1. Pelvic ultrasound is scheduled 2. Maintain menstrual calendar monitoring for assessment of abnormal uterine bleeding 3. Return in 3 months for follow-up 4. Results from ultrasound will be made available

## 2017-01-07 ENCOUNTER — Ambulatory Visit (INDEPENDENT_AMBULATORY_CARE_PROVIDER_SITE_OTHER): Payer: 59

## 2017-01-07 DIAGNOSIS — N939 Abnormal uterine and vaginal bleeding, unspecified: Secondary | ICD-10-CM

## 2017-01-07 DIAGNOSIS — Z9889 Other specified postprocedural states: Secondary | ICD-10-CM

## 2017-01-17 ENCOUNTER — Telehealth: Payer: Self-pay | Admitting: Primary Care

## 2017-01-17 ENCOUNTER — Other Ambulatory Visit: Payer: Self-pay | Admitting: Primary Care

## 2017-01-17 ENCOUNTER — Ambulatory Visit: Payer: 59 | Admitting: Primary Care

## 2017-01-17 DIAGNOSIS — F411 Generalized anxiety disorder: Secondary | ICD-10-CM

## 2017-01-17 NOTE — Telephone Encounter (Signed)
Message left for patient to return my call.  

## 2017-01-17 NOTE — Telephone Encounter (Addendum)
-----   Message from Pleas Koch, NP sent at 12/20/2016  7:53 AM EDT ----- Regarding: GAD Please check on patient since we switched from Lexapro for fluoxetine. How's her anxiety?

## 2017-01-18 NOTE — Telephone Encounter (Signed)
Noted  

## 2017-01-18 NOTE — Telephone Encounter (Signed)
Spoken to patient and she stated that the Prozac makes her feel bad. She stop taking it for days now.

## 2017-01-18 NOTE — Telephone Encounter (Signed)
Spoken and notified patient of Kate's comments. Patient stated that she would like to go back and take the Lexapro 10 mg. She still have some at home and will let pharmacy know when she runs low.

## 2017-01-18 NOTE — Telephone Encounter (Signed)
I'm sorry to hear that the Prozac made her feel bad. We can try a medication called venlafaxine (Effexor) for anxiety. If she's agreeable I will send this to her pharmacy. If she felt better on Lexapro we can resume that. Please notify me of her decision.

## 2017-02-07 ENCOUNTER — Other Ambulatory Visit: Payer: Self-pay | Admitting: Primary Care

## 2017-02-07 DIAGNOSIS — E119 Type 2 diabetes mellitus without complications: Secondary | ICD-10-CM

## 2017-02-12 ENCOUNTER — Other Ambulatory Visit: Payer: Self-pay | Admitting: Primary Care

## 2017-02-12 DIAGNOSIS — I10 Essential (primary) hypertension: Secondary | ICD-10-CM

## 2017-03-12 ENCOUNTER — Other Ambulatory Visit: Payer: Self-pay | Admitting: Primary Care

## 2017-03-12 DIAGNOSIS — E119 Type 2 diabetes mellitus without complications: Secondary | ICD-10-CM

## 2017-03-15 ENCOUNTER — Other Ambulatory Visit: Payer: Self-pay | Admitting: Primary Care

## 2017-03-15 DIAGNOSIS — E119 Type 2 diabetes mellitus without complications: Secondary | ICD-10-CM

## 2017-03-15 DIAGNOSIS — G4700 Insomnia, unspecified: Secondary | ICD-10-CM

## 2017-03-22 ENCOUNTER — Other Ambulatory Visit (INDEPENDENT_AMBULATORY_CARE_PROVIDER_SITE_OTHER): Payer: 59

## 2017-03-22 ENCOUNTER — Other Ambulatory Visit: Payer: Self-pay | Admitting: Primary Care

## 2017-03-22 DIAGNOSIS — E119 Type 2 diabetes mellitus without complications: Secondary | ICD-10-CM | POA: Diagnosis not present

## 2017-03-22 DIAGNOSIS — E876 Hypokalemia: Secondary | ICD-10-CM

## 2017-03-22 LAB — HEMOGLOBIN A1C: Hgb A1c MFr Bld: 8.1 % — ABNORMAL HIGH (ref 4.6–6.5)

## 2017-03-22 LAB — BASIC METABOLIC PANEL
BUN: 16 mg/dL (ref 6–23)
CHLORIDE: 100 meq/L (ref 96–112)
CO2: 28 mEq/L (ref 19–32)
Calcium: 9.7 mg/dL (ref 8.4–10.5)
Creatinine, Ser: 0.93 mg/dL (ref 0.40–1.20)
GFR: 82.06 mL/min (ref >60.00)
Glucose, Bld: 177 mg/dL — ABNORMAL HIGH (ref 70–99)
POTASSIUM: 3.1 meq/L — AB (ref 3.5–5.1)
SODIUM: 138 meq/L (ref 135–145)

## 2017-03-22 LAB — MICROALBUMIN / CREATININE URINE RATIO
CREATININE, U: 117.2 mg/dL
Microalb Creat Ratio: 0.6 mg/g (ref 0.0–30.0)

## 2017-04-03 ENCOUNTER — Encounter: Payer: Self-pay | Admitting: Obstetrics and Gynecology

## 2017-04-03 ENCOUNTER — Ambulatory Visit (INDEPENDENT_AMBULATORY_CARE_PROVIDER_SITE_OTHER): Payer: 59 | Admitting: Obstetrics and Gynecology

## 2017-04-03 VITALS — BP 118/73 | HR 81 | Ht 69.0 in | Wt 219.7 lb

## 2017-04-03 DIAGNOSIS — N939 Abnormal uterine and vaginal bleeding, unspecified: Secondary | ICD-10-CM

## 2017-04-03 DIAGNOSIS — Z9889 Other specified postprocedural states: Secondary | ICD-10-CM | POA: Diagnosis not present

## 2017-04-03 NOTE — Patient Instructions (Signed)
1. Maintain menstrual calendar monitoring for any abnormal uterine bleeding 2. Ultrasound is scheduled for the first week of January 2019 3. Return in 1 week after ultrasound for review of menstrual calendar and further management planning

## 2017-04-03 NOTE — Progress Notes (Signed)
Chief complaint: 1. Abnormal uterine bleeding 2. History of endometrial ablation  Patient presents for 3 month follow-up. She has been having abnormal uterine bleeding following her endometrial ablation done over 1 year ago. Bleeding is characterized as every couple weeks with 1 day of moderate bleeding and several days of spotting. She occasionally has cramps associated with the bleeding. Patient denies any significant hot flashes or night sweats.  01/14/2017 ultrasound demonstrated a 2.4 mm endometrial stripe and a uterine fibroid.  Past medical history, past surgical history, problem list, medications, and allergies are reviewed  OBJECTIVE: BP 118/73   Pulse 81   Ht 5\' 9"  (1.753 m)   Wt 219 lb 11.2 oz (99.7 kg)   LMP 03/27/2017 (Approximate)   BMI 32.44 kg/m  Physical exam is deferred   ASSESSMENT: 1. History of endometrial ablation 2. Abnormal uterine bleeding, mild 3. Ultrasound 2 months ago demonstrated a thin endometrial stripe measuring 2.4 mm 4. No evidence of menopause based on vasomotor symptoms  PLAN: 1. Maintain menstrual calendar monitoring over the next several months 2. Obtain pelvic ultrasound in January 2019 3. Follow-up 1 week after ultrasound for further management planning. 4. Consider obtaining [place] at that time if she has vasomotor symptoms  A total of 15 minutes were spent face-to-face with the patient during this encounter and over half of that time dealt with counseling and coordination of care.  Brayton Mars, MD  Note: This dictation was prepared with Dragon dictation along with smaller phrase technology. Any transcriptional errors that result from this process are unintentional.

## 2017-04-04 ENCOUNTER — Encounter: Payer: 59 | Admitting: Obstetrics and Gynecology

## 2017-04-11 ENCOUNTER — Other Ambulatory Visit: Payer: 59

## 2017-04-16 ENCOUNTER — Other Ambulatory Visit: Payer: Self-pay | Admitting: Primary Care

## 2017-04-16 DIAGNOSIS — I10 Essential (primary) hypertension: Secondary | ICD-10-CM

## 2017-05-09 LAB — HM DIABETES EYE EXAM

## 2017-05-11 ENCOUNTER — Other Ambulatory Visit: Payer: Self-pay | Admitting: Primary Care

## 2017-05-11 DIAGNOSIS — E119 Type 2 diabetes mellitus without complications: Secondary | ICD-10-CM

## 2017-06-21 ENCOUNTER — Ambulatory Visit: Payer: 59 | Admitting: Primary Care

## 2017-06-27 ENCOUNTER — Encounter: Payer: Self-pay | Admitting: Primary Care

## 2017-06-27 ENCOUNTER — Ambulatory Visit: Payer: Self-pay | Admitting: Primary Care

## 2017-06-27 VITALS — BP 118/78 | HR 80 | Temp 98.0°F | Ht 69.0 in | Wt 217.8 lb

## 2017-06-27 DIAGNOSIS — F411 Generalized anxiety disorder: Secondary | ICD-10-CM | POA: Diagnosis not present

## 2017-06-27 DIAGNOSIS — E119 Type 2 diabetes mellitus without complications: Secondary | ICD-10-CM

## 2017-06-27 LAB — BASIC METABOLIC PANEL
BUN: 13 mg/dL (ref 6–23)
CALCIUM: 9.3 mg/dL (ref 8.4–10.5)
CO2: 28 mEq/L (ref 19–32)
CREATININE: 0.87 mg/dL (ref 0.40–1.20)
Chloride: 102 mEq/L (ref 96–112)
GFR: 88.53 mL/min (ref >60.00)
GLUCOSE: 199 mg/dL — AB (ref 70–99)
Potassium: 3.2 mEq/L — ABNORMAL LOW (ref 3.5–5.1)
Sodium: 139 mEq/L (ref 135–145)

## 2017-06-27 LAB — LIPID PANEL
CHOL/HDL RATIO: 2
Cholesterol: 96 mg/dL (ref 0–200)
HDL: 44.3 mg/dL (ref >39.00)
LDL CALC: 38 mg/dL (ref 0–99)
NonHDL: 51.9
TRIGLYCERIDES: 68 mg/dL (ref 0.0–149.0)
VLDL: 13.6 mg/dL (ref 0.0–40.0)

## 2017-06-27 LAB — HEMOGLOBIN A1C: Hgb A1c MFr Bld: 8.4 % — ABNORMAL HIGH (ref 4.6–6.5)

## 2017-06-27 MED ORDER — GLIPIZIDE ER 10 MG PO TB24: ORAL_TABLET | ORAL | 1 refills | Status: DC

## 2017-06-27 MED ORDER — ESCITALOPRAM OXALATE 10 MG PO TABS: 10.0000 mg | ORAL_TABLET | Freq: Every day | ORAL | 1 refills | Status: DC

## 2017-06-27 NOTE — Progress Notes (Signed)
Subjective:    Patient ID: Michelle Marshall, female    DOB: 12-03-1965, 51 y.o.   MRN: [id number]  HPI  Michelle Marshall is a 51 year old female who presents today for follow up.  1) Type 2 Diabetes:  Current medications include: Invokana 150 mg, glipizide XL10 mg, Jentadueto 2.11-998 mg BID.  She is checking her blood glucose once daily and is getting readings of: Fasting in the morning: 130-140, occasional 180  Last A1C: 8.1 in September 2018 Last Eye Exam: Completed in November 2018 Last Foot Exam: June 2018 Pneumonia Vaccination: Completed Pneumovax in June 2018 ACE/ARB: Negative Urine Microalbumin in 2018 Statin: None, LDL at goal in April 2017  Diet currently consists of:  Breakfast: Oatmeal Lunch: Salad, sandwiches, fast food Dinner: Salad, sandwiches, restaurants Snacks: Granola bar, sometimes two Desserts: 1-3 times weekly Beverages: Water, coffee with creamer  Exercise: She is not currently exercising    Review of Systems  Eyes: Negative for visual disturbance.  Respiratory: Negative for shortness of breath.   Cardiovascular: Negative for chest pain.  Neurological: Negative for dizziness, numbness and headaches.       Past Medical History:  Diagnosis Date  . Anemia   . Complication of anesthesia    DURING SCAR TISSUE EXCISION FROM FALLOPIAN TUBES(2002), PT WAS GIVEN GENERAL ANESTHESIA AND PT BEGAN HAVING A REACTION TO THE ANESTHESIA AND LIPS AND TONGUE STARTED SWELLING AND THE SURGERY HAD TO BE STOPPED DUE TO AIRWAY CLOSING UP-PT HAD GA PRIOR TO THIS FOR HER GALLBLADDER WITH NO PROBLEM PREVIOUSLY  . Diabetes mellitus without complication ([facility])   . Dysrhythmia    H/O TACHYCARDIA  . Family history of adverse reaction to anesthesia    [place] UP  . Hypertension   . Irregular periods   . Vitamin D deficiency      Social History   Socioeconomic History  . Marital status: Married    Spouse name: Not on file  . Number of children:  Not on file  . Years of education: Not on file  . Highest education level: Not on file  Social Needs  . Financial resource strain: Not on file  . Food insecurity - worry: Not on file  . Food insecurity - inability: Not on file  . Transportation needs - medical: Not on file  . Transportation needs - non-medical: Not on file  Occupational History  . Not on file  Tobacco Use  . Smoking status: Former Smoker    Years: 20.00    Types: Cigarettes    Last attempt to quit: 09/05/2015    Years since quitting: 1.8  . Smokeless tobacco: Never Used  . Tobacco comment: 2-3 CIGARETES,DAILY. trying to quit....was using chantrix  Substance and Sexual Activity  . Alcohol use: No    Alcohol/week: 0.0 oz  . Drug use: No  . Sexual activity: Yes    Birth control/protection: None  Other Topics Concern  . Not on file  Social History Narrative   Married.   1 child.   She works as a Designer, industrial/product   Enjoys shopping, traveling.     Past Surgical History:  Procedure Laterality Date  . CHOLECYSTECTOMY    . [place] N/A 09/19/2015   Procedure: DILATATION & CURETTAGE/HYSTEROSCOPY WITH NOVASURE ABLATION;  Surgeon: Brayton Mars, MD;  Location: [facility] ORS;  Service: Gynecology;  Laterality: N/A;  . JOINT REPLACEMENT Left    30 years ago  . KNEE ARTHROSCOPY Left  Family History  Problem Relation Age of Onset  . Cancer Father        throat  . Hypertension Mother   . Diabetes Paternal Grandmother     Allergies  Allergen Reactions  . Lisinopril Swelling  . Losartan Other (See Comments)    Mouth swollen, rash and itching  . Wellbutrin [Bupropion] Hives and Swelling    Current Outpatient Medications on File Prior to Visit  Medication Sig Dispense Refill  . amLODipine (NORVASC) 10 MG tablet TAKE 1 TABLET(10 MG) BY MOUTH DAILY 90 tablet 1  . canagliflozin (INVOKANA) 300 MG TABS tablet Take 1 tablet (300 mg total) by mouth daily before  breakfast. 90 tablet 1  . carvedilol (COREG) 12.5 MG tablet TAKE 1 TABLET(12.5 MG) BY MOUTH TWICE DAILY WITH A MEAL 180 tablet 2  . EPINEPHrine (EPIPEN IJ) Inject as directed as needed.    . hydrochlorothiazide (HYDRODIURIL) 25 MG tablet TAKE 1 TABLET BY MOUTH DAILY 90 tablet 1  . ibuprofen (ADVIL,MOTRIN) 800 MG tablet TAKE 1 TABLET(800 MG) BY MOUTH EVERY 8 HOURS AS NEEDED 30 tablet 0  . JENTADUETO 2.11-998 MG TABS TAKE 1 TABLET BY MOUTH TWICE DAILY 180 tablet 1  . traZODone (DESYREL) 100 MG tablet TAKE 1 TABLET BY MOUTH AT BEDTIME AS NEEDED FOR SLEEP 90 tablet 0   No current facility-administered medications on file prior to visit.     BP 118/78   Pulse 80   Temp 98 F (36.7 C) (Oral)   Ht 5\' 9"  (1.753 m)   Wt 217 lb 12.8 oz (98.8 kg)   SpO2 97%   BMI 32.16 kg/m    Objective:   Physical Exam  Constitutional: She appears well-nourished.  Neck: Neck supple.  Cardiovascular: Normal rate and regular rhythm.  Pulmonary/Chest: Effort normal and breath sounds normal.  Skin: Skin is warm and dry.  Psychiatric: She has a normal mood and affect.          Assessment & Plan:

## 2017-06-27 NOTE — Assessment & Plan Note (Signed)
Refilled lexapro, feels well managed.

## 2017-06-27 NOTE — Patient Instructions (Addendum)
Stop by the lab prior to leaving today. I will notify you of your results once received.   I'll send in the Invokana prescription once I receive your potassium levels.  Start exercising. You should be getting 150 minutes of moderate intensity exercise weekly.  It's important to improve your diet by reducing consumption of fast food, fried food, processed snack foods, sugary drinks. Increase consumption of fresh vegetables and fruits, whole grains, water.  Ensure you are drinking 64 ounces of water daily.  Please schedule a follow up appointment in 6 months.  It was a pleasure to see you today!   Diabetes Mellitus and Nutrition When you have diabetes (diabetes mellitus), it is very important to have healthy eating habits because your blood sugar (glucose) levels are greatly affected by what you eat and drink. Eating healthy foods in the appropriate amounts, at about the same times every day, can help you:  Control your blood glucose.  Lower your risk of heart disease.  Improve your blood pressure.  Reach or maintain a healthy weight.  Every person with diabetes is different, and each person has different needs for a meal plan. Your health care provider may recommend that you work with a diet and nutrition specialist (dietitian) to make a meal plan that is best for you. Your meal plan may vary depending on factors such as:  The calories you need.  The medicines you take.  Your weight.  Your blood glucose, blood pressure, and cholesterol levels.  Your activity level.  Other health conditions you have, such as heart or kidney disease.  How do carbohydrates affect me? Carbohydrates affect your blood glucose level more than any other type of food. Eating carbohydrates naturally increases the amount of glucose in your blood. Carbohydrate counting is a method for keeping track of how many carbohydrates you eat. Counting carbohydrates is important to keep your blood glucose at a  healthy level, especially if you use insulin or take certain oral diabetes medicines. It is important to know how many carbohydrates you can safely have in each meal. This is different for every person. Your dietitian can help you calculate how many carbohydrates you should have at each meal and for snack. Foods that contain carbohydrates include:  Bread, cereal, rice, pasta, and crackers.  Potatoes and corn.  Peas, beans, and lentils.  Milk and yogurt.  Fruit and juice.  Desserts, such as cakes, cookies, ice cream, and candy.  How does alcohol affect me? Alcohol can cause a sudden decrease in blood glucose (hypoglycemia), especially if you use insulin or take certain oral diabetes medicines. Hypoglycemia can be a life-threatening condition. Symptoms of hypoglycemia (sleepiness, dizziness, and confusion) are similar to symptoms of having too much alcohol. If your health care provider says that alcohol is safe for you, follow these guidelines:  Limit alcohol intake to no more than 1 drink per day for nonpregnant women and 2 drinks per day for men. One drink equals 12 oz of beer, 5 oz of wine, or 1 oz of hard liquor.  Do not drink on an empty stomach.  Keep yourself hydrated with water, diet soda, or unsweetened iced tea.  Keep in mind that regular soda, juice, and other mixers may contain a lot of sugar and must be counted as carbohydrates.  What are tips for following this plan? Reading food labels  Start by checking the serving size on the label. The amount of calories, carbohydrates, fats, and other nutrients listed on the label are  based on one serving of the food. Many foods contain more than one serving per package.  Check the total grams (g) of carbohydrates in one serving. You can calculate the number of servings of carbohydrates in one serving by dividing the total carbohydrates by 15. For example, if a food has 30 g of total carbohydrates, it would be equal to 2 servings of  carbohydrates.  Check the number of grams (g) of saturated and trans fats in one serving. Choose foods that have low or no amount of these fats.  Check the number of milligrams (mg) of sodium in one serving. Most people should limit total sodium intake to less than 2,300 mg per day.  Always check the nutrition information of foods labeled as "low-fat" or "nonfat". These foods may be higher in added sugar or refined carbohydrates and should be avoided.  Talk to your dietitian to identify your daily goals for nutrients listed on the label. Shopping  Avoid buying canned, premade, or processed foods. These foods tend to be high in fat, sodium, and added sugar.  Shop around the outside edge of the grocery store. This includes fresh fruits and vegetables, bulk grains, fresh meats, and fresh dairy. Cooking  Use low-heat cooking methods, such as baking, instead of high-heat cooking methods like deep frying.  Cook using healthy oils, such as olive, canola, or sunflower oil.  Avoid cooking with butter, cream, or high-fat meats. Meal planning  Eat meals and snacks regularly, preferably at the same times every day. Avoid going long periods of time without eating.  Eat foods high in fiber, such as fresh fruits, vegetables, beans, and whole grains. Talk to your dietitian about how many servings of carbohydrates you can eat at each meal.  Eat 4-6 ounces of lean protein each day, such as lean meat, chicken, fish, eggs, or tofu. 1 ounce is equal to 1 ounce of meat, chicken, or fish, 1 egg, or 1/4 cup of tofu.  Eat some foods each day that contain healthy fats, such as avocado, nuts, seeds, and fish. Lifestyle   Check your blood glucose regularly.  Exercise at least 30 minutes 5 or more days each week, or as told by your health care provider.  Take medicines as told by your health care provider.  Do not use any products that contain nicotine or tobacco, such as cigarettes and e-cigarettes. If  you need help quitting, ask your health care provider.  Work with a Social worker or diabetes educator to identify strategies to manage stress and any emotional and social challenges. What are some questions to ask my health care provider?  Do I need to meet with a diabetes educator?  Do I need to meet with a dietitian?  What number can I call if I have questions?  When are the best times to check my blood glucose? Where to find more information:  American Diabetes Association: diabetes.org/food-and-fitness/food  Academy of Nutrition and Dietetics: PokerClues.dk  [employer] of Diabetes and Digestive and Kidney Diseases (NIH): ContactWire.be Summary  A healthy meal plan will help you control your blood glucose and maintain a healthy lifestyle.  Working with a diet and nutrition specialist (dietitian) can help you make a meal plan that is best for you.  Keep in mind that carbohydrates and alcohol have immediate effects on your blood glucose levels. It is important to count carbohydrates and to use alcohol carefully. This information is not intended to replace advice given to you by your health care provider. Make  sure you discuss any questions you have with your health care provider. Document Released: 03/22/2005 Document Revised: 07/30/2016 Document Reviewed: 07/30/2016 Elsevier Interactive Patient Education  [employer].

## 2017-06-27 NOTE — Assessment & Plan Note (Signed)
Due for repeat A1C today. Will continue Glipizide 10 mg, Invokana at 150 mg, and Jentadueto for now. Will send refills for Invokana once potassium level has been received.  Discussed the importance of a healthy diet and regular exercise in order for weight loss to reduce blood sugars.  Discussed that insulin would be the next best step to reduce her glucose levels, she kindly declines insulin.   Will await A1C results.

## 2017-07-01 ENCOUNTER — Other Ambulatory Visit: Payer: Self-pay | Admitting: Primary Care

## 2017-07-01 DIAGNOSIS — E118 Type 2 diabetes mellitus with unspecified complications: Secondary | ICD-10-CM

## 2017-07-05 ENCOUNTER — Other Ambulatory Visit: Payer: Self-pay | Admitting: Primary Care

## 2017-07-05 DIAGNOSIS — G4700 Insomnia, unspecified: Secondary | ICD-10-CM

## 2017-07-05 NOTE — Telephone Encounter (Signed)
Request for trazodone last refilled # 90 on 03/15/17. Last seen 06/27/17.Please advise.

## 2017-07-05 NOTE — Telephone Encounter (Signed)
Refill sent to pharmacy.   

## 2017-07-10 ENCOUNTER — Other Ambulatory Visit: Payer: 59

## 2017-07-17 ENCOUNTER — Encounter: Payer: 59 | Admitting: Obstetrics and Gynecology

## 2017-07-22 ENCOUNTER — Other Ambulatory Visit: Payer: 59

## 2017-09-04 ENCOUNTER — Encounter: Payer: Self-pay | Admitting: Family Medicine

## 2017-09-04 ENCOUNTER — Ambulatory Visit: Payer: 59 | Admitting: Family Medicine

## 2017-09-04 VITALS — BP 128/72 | HR 78 | Temp 98.1°F | Wt 229.2 lb

## 2017-09-04 DIAGNOSIS — N3090 Cystitis, unspecified without hematuria: Secondary | ICD-10-CM

## 2017-09-04 DIAGNOSIS — R300 Dysuria: Secondary | ICD-10-CM

## 2017-09-04 LAB — POC URINALSYSI DIPSTICK (AUTOMATED)
BILIRUBIN UA: NEGATIVE
GLUCOSE UA: NEGATIVE
KETONES UA: POSITIVE
NITRITE UA: POSITIVE
Spec Grav, UA: >=1.03 — AB (ref 1.010–1.025)
Urobilinogen, UA: 0.2 E.U./dL
pH, UA: 5.5 (ref 5.0–8.0)

## 2017-09-04 MED ORDER — NITROFURANTOIN MONOHYD MACRO 100 MG PO CAPS: 100.0000 mg | ORAL_CAPSULE | Freq: Two times a day (BID) | ORAL | 0 refills | Status: DC

## 2017-09-04 NOTE — Patient Instructions (Signed)

## 2017-09-04 NOTE — Progress Notes (Signed)
Subjective:    Patient ID: Michelle Marshall, female    DOB: [birth year], 52 y.o.   MRN: [id number]  HPI This is a 52 yo female who presents today with abdominal cramping and dysuria x several days. Lower RLQ pain last night, crampy. Took ibuprofen with little relief. No fever, but doesn't feel well. Low back pain. No nausea or vomiting. Started period a couple of days ago. Had uterine ablation last year, periods very irregular. No heavy bleeding.     Past Medical History:  Diagnosis Date  . Anemia   . Complication of anesthesia    DURING SCAR TISSUE EXCISION FROM FALLOPIAN TUBES(2002), PT WAS GIVEN GENERAL ANESTHESIA AND PT BEGAN HAVING A REACTION TO THE ANESTHESIA AND LIPS AND TONGUE STARTED SWELLING AND THE SURGERY HAD TO BE STOPPED DUE TO AIRWAY CLOSING UP-PT HAD GA PRIOR TO THIS FOR HER GALLBLADDER WITH NO PROBLEM PREVIOUSLY  . Diabetes mellitus without complication ([place])   . Dysrhythmia    H/O TACHYCARDIA  . Family history of adverse reaction to anesthesia    [place] UP  . Hypertension   . Irregular periods   . Vitamin D deficiency    Past Surgical History:  Procedure Laterality Date  . CHOLECYSTECTOMY    . [place] N/A 09/19/2015   Procedure: DILATATION & CURETTAGE/HYSTEROSCOPY WITH NOVASURE ABLATION;  Surgeon: Brayton Mars, MD;  Location: [facility] ORS;  Service: Gynecology;  Laterality: N/A;  . JOINT REPLACEMENT Left    30 years ago  . KNEE ARTHROSCOPY Left    Family History  Problem Relation Age of Onset  . Cancer Father        throat  . Hypertension Mother   . Diabetes Paternal Grandmother    Social History   Tobacco Use  . Smoking status: Former Smoker    Years: 20.00    Types: Cigarettes    Last attempt to quit: 09/05/2015    Years since quitting: 2.0  . Smokeless tobacco: Never Used  . Tobacco comment: 2-3 CIGARETES,DAILY. trying to quit....was using chantrix  Substance Use Topics  .  Alcohol use: No    Alcohol/week: 0.0 oz  . Drug use: No      Review of Systems Per HPI    Objective:   Physical Exam Physical Exam  Constitutional: She is oriented to person, place, and time. She appears well-developed and well-nourished. No distress.  HENT:  Head: Normocephalic and atraumatic.  Cardiovascular: Normal rate, regular rhythm and normal heart sounds.   Pulmonary/Chest: Effort normal and breath sounds normal.  Abdominal: Soft. She exhibits no distension. There is no tenderness. There is no rebound, no guarding and no CVA tenderness.  Neurological: She is alert and oriented to person, place, and time.  Skin: Skin is warm and dry. She is not diaphoretic.  Psychiatric: She has a normal mood and affect. Her behavior is normal. Judgment and thought content normal.  Vitals reviewed.     BP 128/72   Pulse 78   Temp 98.1 F (36.7 C) (Oral)   Wt 229 lb 4 oz (104 kg)   SpO2 95%   BMI 33.85 kg/m  Wt Readings from Last 3 Encounters:  09/04/17 229 lb 4 oz (104 kg)  06/27/17 217 lb 12.8 oz (98.8 kg)  04/03/17 219 lb 11.2 oz (99.7 kg)   Results for orders placed or performed in visit on 09/04/17  POCT Urinalysis Dipstick (Automated)  Result Value Ref Range   Color, UA red  Clarity, UA cloudy    Glucose, UA neg    Bilirubin, UA neg    Ketones, UA pos    Spec Grav, UA >=1.030 (A) 1.010 - 1.025   Blood, UA 3+    pH, UA 5.5 5.0 - 8.0   Protein, UA 2+    Urobilinogen, UA 0.2 0.2 or 1.0 E.U./dL   Nitrite, UA pos    Leukocytes, UA Moderate (2+) (A) Negative       Assessment & Plan:  1. Dysuria - POCT Urinalysis Dipstick (Automated)  2. Cystitis - Provided written and verbal information regarding diagnosis and treatment. - RTC/ER precautions reviewed - nitrofurantoin, macrocrystal-monohydrate, (MACROBID) 100 MG capsule; Take 1 capsule (100 mg total) by mouth 2 (two) times daily.  Dispense: 14 capsule; Refill: 0 - Urine Culture  - she will follow up with gyn  for irregular periods  Clarene Reamer, FNP-BC  [facility]  09/04/2017 1:00 PM

## 2017-09-05 ENCOUNTER — Encounter: Payer: Self-pay | Admitting: Obstetrics and Gynecology

## 2017-09-05 ENCOUNTER — Ambulatory Visit (INDEPENDENT_AMBULATORY_CARE_PROVIDER_SITE_OTHER): Payer: 59 | Admitting: Obstetrics and Gynecology

## 2017-09-05 ENCOUNTER — Ambulatory Visit (INDEPENDENT_AMBULATORY_CARE_PROVIDER_SITE_OTHER): Payer: 59

## 2017-09-05 VITALS — BP 122/77 | HR 81 | Ht 69.0 in | Wt 228.0 lb

## 2017-09-05 DIAGNOSIS — Z9889 Other specified postprocedural states: Secondary | ICD-10-CM | POA: Diagnosis not present

## 2017-09-05 DIAGNOSIS — D259 Leiomyoma of uterus, unspecified: Secondary | ICD-10-CM

## 2017-09-05 DIAGNOSIS — N939 Abnormal uterine and vaginal bleeding, unspecified: Secondary | ICD-10-CM | POA: Diagnosis not present

## 2017-09-05 DIAGNOSIS — N882 Stricture and stenosis of cervix uteri: Secondary | ICD-10-CM

## 2017-09-05 LAB — URINE CULTURE
MICRO NUMBER:: 90256605
SPECIMEN QUALITY: ADEQUATE

## 2017-09-05 NOTE — Progress Notes (Signed)
Chief complaint: 1.  Abnormal uterine bleeding 2.  History of endometrial ablation  Michelle Marshall is status post NovaSure endometrial ablation approximately 2 years ago.  She has been having continued abnormal uterine bleeding occurring every couple weeks with 1 or 2 days of moderate bleeding followed by several days of spotting associated with significant pelvic cramping.  This has persisted over the past 3 months.  Back in September 2018, pelvic ultrasound demonstrated a thin endometrial stripe measuring 2.7 mm and multiple uterine fibroids.  Endometrial biopsy was deferred at that time.  Because of the ongoing persistent symptoms, further investigation is now indicated.  Past Medical History:  Diagnosis Date  . Anemia   . Complication of anesthesia    DURING SCAR TISSUE EXCISION FROM FALLOPIAN TUBES(2002), PT WAS GIVEN GENERAL ANESTHESIA AND PT BEGAN HAVING A REACTION TO THE ANESTHESIA AND LIPS AND TONGUE STARTED SWELLING AND THE SURGERY HAD TO BE STOPPED DUE TO AIRWAY CLOSING UP-PT HAD GA PRIOR TO THIS FOR HER GALLBLADDER WITH NO PROBLEM PREVIOUSLY  . Diabetes mellitus without complication ([place])   . Dysrhythmia    H/O TACHYCARDIA  . Family history of adverse reaction to anesthesia    [place] UP  . Hypertension   . Irregular periods   . Vitamin D deficiency    Past Surgical History:  Procedure Laterality Date  . CHOLECYSTECTOMY    . [place] N/A 09/19/2015   Procedure: DILATATION & CURETTAGE/HYSTEROSCOPY WITH NOVASURE ABLATION;  Surgeon: Brayton Mars, MD;  Location: [facility] ORS;  Service: Gynecology;  Laterality: N/A;  . JOINT REPLACEMENT Left    30 years ago  . KNEE ARTHROSCOPY Left    OBJECTIVE: BP 122/77   Pulse 81   Ht 5\' 9"  (1.753 m)   Wt 228 lb (103.4 kg)   LMP 09/02/2017 (Exact Date)   BMI 33.67 kg/m  Pleasant female no acute distress.  Alert and oriented. Abdomen: Soft, nontender without  organomegaly Pelvic exam: External genitalia-normal BUS-normal Vagina-normal Cervix-no lesions; prominent anterior cervical lip; cervical stenosis identified with inability to pass Milex  pipetteand uterine sound Uterus-midplane, normal size and shape, mobile, nontender Adnexa-nonpalpable nontender Rectovaginal-normal external exam  Endometrial Biopsy Procedure Note  Pre-operative Diagnosis: Abnormal uterine bleeding status post endometrial ablation  Post-operative Diagnosis: same  Indications: abnormal uterine bleeding  Procedure Details   Urine pregnancy test was not done.  The risks (including infection, bleeding, pain, and uterine perforation) and benefits of the procedure were explained to the patient and Verbal informed consent was obtained.  Antibiotic prophylaxis against endocarditis was not indicated.   The patient was placed in the dorsal lithotomy position.  Bimanual exam showed the uterus to be in the neutral position.  A Graves' speculum inserted in the vagina.  Endocervical curettage with a Kevorkian curette was not performed.   A sharp tenaculum was applied to the anterior lip of the cervix for stabilization.  A Milex Pipelle could not be passed through the endocervical canal.  A sterile uterine sound was used and also was unable to be passed through the endocervical canal.  The endometrial biopsy was then discontinued because of severe cervical stenosis  Condition: Stable  Complications: None  Plan: Endometrial biopsy was aborted Tylenol and Motrin are recommended for any pelvic cramping Pelvic ultrasound is ordered and patient will follow-up after ultrasound for further management planning Attending Physician Documentation: Brayton Mars, MD  ASSESSMENT: 1.  Status post NovaSure endometrial ablation 2.  Chronic abnormal uterine bleeding 3.  History of uterine fibroid 4.  Unsuccessful endometrial biopsy attempt today 5.  Cervical stenosis  PLAN: 1.   Endometrial biopsy-discontinued 2.  Pelvic ultrasound ordered 3.  Return after ultrasound for further management planning  A total of 15 minutes were spent face-to-face with the patient during this encounter and over half of that time dealt with counseling and coordination of care.  Brayton Mars, MD  Note: This dictation was prepared with Dragon dictation along with smaller phrase technology. Any transcriptional errors that result from this process are unintentional.

## 2017-09-05 NOTE — Patient Instructions (Addendum)
1.  Endometrial biopsy is attempted today but discontinued because of cervical stenosis 2.  Pelvic ultrasound is ordered to assess for abnormal uterine bleeding 3.  Return 1 week after ultrasound for further management planning   Endometrial Biopsy, Care After This sheet gives you information about how to care for yourself after your procedure. Your health care provider may also give you more specific instructions. If you have problems or questions, contact your health care provider. What can I expect after the procedure? After the procedure, it is common to have:  Mild cramping.  A small amount of vaginal bleeding for a few days. This is normal.  Follow these instructions at home:  Take over-the-counter and prescription medicines only as told by your health care provider.  Do not douche, use tampons, or have sexual intercourse until your health care provider approves.  Return to your normal activities as told by your health care provider. Ask your health care provider what activities are safe for you.  Follow instructions from your health care provider about any activity restrictions, such as restrictions on strenuous exercise or heavy lifting. Contact a health care provider if:  You have heavy bleeding, or bleed for longer than 2 days after the procedure.  You have bad smelling discharge from your vagina.  You have a fever or chills.  You have a burning sensation when urinating or you have difficulty urinating.  You have severe pain in your lower abdomen. Get help right away if:  You have severe cramps in your stomach or back.  You pass large blood clots.  Your bleeding increases.  You become weak or light-headed, or you pass out. Summary  After the procedure, it is common to have mild cramping and a small amount of vaginal bleeding for a few days.  Do not douche, use tampons, or have sexual intercourse until your health care provider approves.  Return to your  normal activities as told by your health care provider. Ask your health care provider what activities are safe for you. This information is not intended to replace advice given to you by your health care provider. Make sure you discuss any questions you have with your health care provider. Document Released: 04/15/2013 Document Revised: 07/11/2016 Document Reviewed: 07/11/2016 Elsevier Interactive Patient Education  2017 [employer].

## 2017-09-12 ENCOUNTER — Encounter: Payer: Self-pay | Admitting: Obstetrics and Gynecology

## 2017-09-12 ENCOUNTER — Ambulatory Visit (INDEPENDENT_AMBULATORY_CARE_PROVIDER_SITE_OTHER): Payer: 59 | Admitting: Obstetrics and Gynecology

## 2017-09-12 VITALS — BP 120/79 | HR 77 | Ht 69.0 in | Wt 226.9 lb

## 2017-09-12 DIAGNOSIS — D250 Submucous leiomyoma of uterus: Secondary | ICD-10-CM | POA: Diagnosis not present

## 2017-09-12 DIAGNOSIS — Z9889 Other specified postprocedural states: Secondary | ICD-10-CM

## 2017-09-12 DIAGNOSIS — N939 Abnormal uterine and vaginal bleeding, unspecified: Secondary | ICD-10-CM | POA: Diagnosis not present

## 2017-09-12 DIAGNOSIS — R9389 Abnormal findings on diagnostic imaging of other specified body structures: Secondary | ICD-10-CM | POA: Diagnosis not present

## 2017-09-12 DIAGNOSIS — N882 Stricture and stenosis of cervix uteri: Secondary | ICD-10-CM | POA: Diagnosis not present

## 2017-09-12 NOTE — Patient Instructions (Signed)
1.  Hysteroscopy/D&C is to be scheduled within the next several weeks 2.  Return for preop appointment the week before surgery

## 2017-09-12 NOTE — Progress Notes (Signed)
Chief complaint: 1.  Follow-up on ultrasound 2.  Abnormal uterine bleeding 3.  Status post endometrial ablation 4.  Cervical stenosis 5.  Submucous leiomyoma  Michelle Marshall presents today for follow-up on pelvic ultrasound.  I previously attempted endocervical canal dilation and uterine sampling was aborted due to significant cervical stenosis and suspected uterine synechiae following an endometrial ablation procedure.  Pelvic ultrasound results: Study Result   ULTRASOUND REPORT  Location: [facility] Date of Service:  09/05/2017   Indications: AUB; status post endometrial ablation Findings:  The uterus measures 8.2 x 5.1 x 4.8 cm. Echo texture is heterogeneous with evidence of focal masses. Within the uterus are multiple suspected fibroids measuring: Fibroid 1: Posterior, submucosal, 2.2 x 2.0 x 2.2 cm Fibroid 2: Anterior, probable submucosal, 2.6 x 2.0 x 2.4 cm The Endometrium averages 7.2 mm.  A small amount of fluid is noted within the endometrium.  Right Ovary measures 2.5 x 1.5 x 1.4 cm. It is normal in appearance. Left Ovary measures 2.3 x 2.1 x 1.4 cm. It is normal appearance. Survey of the adnexa demonstrates no adnexal masses. There is no free fluid in the cul de sac.  Impression: 1. Anteverted uterus appears heterogeneous and contains multiple fibroids. 2. Posterior and anterior submucosal fibroids measuring 2.2 x 2.0 x 2.2 and 2.6 x 2.0 x 2.4 cm, respectively. 3. The endometrium appears thickened at 7.2 mm and contains a small amount of fluid.  The endometrium appears irregular due to the presence of fibroids. 4. Bilateral ovaries appear WNL.  Recommendations: 1.Clinical correlation with the patient's History and Physical Exam. 2.  Recommend endometrial sampling   Dario Ave, RDMS Brayton Mars, MD      OBJECTIVE: BP 120/79   Pulse 77   Ht 5\' 9"  (1.753 m)   Wt 226 lb 14.4 oz (102.9 kg)   LMP 09/02/2017 (Exact Date)   BMI 33.51  kg/m  Physical exam-deferred  ASSESSMENT: 1.  Abnormal uterine bleeding 2.  Status post endometrial ablation 3.  Cervical stenosis 4.  Submucous leiomyoma of uterine #5 abnormal pelvic ultrasound with thickened endometrium and endometrial fluid collection  PLAN: 1.  Recommend endometrial sampling through hysteroscopy/D&C with possible mild sure resection of uterine synechiae 2.  Return for preop appointment the week before surgery  A total of 15 minutes were spent face-to-face with the patient during this encounter and over half of that time dealt with counseling and coordination of care.  Brayton Mars, MD  Note: This dictation was prepared with Dragon dictation along with smaller phrase technology. Any transcriptional errors that result from this process are unintentional.

## 2017-09-24 ENCOUNTER — Ambulatory Visit (INDEPENDENT_AMBULATORY_CARE_PROVIDER_SITE_OTHER): Payer: 59 | Admitting: Obstetrics and Gynecology

## 2017-09-24 ENCOUNTER — Encounter: Payer: Self-pay | Admitting: Obstetrics and Gynecology

## 2017-09-24 VITALS — BP 102/68 | HR 92 | Ht 69.0 in | Wt 229.5 lb

## 2017-09-24 DIAGNOSIS — Z9889 Other specified postprocedural states: Secondary | ICD-10-CM

## 2017-09-24 DIAGNOSIS — D250 Submucous leiomyoma of uterus: Secondary | ICD-10-CM

## 2017-09-24 DIAGNOSIS — Z01818 Encounter for other preprocedural examination: Secondary | ICD-10-CM

## 2017-09-24 DIAGNOSIS — R9389 Abnormal findings on diagnostic imaging of other specified body structures: Secondary | ICD-10-CM

## 2017-09-24 DIAGNOSIS — N939 Abnormal uterine and vaginal bleeding, unspecified: Secondary | ICD-10-CM

## 2017-09-24 DIAGNOSIS — N882 Stricture and stenosis of cervix uteri: Secondary | ICD-10-CM

## 2017-09-24 NOTE — Progress Notes (Signed)
PREOPERATIVE HISTORY AND PHYSICAL  Date of surgery: 09/30/2017 Diagnosis: Abnormal uterine bleeding; cervical stenosis; status post endometrial ablation Procedure: Hysteroscopy/D&C with myosure   Patient is a 52 y.o. G2P0058female scheduled for hysteroscopy/D&C with possible mild sure resection for evaluation and management of abnormal uterine bleeding.  Previous attempts at endocervical canal dilation and uterine sampling in the office was unsuccessful due to severe cervical stenosis possibly related to uterine synechiae following endometrial ablation procedure.  Pelvic ultrasound on 09/05/2017 reveals posterior submucosal fibroid measuring 2.2 cm, and an endometrial stripe measuring 7.2 mm with some free fluid within the endometrial cavity.  The ovaries appeared normal bilaterally  OB History    Gravida Para Term Preterm AB Living   2       2     SAB TAB Ectopic Multiple Live Births   2              Patient's last menstrual period was 09/02/2017 (exact date).    Past Medical History:  Diagnosis Date  . Anemia   . Complication of anesthesia    DURING SCAR TISSUE EXCISION FROM FALLOPIAN TUBES(2002), PT WAS GIVEN GENERAL ANESTHESIA AND PT BEGAN HAVING A REACTION TO THE ANESTHESIA AND LIPS AND TONGUE STARTED SWELLING AND THE SURGERY HAD TO BE STOPPED DUE TO AIRWAY CLOSING UP-PT HAD GA PRIOR TO THIS FOR HER GALLBLADDER WITH NO PROBLEM PREVIOUSLY  . Diabetes mellitus without complication ([place])   . Dysrhythmia    H/O TACHYCARDIA  . Family history of adverse reaction to anesthesia    [place] UP  . Hypertension   . Irregular periods   . Vitamin D deficiency     Past Surgical History:  Procedure Laterality Date  . CHOLECYSTECTOMY    . [place] N/A 09/19/2015   Procedure: DILATATION & CURETTAGE/HYSTEROSCOPY WITH NOVASURE ABLATION;  Surgeon: Brayton Mars, MD;  Location: [facility] ORS;  Service: Gynecology;  Laterality:  N/A;  . JOINT REPLACEMENT Left    30 years ago  . KNEE ARTHROSCOPY Left     OB History  Gravida Para Term Preterm AB Living  2       2    SAB TAB Ectopic Multiple Live Births  2            # Outcome Date GA Lbr Len/2nd Weight Sex Delivery Anes PTL Lv  2 SAB           1 SAB               Social History   Socioeconomic History  . Marital status: Married    Spouse name: None  . Number of children: None  . Years of education: None  . Highest education level: None  Social Needs  . Financial resource strain: None  . Food insecurity - worry: None  . Food insecurity - inability: None  . Transportation needs - medical: None  . Transportation needs - non-medical: None  Occupational History  . None  Tobacco Use  . Smoking status: Former Smoker    Years: 20.00    Types: Cigarettes    Last attempt to quit: 09/05/2015    Years since quitting: 2.0  . Smokeless tobacco: Never Used  . Tobacco comment: 2-3 CIGARETES,DAILY. trying to quit....was using chantrix  Substance and Sexual Activity  . Alcohol use: No    Alcohol/week: 0.0 oz  . Drug use: No  . Sexual activity: Yes    Birth control/protection: None  Other Topics  Concern  . None  Social History Narrative   Married.   1 child.   She works as a Designer, industrial/product   Enjoys shopping, traveling.     Family History  Problem Relation Age of Onset  . Cancer Father        throat  . Hypertension Mother   . Diabetes Paternal Grandmother      (Not in a hospital admission)  Allergies  Allergen Reactions  . Lisinopril Swelling  . Losartan Other (See Comments)    Mouth swollen, rash and itching  . Wellbutrin [Bupropion] Hives and Swelling    Review of Systems Constitutional: No recent fever/chills/sweats Respiratory: No recent cough/bronchitis Cardiovascular: No chest pain Gastrointestinal: No recent nausea/vomiting/diarrhea Genitourinary: No UTI symptoms Hematologic/lymphatic:No history of coagulopathy or recent blood  thinner use    Objective:    BP 102/68   Pulse 92   Ht 5\' 9"  (1.753 m)   Wt 229 lb 8 oz (104.1 kg)   LMP 09/02/2017 (Exact Date)   BMI 33.89 kg/m   General:   Normal  Skin:   normal  HEENT:  Normal  Neck:  Supple without Adenopathy or Thyromegaly  Lungs:   Heart:              Breasts:   Abdomen:  Pelvis:  M/S   Extremeties:  Neuro:    clear to auscultation bilaterally   Normal without murmur   Not Examined   soft, non-tender; bowel sounds normal; no masses,  no organomegaly   Exam deferred to OR  No CVAT  Warm/Dry   Normal          Assessment:    1.  Abnormal uterine bleeding 2.  Status post endometrial ablation 3.  Cervical stenosis 4.  Submucous leiomyoma 2.2 cm 5.  Abnormal pelvic ultrasound with 7.5 mm endometrial stripe and endometrial fluid collection   Plan:  Hysteroscopy/D&C with possible myosure resection   Preop counseling: Patient is to undergo hysteroscopy/D&C with myosure section.  She is understanding of the planned procedures and is aware of and is accepting of all surgical risks which include but not limited to bleeding, infection, pelvic organ injury with need for repair, blood clot disorders, anesthesia risk, etc.  All questions have been answered.  Informed consent is given.  Patient is very willing to proceed with surgery as scheduled.  Note: This dictation was prepared with Dragon dictation along with smaller phrase technology. Any transcriptional errors that result from this process are unintentional.

## 2017-09-24 NOTE — H&P (View-Only) (Signed)
PREOPERATIVE HISTORY AND PHYSICAL  Date of surgery: 09/30/2017 Diagnosis: Abnormal uterine bleeding; cervical stenosis; status post endometrial ablation Procedure: Hysteroscopy/D&C with myosure   Patient is a 52 y.o. G2P008female scheduled for hysteroscopy/D&C with possible mild sure resection for evaluation and management of abnormal uterine bleeding.  Previous attempts at endocervical canal dilation and uterine sampling in the office was unsuccessful due to severe cervical stenosis possibly related to uterine synechiae following endometrial ablation procedure.  Pelvic ultrasound on 09/05/2017 reveals posterior submucosal fibroid measuring 2.2 cm, and an endometrial stripe measuring 7.2 mm with some free fluid within the endometrial cavity.  The ovaries appeared normal bilaterally  OB History    Gravida Para Term Preterm AB Living   2       2     SAB TAB Ectopic Multiple Live Births   2              Patient's last menstrual period was 09/02/2017 (exact date).    Past Medical History:  Diagnosis Date  . Anemia   . Complication of anesthesia    DURING SCAR TISSUE EXCISION FROM FALLOPIAN TUBES(2002), PT WAS GIVEN GENERAL ANESTHESIA AND PT BEGAN HAVING A REACTION TO THE ANESTHESIA AND LIPS AND TONGUE STARTED SWELLING AND THE SURGERY HAD TO BE STOPPED DUE TO AIRWAY CLOSING UP-PT HAD GA PRIOR TO THIS FOR HER GALLBLADDER WITH NO PROBLEM PREVIOUSLY  . Diabetes mellitus without complication ([place])   . Dysrhythmia    H/O TACHYCARDIA  . Family history of adverse reaction to anesthesia    [place] UP  . Hypertension   . Irregular periods   . Vitamin D deficiency     Past Surgical History:  Procedure Laterality Date  . CHOLECYSTECTOMY    . [place] N/A 09/19/2015   Procedure: DILATATION & CURETTAGE/HYSTEROSCOPY WITH NOVASURE ABLATION;  Surgeon: Brayton Mars, MD;  Location: [facility] ORS;  Service: Gynecology;  Laterality:  N/A;  . JOINT REPLACEMENT Left    30 years ago  . KNEE ARTHROSCOPY Left     OB History  Gravida Para Term Preterm AB Living  2       2    SAB TAB Ectopic Multiple Live Births  2            # Outcome Date GA Lbr Len/2nd Weight Sex Delivery Anes PTL Lv  2 SAB           1 SAB               Social History   Socioeconomic History  . Marital status: Married    Spouse name: None  . Number of children: None  . Years of education: None  . Highest education level: None  Social Needs  . Financial resource strain: None  . Food insecurity - worry: None  . Food insecurity - inability: None  . Transportation needs - medical: None  . Transportation needs - non-medical: None  Occupational History  . None  Tobacco Use  . Smoking status: Former Smoker    Years: 20.00    Types: Cigarettes    Last attempt to quit: 09/05/2015    Years since quitting: 2.0  . Smokeless tobacco: Never Used  . Tobacco comment: 2-3 CIGARETES,DAILY. trying to quit....was using chantrix  Substance and Sexual Activity  . Alcohol use: No    Alcohol/week: 0.0 oz  . Drug use: No  . Sexual activity: Yes    Birth control/protection: None  Other Topics  Concern  . None  Social History Narrative   Married.   1 child.   She works as a Designer, industrial/product   Enjoys shopping, traveling.     Family History  Problem Relation Age of Onset  . Cancer Father        throat  . Hypertension Mother   . Diabetes Paternal Grandmother      (Not in a hospital admission)  Allergies  Allergen Reactions  . Lisinopril Swelling  . Losartan Other (See Comments)    Mouth swollen, rash and itching  . Wellbutrin [Bupropion] Hives and Swelling    Review of Systems Constitutional: No recent fever/chills/sweats Respiratory: No recent cough/bronchitis Cardiovascular: No chest pain Gastrointestinal: No recent nausea/vomiting/diarrhea Genitourinary: No UTI symptoms Hematologic/lymphatic:No history of coagulopathy or recent blood  thinner use    Objective:    BP 102/68   Pulse 92   Ht 5\' 9"  (1.753 m)   Wt 229 lb 8 oz (104.1 kg)   LMP 09/02/2017 (Exact Date)   BMI 33.89 kg/m   General:   Normal  Skin:   normal  HEENT:  Normal  Neck:  Supple without Adenopathy or Thyromegaly  Lungs:   Heart:              Breasts:   Abdomen:  Pelvis:  M/S   Extremeties:  Neuro:    clear to auscultation bilaterally   Normal without murmur   Not Examined   soft, non-tender; bowel sounds normal; no masses,  no organomegaly   Exam deferred to OR  No CVAT  Warm/Dry   Normal          Assessment:    1.  Abnormal uterine bleeding 2.  Status post endometrial ablation 3.  Cervical stenosis 4.  Submucous leiomyoma 2.2 cm 5.  Abnormal pelvic ultrasound with 7.5 mm endometrial stripe and endometrial fluid collection   Plan:  Hysteroscopy/D&C with possible myosure resection   Preop counseling: Patient is to undergo hysteroscopy/D&C with myosure section.  She is understanding of the planned procedures and is aware of and is accepting of all surgical risks which include but not limited to bleeding, infection, pelvic organ injury with need for repair, blood clot disorders, anesthesia risk, etc.  All questions have been answered.  Informed consent is given.  Patient is very willing to proceed with surgery as scheduled.  Note: This dictation was prepared with Dragon dictation along with smaller phrase technology. Any transcriptional errors that result from this process are unintentional.

## 2017-09-24 NOTE — Patient Instructions (Signed)
1.  Return for postop check 1 week after surgery 

## 2017-09-24 NOTE — H&P (Signed)
PREOPERATIVE HISTORY AND PHYSICAL  Date of surgery: 09/30/2017 Diagnosis: Abnormal uterine bleeding; cervical stenosis; status post endometrial ablation Procedure: Hysteroscopy/D&C with myosure   Patient is a 52 y.o. G2P0047female scheduled for hysteroscopy/D&C with possible mild sure resection for evaluation and management of abnormal uterine bleeding.  Previous attempts at endocervical canal dilation and uterine sampling in the office was unsuccessful due to severe cervical stenosis possibly related to uterine synechiae following endometrial ablation procedure.  Pelvic ultrasound on 09/05/2017 reveals posterior submucosal fibroid measuring 2.2 cm, and an endometrial stripe measuring 7.2 mm with some free fluid within the endometrial cavity.  The ovaries appeared normal bilaterally  OB History    Gravida Para Term Preterm AB Living   2       2     SAB TAB Ectopic Multiple Live Births   2              Patient's last menstrual period was 09/02/2017 (exact date).    Past Medical History:  Diagnosis Date  . Anemia   . Complication of anesthesia    DURING SCAR TISSUE EXCISION FROM FALLOPIAN TUBES(2002), PT WAS GIVEN GENERAL ANESTHESIA AND PT BEGAN HAVING A REACTION TO THE ANESTHESIA AND LIPS AND TONGUE STARTED SWELLING AND THE SURGERY HAD TO BE STOPPED DUE TO AIRWAY CLOSING UP-PT HAD GA PRIOR TO THIS FOR HER GALLBLADDER WITH NO PROBLEM PREVIOUSLY  . Diabetes mellitus without complication ([place])   . Dysrhythmia    H/O TACHYCARDIA  . Family history of adverse reaction to anesthesia    [place] UP  . Hypertension   . Irregular periods   . Vitamin D deficiency     Past Surgical History:  Procedure Laterality Date  . CHOLECYSTECTOMY    . [place] N/A 09/19/2015   Procedure: DILATATION & CURETTAGE/HYSTEROSCOPY WITH NOVASURE ABLATION;  Surgeon: Brayton Mars, MD;  Location: [facility] ORS;  Service: Gynecology;  Laterality:  N/A;  . JOINT REPLACEMENT Left    30 years ago  . KNEE ARTHROSCOPY Left     OB History  Gravida Para Term Preterm AB Living  2       2    SAB TAB Ectopic Multiple Live Births  2            # Outcome Date GA Lbr Len/2nd Weight Sex Delivery Anes PTL Lv  2 SAB           1 SAB               Social History   Socioeconomic History  . Marital status: Married    Spouse name: None  . Number of children: None  . Years of education: None  . Highest education level: None  Social Needs  . Financial resource strain: None  . Food insecurity - worry: None  . Food insecurity - inability: None  . Transportation needs - medical: None  . Transportation needs - non-medical: None  Occupational History  . None  Tobacco Use  . Smoking status: Former Smoker    Years: 20.00    Types: Cigarettes    Last attempt to quit: 09/05/2015    Years since quitting: 2.0  . Smokeless tobacco: Never Used  . Tobacco comment: 2-3 CIGARETES,DAILY. trying to quit....was using chantrix  Substance and Sexual Activity  . Alcohol use: No    Alcohol/week: 0.0 oz  . Drug use: No  . Sexual activity: Yes    Birth control/protection: None  Other Topics  Concern  . None  Social History Narrative   Married.   1 child.   She works as a Designer, industrial/product   Enjoys shopping, traveling.     Family History  Problem Relation Age of Onset  . Cancer Father        throat  . Hypertension Mother   . Diabetes Paternal Grandmother      (Not in a hospital admission)  Allergies  Allergen Reactions  . Lisinopril Swelling  . Losartan Other (See Comments)    Mouth swollen, rash and itching  . Wellbutrin [Bupropion] Hives and Swelling    Review of Systems Constitutional: No recent fever/chills/sweats Respiratory: No recent cough/bronchitis Cardiovascular: No chest pain Gastrointestinal: No recent nausea/vomiting/diarrhea Genitourinary: No UTI symptoms Hematologic/lymphatic:No history of coagulopathy or recent blood  thinner use    Objective:    BP 102/68   Pulse 92   Ht 5\' 9"  (1.753 m)   Wt 229 lb 8 oz (104.1 kg)   LMP 09/02/2017 (Exact Date)   BMI 33.89 kg/m   General:   Normal  Skin:   normal  HEENT:  Normal  Neck:  Supple without Adenopathy or Thyromegaly  Lungs:   Heart:              Breasts:   Abdomen:  Pelvis:  M/S   Extremeties:  Neuro:    clear to auscultation bilaterally   Normal without murmur   Not Examined   soft, non-tender; bowel sounds normal; no masses,  no organomegaly   Exam deferred to OR  No CVAT  Warm/Dry   Normal          Assessment:    1.  Abnormal uterine bleeding 2.  Status post endometrial ablation 3.  Cervical stenosis 4.  Submucous leiomyoma 2.2 cm 5.  Abnormal pelvic ultrasound with 7.5 mm endometrial stripe and endometrial fluid collection   Plan:  Hysteroscopy/D&C with possible myosure resection   Preop counseling: Patient is to undergo hysteroscopy/D&C with myosure section.  She is understanding of the planned procedures and is aware of and is accepting of all surgical risks which include but not limited to bleeding, infection, pelvic organ injury with need for repair, blood clot disorders, anesthesia risk, etc.  All questions have been answered.  Informed consent is given.  Patient is very willing to proceed with surgery as scheduled.  Note: This dictation was prepared with Dragon dictation along with smaller phrase technology. Any transcriptional errors that result from this process are unintentional.

## 2017-09-26 ENCOUNTER — Encounter
Admission: RE | Admit: 2017-09-26 | Discharge: 2017-09-26 | Disposition: A | Discharge status: Home / Self Care | Payer: 59 | Source: Ambulatory Visit | Attending: Obstetrics and Gynecology | Admitting: Obstetrics and Gynecology

## 2017-09-26 ENCOUNTER — Other Ambulatory Visit: Payer: Self-pay

## 2017-09-26 DIAGNOSIS — I499 Cardiac arrhythmia, unspecified: Secondary | ICD-10-CM | POA: Diagnosis not present

## 2017-09-26 DIAGNOSIS — Z01810 Encounter for preprocedural cardiovascular examination: Secondary | ICD-10-CM | POA: Insufficient documentation

## 2017-09-26 DIAGNOSIS — Z01812 Encounter for preprocedural laboratory examination: Secondary | ICD-10-CM | POA: Insufficient documentation

## 2017-09-26 HISTORY — DX: Major depressive disorder, single episode, unspecified: F32.9

## 2017-09-26 HISTORY — DX: Depression, unspecified: F32.A

## 2017-09-26 LAB — CBC WITH DIFFERENTIAL/PLATELET
BASOS PCT: 1 %
Basophils Absolute: 0.1 10*3/uL (ref 0–0.1)
EOS ABS: 0.2 10*3/uL (ref 0–0.7)
EOS PCT: 2 %
HCT: 41 % (ref 35.0–47.0)
Hemoglobin: 13.8 g/dL (ref 12.0–16.0)
Lymphocytes Relative: 24 %
Lymphs Abs: 2.5 10*3/uL (ref 1.0–3.6)
MCH: 30.4 pg (ref 26.0–34.0)
MCHC: 33.7 g/dL (ref 32.0–36.0)
MCV: 90.2 fL (ref 80.0–100.0)
MONOS PCT: 9 %
Monocytes Absolute: 0.9 10*3/uL (ref 0.2–0.9)
Neutro Abs: 6.7 10*3/uL — ABNORMAL HIGH (ref 1.4–6.5)
Neutrophils Relative %: 64 %
PLATELETS: 319 10*3/uL (ref 150–440)
RBC: 4.54 MIL/uL (ref 3.80–5.20)
RDW: 13.3 % (ref 11.5–14.5)
WBC: 10.3 10*3/uL (ref 3.6–11.0)

## 2017-09-26 LAB — TYPE AND SCREEN
ABO/RH(D): AB POS
ANTIBODY SCREEN: NEGATIVE

## 2017-09-26 LAB — RAPID HIV SCREEN (HIV 1/2 AB+AG)
HIV 1/2 ANTIBODIES: NONREACTIVE
HIV-1 P24 Antigen - HIV24: NONREACTIVE

## 2017-09-26 MED ORDER — SODIUM CHLORIDE 0.9 % IV SOLN
INTRAVENOUS | Status: DC
  Filled 2017-09-26: qty 1000

## 2017-09-26 MED ORDER — LACTATED RINGERS IV SOLN: INTRAVENOUS | Status: DC

## 2017-09-26 NOTE — Patient Instructions (Signed)
Your procedure is scheduled on: Monday, MARCH 25  Report to [facility]  To find out your arrival time please call [phone] between              1PM - 3PM on Friday, MARCH 22  Remember: Instructions that are not followed completely may result in serious  medical risk, up to and including death, or upon the discretion of your surgeon  and anesthesiologist your surgery may need to be rescheduled.     _X__ 1. Do not eat food after midnight the night before your procedure.                 No gum chewing or hard candies.                   You may drink clear liquids up to 2 hours                 before you are scheduled to arrive for your surgery- DO not drink clear                 liquids within 2 hours of the start of your surgery.                  Clear Liquids include:  water, apple juice without pulp, clear carbohydrate                 drink such as Clearfast of Gatorade, Black Coffee or Tea (Do not add                 anything to coffee or tea).  __X__2.  On the morning of surgery brush your teeth with toothpaste and water,                        you may rinse your mouth with mouthwash if you wish.                             Do not swallow any toothpaste of mouthwash.     _X__ 3.  No Alcohol for 24 hours before or after surgery.   _X__ 4.  Do Not Smoke or use e-cigarettes For 24 Hours Prior to Your Surgery.                 Do not use any chewable tobacco products for at least 6 hours prior to                 surgery.  ____  5.  Bring all medications with you on the day of surgery if instructed.   __x__  6.  Notify your doctor if there is any change in your medical condition      (cold, fever, infections).     Do not wear jewelry, make-up, hairpins, clips or nail polish. Do not wear lotions, powders, or perfumes. You may wear deodorant. Do not shave 48 hours prior to surgery. Men may shave face and neck. Do not bring  valuables to the hospital.    [facility] is not responsible for any belongings or valuables.  Contacts, dentures or bridgework may not be worn into surgery. Leave your suitcase in the car. After surgery it may be brought to your room. For patients admitted to the hospital, discharge time is determined by your treatment team.   Patients discharged the day of  surgery will not be allowed to drive home.   Please read over the following fact sheets that you were given:   PREPARING FOR SURGERY   ____ Take these medicines the morning of surgery with A SIP OF WATER:    1. CARVEDILOL  2.   3.   4.  5.  6.  ____ Fleet Enema (as directed)   ____ Use CHG Soap as directed  ____ Use inhalers on the day of surgery  _X___ DO NOT TAKE GLIPIZIDE OR JENTADUETO ON MORNING OF SURGERY  _X___ Stop ASPIRIN NOW!!  _X___ Stop Anti-inflammatories NOW!!               THIS INCLUDES MOTRIN  TYLENOL IS OKAY   ____ Stop supplements until after surgery.    ____ Bring C-Pap to the hospital.   CONTINUE ALL OTHER MEDICINES AS USUAL.  NO HYDROCHLOROTHIAZIDE ON MORNING OF SURGERY  WEAR LOOSE AND COMFORTABLE CLOTHES

## 2017-09-26 NOTE — Pre-Procedure Instructions (Signed)
**   Patient expressed concern over anesthesia due to past history of a severe reaction to general anesthesia.  She was requesting the possibility of an epidural for her procedure. Please refer to notes in [facility] as to type of reaction she has previously had.

## 2017-09-27 LAB — RPR: RPR: NONREACTIVE

## 2017-09-30 ENCOUNTER — Ambulatory Visit: Payer: 59 | Admitting: Registered Nurse

## 2017-09-30 ENCOUNTER — Encounter: Payer: Self-pay | Admitting: Anesthesiology

## 2017-09-30 ENCOUNTER — Ambulatory Visit
Admission: RE | Admit: 2017-09-30 | Discharge: 2017-09-30 | Disposition: A | Discharge status: Home / Self Care | Payer: 59 | Source: Ambulatory Visit | Attending: Obstetrics and Gynecology | Admitting: Obstetrics and Gynecology

## 2017-09-30 ENCOUNTER — Encounter
Admission: RE | Disposition: A | Discharge status: Home / Self Care | Payer: Self-pay | Source: Ambulatory Visit | Attending: Obstetrics and Gynecology

## 2017-09-30 DIAGNOSIS — N858 Other specified noninflammatory disorders of uterus: Secondary | ICD-10-CM | POA: Insufficient documentation

## 2017-09-30 DIAGNOSIS — Z9889 Other specified postprocedural states: Secondary | ICD-10-CM

## 2017-09-30 DIAGNOSIS — N882 Stricture and stenosis of cervix uteri: Secondary | ICD-10-CM | POA: Diagnosis not present

## 2017-09-30 DIAGNOSIS — Z87891 Personal history of nicotine dependence: Secondary | ICD-10-CM | POA: Insufficient documentation

## 2017-09-30 DIAGNOSIS — Z7984 Long term (current) use of oral hypoglycemic drugs: Secondary | ICD-10-CM | POA: Insufficient documentation

## 2017-09-30 DIAGNOSIS — N838 Other noninflammatory disorders of ovary, fallopian tube and broad ligament: Secondary | ICD-10-CM | POA: Diagnosis not present

## 2017-09-30 DIAGNOSIS — Z888 Allergy status to other drugs, medicaments and biological substances status: Secondary | ICD-10-CM | POA: Insufficient documentation

## 2017-09-30 DIAGNOSIS — Z8249 Family history of ischemic heart disease and other diseases of the circulatory system: Secondary | ICD-10-CM | POA: Diagnosis not present

## 2017-09-30 DIAGNOSIS — E119 Type 2 diabetes mellitus without complications: Secondary | ICD-10-CM | POA: Diagnosis not present

## 2017-09-30 DIAGNOSIS — I10 Essential (primary) hypertension: Secondary | ICD-10-CM | POA: Diagnosis not present

## 2017-09-30 DIAGNOSIS — N939 Abnormal uterine and vaginal bleeding, unspecified: Secondary | ICD-10-CM | POA: Diagnosis not present

## 2017-09-30 HISTORY — PX: DILATATION & CURETTAGE/HYSTEROSCOPY WITH MYOSURE: SHX6511

## 2017-09-30 LAB — ABO/RH: ABO/RH(D): AB POS

## 2017-09-30 LAB — POCT PREGNANCY, URINE: PREG TEST UR: NEGATIVE

## 2017-09-30 LAB — GLUCOSE, CAPILLARY
Glucose-Capillary: 238 mg/dL — ABNORMAL HIGH (ref 65–99)
Glucose-Capillary: 220 mg/dL — ABNORMAL HIGH (ref 65–99)

## 2017-09-30 SURGERY — DILATATION & CURETTAGE/HYSTEROSCOPY WITH MYOSURE
Anesthesia: General | Wound class: Clean Contaminated

## 2017-09-30 MED ORDER — GLYCOPYRROLATE 0.2 MG/ML IJ SOLN
INTRAMUSCULAR | Status: DC | PRN
  Administered 2017-09-30: 0.2 mg via INTRAVENOUS

## 2017-09-30 MED ORDER — FENTANYL CITRATE (PF) 100 MCG/2ML IJ SOLN
25.0000 ug | INTRAMUSCULAR | Status: DC | PRN
  Administered 2017-09-30 (×4): 25 ug via INTRAVENOUS

## 2017-09-30 MED ORDER — MIDAZOLAM HCL 2 MG/2ML IJ SOLN
INTRAMUSCULAR | Status: AC
  Filled 2017-09-30: qty 2

## 2017-09-30 MED ORDER — FENTANYL CITRATE (PF) 100 MCG/2ML IJ SOLN
INTRAMUSCULAR | Status: DC | PRN
  Administered 2017-09-30 (×2): 50 ug via INTRAVENOUS

## 2017-09-30 MED ORDER — DEXAMETHASONE SODIUM PHOSPHATE 10 MG/ML IJ SOLN
INTRAMUSCULAR | Status: AC
  Filled 2017-09-30: qty 1

## 2017-09-30 MED ORDER — PROPOFOL 10 MG/ML IV BOLUS
INTRAVENOUS | Status: AC
  Filled 2017-09-30: qty 20

## 2017-09-30 MED ORDER — ONDANSETRON HCL 4 MG/2ML IJ SOLN
INTRAMUSCULAR | Status: AC
  Filled 2017-09-30: qty 2

## 2017-09-30 MED ORDER — SEVOFLURANE IN SOLN
RESPIRATORY_TRACT | Status: AC
  Filled 2017-09-30: qty 250

## 2017-09-30 MED ORDER — LIDOCAINE HCL (CARDIAC) 20 MG/ML IV SOLN
INTRAVENOUS | Status: DC | PRN
  Administered 2017-09-30: 80 mg via INTRAVENOUS

## 2017-09-30 MED ORDER — OXYCODONE-ACETAMINOPHEN 7.5-325 MG PO TABS: 1.0000 | ORAL_TABLET | ORAL | 0 refills | Status: DC | PRN

## 2017-09-30 MED ORDER — MIDAZOLAM HCL 2 MG/2ML IJ SOLN
INTRAMUSCULAR | Status: DC | PRN
  Administered 2017-09-30: 2 mg via INTRAVENOUS

## 2017-09-30 MED ORDER — OXYCODONE HCL 5 MG PO TABS
ORAL_TABLET | ORAL | Status: AC
  Administered 2017-09-30: 5 mg
  Filled 2017-09-30: qty 1

## 2017-09-30 MED ORDER — ONDANSETRON HCL 4 MG/2ML IJ SOLN
INTRAMUSCULAR | Status: DC | PRN
  Administered 2017-09-30: 4 mg via INTRAVENOUS

## 2017-09-30 MED ORDER — FAMOTIDINE 20 MG PO TABS
ORAL_TABLET | ORAL | Status: AC
  Filled 2017-09-30: qty 1

## 2017-09-30 MED ORDER — FENTANYL CITRATE (PF) 100 MCG/2ML IJ SOLN
INTRAMUSCULAR | Status: AC
  Administered 2017-09-30: 25 ug via INTRAVENOUS
  Filled 2017-09-30: qty 2

## 2017-09-30 MED ORDER — LIDOCAINE HCL (PF) 2 % IJ SOLN
INTRAMUSCULAR | Status: AC
  Filled 2017-09-30: qty 10

## 2017-09-30 MED ORDER — FENTANYL CITRATE (PF) 100 MCG/2ML IJ SOLN
INTRAMUSCULAR | Status: AC
  Filled 2017-09-30: qty 2

## 2017-09-30 MED ORDER — FAMOTIDINE 20 MG PO TABS
20.0000 mg | ORAL_TABLET | Freq: Once | ORAL | Status: AC
  Administered 2017-09-30: 20 mg via ORAL

## 2017-09-30 MED ORDER — PROMETHAZINE HCL 25 MG/ML IJ SOLN: 6.2500 mg | INTRAMUSCULAR | Status: DC | PRN

## 2017-09-30 MED ORDER — PROPOFOL 10 MG/ML IV BOLUS
INTRAVENOUS | Status: DC | PRN
  Administered 2017-09-30: 150 mg via INTRAVENOUS
  Administered 2017-09-30: 50 mg via INTRAVENOUS

## 2017-09-30 MED ORDER — DEXAMETHASONE SODIUM PHOSPHATE 10 MG/ML IJ SOLN
INTRAMUSCULAR | Status: DC | PRN
  Administered 2017-09-30: 10 mg via INTRAVENOUS

## 2017-09-30 MED ORDER — SODIUM CHLORIDE 0.9 % IV SOLN
INTRAVENOUS | Status: DC
  Administered 2017-09-30: 10:00:00 via INTRAVENOUS

## 2017-09-30 SURGICAL SUPPLY — 18 items
BAG INFUSER PRESSURE 100CC (MISCELLANEOUS) ×3 IMPLANT
CANISTER SUCT 3000ML PPV (MISCELLANEOUS) ×3 IMPLANT
CATH ROBINSON RED A/P 16FR (CATHETERS) ×3 IMPLANT
GLOVE BIO SURGEON STRL SZ8 (GLOVE) ×13 IMPLANT
GOWN STRL REUS W/ TWL LRG LVL3 (GOWN DISPOSABLE) ×1 IMPLANT
GOWN STRL REUS W/ TWL XL LVL3 (GOWN DISPOSABLE) ×1 IMPLANT
GOWN STRL REUS W/TWL LRG LVL3 (GOWN DISPOSABLE) ×3
GOWN STRL REUS W/TWL XL LVL3 (GOWN DISPOSABLE) ×3
IV LACTATED RINGERS 1000ML (IV SOLUTION) ×3 IMPLANT
NOVASURE ENDOMETRIAL ABLATION (MISCELLANEOUS) ×1 IMPLANT
NS IRRIG 500ML POUR BTL (IV SOLUTION) ×3 IMPLANT
PACK DNC HYST (MISCELLANEOUS) ×3 IMPLANT
PAD OB MATERNITY 4.3X12.25 (PERSONAL CARE ITEMS) ×3 IMPLANT
PAD PREP 24X41 OB/GYN DISP (PERSONAL CARE ITEMS) ×3 IMPLANT
STRAP SAFETY 5IN WIDE (MISCELLANEOUS) ×3 IMPLANT
TUBING CONNECTING 10 (TUBING) ×2 IMPLANT
TUBING CONNECTING 10' (TUBING) ×1
TUBING HYSTEROSCOPY DOLPHIN (MISCELLANEOUS) ×2 IMPLANT

## 2017-09-30 NOTE — Anesthesia Preprocedure Evaluation (Signed)
Anesthesia Evaluation  Patient identified by MRN, date of birth, ID band Patient awake    Reviewed: Allergy & Precautions, H&P , NPO status , Patient's Chart, lab work & pertinent test results, reviewed documented beta blocker date and time   History of Anesthesia Complications Negative for: history of anesthetic complications  Airway Mallampati: II  TM Distance: >3 FB Neck ROM: full    Dental  (+) Caps, Dental Advidsory Given   Pulmonary neg pulmonary ROS, former smoker,           Cardiovascular Exercise Tolerance: Good hypertension, (-) angina(-) CAD, (-) Past MI, (-) Cardiac Stents and (-) CABG + dysrhythmias (-) Valvular Problems/Murmurs     Neuro/Psych PSYCHIATRIC DISORDERS Anxiety Depression negative neurological ROS     GI/Hepatic negative GI ROS, Neg liver ROS,   Endo/Other  diabetes, Type 2, Oral Hypoglycemic Agents  Renal/GU negative Renal ROS  negative genitourinary   Musculoskeletal   Abdominal   Peds  Hematology  (+) Blood dyscrasia, anemia ,   Anesthesia Other Findings Past Medical History: No date: Anemia 8756: Complication of anesthesia     Comment:  DURING SCAR TISSUE EXCISION FROM FALLOPIAN TUBES(2002),               PT WAS GIVEN GENERAL ANESTHESIA AND PT BEGAN HAVING A               REACTION TO THE ANESTHESIA AND LIPS AND TONGUE STARTED               SWELLING AND THE SURGERY HAD TO BE STOPPED DUE TO AIRWAY               CLOSING UP-PT HAD GA PRIOR TO THIS FOR HER GALLBLADDER               WITH NO PROBLEM PREVIOUSLY No date: Depression No date: Diabetes mellitus without complication (HCC) No date: Dysrhythmia     Comment:  H/O TACHYCARDIA. stress and anxiety brings it on No date: Family history of adverse reaction to anesthesia     Comment:  [place] UP No date: Hypertension No date: Irregular periods No date: Vitamin D deficiency     Comment:  per patient, no longer a  problem   Reproductive/Obstetrics negative OB ROS                             Anesthesia Physical Anesthesia Plan  ASA: II  Anesthesia Plan: General   Post-op Pain Management:    Induction: Intravenous  PONV Risk Score and Plan: 3 and Ondansetron and Dexamethasone  Airway Management Planned: LMA  Additional Equipment:   Intra-op Plan:   Post-operative Plan: Extubation in OR  Informed Consent: I have reviewed the patients History and Physical, chart, labs and discussed the procedure including the risks, benefits and alternatives for the proposed anesthesia with the patient or authorized representative who has indicated his/her understanding and acceptance.   Dental Advisory Given  Plan Discussed with: Anesthesiologist, CRNA and Surgeon  Anesthesia Plan Comments:         Anesthesia Quick Evaluation

## 2017-09-30 NOTE — Interval H&P Note (Signed)
History and Physical Interval Note:  09/30/2017 9:46 AM  Michelle Marshall  has presented today for surgery, with the diagnosis of AUB, S/P ENDOMETRIAL ABLATION, CERVICAL STENOSIS, ABNORMAL PELVIC U/S, SUBMUCOUS LEIOMYOMA OF UTERUS  The various methods of treatment have been discussed with the patient and family. After consideration of risks, benefits and other options for treatment, the patient has consented to  Procedure(s): [place] (N/A) as a surgical intervention .  The patient's history has been reviewed, patient examined, no change in status, stable for surgery.  I have reviewed the patient's chart and labs.  Questions were answered to the patient's satisfaction.     Hassell Done A Defrancesco

## 2017-09-30 NOTE — Transfer of Care (Signed)
Immediate Anesthesia Transfer of Care Note  Patient: Michelle Marshall  Procedure(s) Performed: Procedure(s): DILATATION & CURETTAGE/HYSTEROSCOPY (N/A)  Patient Location: PACU  Anesthesia Type:General  Level of Consciousness: sedated  Airway & Oxygen Therapy: Patient Spontanous Breathing and Patient connected to face mask oxygen  Post-op Assessment: Report given to RN and Post -op Vital signs reviewed and stable  Post vital signs: Reviewed and stable  Last Vitals:  Vitals:   09/30/17 0818 09/30/17 1052  BP: 136/85 (!) 125/94  Pulse: 93 79  Resp: 16 11  Temp: 36.6 C 36.6 C  SpO2: 03% 00%    Complications: No apparent anesthesia complications

## 2017-09-30 NOTE — Anesthesia Procedure Notes (Signed)
Procedure Name: LMA Insertion Date/Time: 09/30/2017 10:07 AM Performed by: Doreen Salvage, CRNA Pre-anesthesia Checklist: Patient identified, Patient being monitored, Timeout performed, Emergency Drugs available and Suction available Patient Re-evaluated:Patient Re-evaluated prior to induction Oxygen Delivery Method: Circle system utilized Preoxygenation: Pre-oxygenation with 100% oxygen Induction Type: IV induction Ventilation: Mask ventilation without difficulty LMA: LMA inserted LMA Size: 3.5 Tube type: Oral Number of attempts: 1 Placement Confirmation: positive ETCO2 and breath sounds checked- equal and bilateral Tube secured with: Tape Dental Injury: Teeth and Oropharynx as per pre-operative assessment

## 2017-09-30 NOTE — Anesthesia Post-op Follow-up Note (Signed)
Anesthesia QCDR form completed.        

## 2017-09-30 NOTE — Discharge Instructions (Signed)

## 2017-09-30 NOTE — Op Note (Signed)
OPERATIVE NOTE  Date of surgery: 09/30/2017  Preoperative diagnosis:  1. AUB 2. Cervical Stenosis 3. S/P Novasure Ablation 4. Uterine Fibroids  Postoperative diagnosis: SAA  Operative procedure: 1. Hysteroscopy 2. Dilation and curettage of endometrium  Surgeon: Dr. Zipporah Plants Assistant:David Amalia Hailey, MD Anesthesia: General LMA   Findings: Pelvis: Gynecoid Uterus: sounded to 8 cm Normal appearing endometrial cavity without gross lesions; there was approximately 30% of the cavity with associated endometrial tissue present, while approximately 70% of the cavity was scarred.  Description of procedure Patient was brought to the operating room where she was placed in supine position. General anesthesia was induced without difficulty using the LMA technique. The patient was placed in dorsal lithotomy position using candy cane stirrups. A Hibiclens perineal intravaginal prep and drape was performed in standard fashion. Weighted speculum was placed in the vagina.  Jacobs tenaculum was placed on the anterior cervix. Uterus was sounded to 8 cm after endocervical canal dilation. Hanks dilators were used up to a #20 [place] caliber to dilate the endocervical canal. The Myosure hysteroscope using lactated Ringer's as irrigant was used for the hysteroscopy. The above-noted findings were photo documented. The hysteroscope removed. The smooth and serrated curettes were then used to curettage the endometrial cavity with production of minimal tissue.  There appeared to be an irregularity at the 4 o'clock position of the endometrial cavity consistent with possible fibroid. Stone polyp forceps were used to grasp residual tissue left behind. Repeat hysteroscopy was performed and demonstrated excellent sampling. Procedure was then terminated with all instrumentation being removed from the vagina. The patient was then awakened mobilized and taken to the recovery room in satisfactory condition.  IV fluids: See  anesthesia flowsheet  urine output: 200 mL EBL: 20 mL  Instruments, needles, and sponge counts were verified as correct.  Brayton Mars, MD 09/30/2017

## 2017-10-01 LAB — SURGICAL PATHOLOGY

## 2017-10-01 NOTE — Anesthesia Postprocedure Evaluation (Signed)
Anesthesia Post Note  Patient: Michelle Marshall  Procedure(s) Performed: DILATATION & CURETTAGE/HYSTEROSCOPY (N/A )  Patient location during evaluation: PACU Anesthesia Type: General Level of consciousness: awake and alert Pain management: pain level controlled Vital Signs Assessment: post-procedure vital signs reviewed and stable Respiratory status: spontaneous breathing, nonlabored ventilation, respiratory function stable and patient connected to nasal cannula oxygen Cardiovascular status: blood pressure returned to baseline and stable Postop Assessment: no apparent nausea or vomiting Anesthetic complications: no     Last Vitals:  Vitals:   09/30/17 1208 09/30/17 1253  BP: 122/84 113/67  Pulse: 60 64  Resp: 14 16  Temp: (!) 36.2 C   SpO2: 94% 94%    Last Pain:  Vitals:   10/01/17 0834  TempSrc:   PainSc: 1                  Martha Clan

## 2017-10-09 ENCOUNTER — Encounter: Payer: Self-pay | Admitting: Obstetrics and Gynecology

## 2017-10-09 ENCOUNTER — Ambulatory Visit (INDEPENDENT_AMBULATORY_CARE_PROVIDER_SITE_OTHER): Payer: 59 | Admitting: Obstetrics and Gynecology

## 2017-10-09 VITALS — BP 108/70 | HR 90 | Ht 69.0 in | Wt 225.5 lb

## 2017-10-09 DIAGNOSIS — Z09 Encounter for follow-up examination after completed treatment for conditions other than malignant neoplasm: Secondary | ICD-10-CM | POA: Insufficient documentation

## 2017-10-09 DIAGNOSIS — R9389 Abnormal findings on diagnostic imaging of other specified body structures: Secondary | ICD-10-CM

## 2017-10-09 DIAGNOSIS — N939 Abnormal uterine and vaginal bleeding, unspecified: Secondary | ICD-10-CM

## 2017-10-09 DIAGNOSIS — N882 Stricture and stenosis of cervix uteri: Secondary | ICD-10-CM

## 2017-10-09 HISTORY — DX: Encounter for follow-up examination after completed treatment for conditions other than malignant neoplasm: Z09

## 2017-10-09 NOTE — Patient Instructions (Signed)
1.  Resume activities as tolerated 2.  Maintain menstrual calendar monitoring for abnormal uterine bleeding 3.  Return in 6 months for follow-up and further management planning

## 2017-10-09 NOTE — Progress Notes (Signed)
Chief complaint: 1.  1 week postop check 2.  Status post hysteroscopy/D&C  Patient presents for follow-up after surgery.  She is status post hysteroscopy/D&C because of abnormal uterine bleeding; history of endometrial ablation and cervical stenosis for which endometrial sampling in the office could not be completed.  Findings from surgery included approximately 30% of uterine cavity showing normal endometrial tissue with the remainder of the cavity being scarred from prior ablation.  Pathology:  Surgical Pathology  CASE: [phone]  PATIENT: Michelle Marshall  Surgical Pathology Report   SPECIMEN SUBMITTED:  A. Endometrial curettings   CLINICAL HISTORY:  None provided   PRE-OPERATIVE DIAGNOSIS:  AUB, S/P endometrial ablation, cervical stenosis, abnormal pelvic U/S,  sub mucous leiomyoma of uterus   POST-OPERATIVE DIAGNOSIS:  Same as pre op   DIAGNOSIS:  A. ENDOMETRIAL CURETTINGS; DILATATION AND CURETTAGE:  - WEAKLY PROLIFERATIVE ENDOMETRIUM WITH TUBAL METAPLASIA.  - BENIGN CERVICAL TISSUE.  - NEGATIVE FOR HYPERPLASIA AND CARCINOMA.   OBJECTIVE: BP 108/70   Pulse 90   Ht 5\' 9"  (1.753 m)   Wt 225 lb 8 oz (102.3 kg)   BMI 33.30 kg/m  Physical exam-deferred  ASSESSMENT: 1.  Normal 1 week postop check status post hysteroscopy/D&C for abnormal uterine bleeding after endometrial ablation 2.  Benign endometrial pathology  PLAN: 1.  Resume activities as tolerated 2.  Options of management were reviewed. 3.  Maintain menstrual calendar monitoring for any abnormal uterine bleeding 4.  Return in 6 months for follow-up  Brayton Mars, MD  Note: This dictation was prepared with Dragon dictation along with smaller phrase technology. Any transcriptional errors that result from this process are unintentional.

## 2017-10-13 ENCOUNTER — Other Ambulatory Visit: Payer: Self-pay | Admitting: Primary Care

## 2017-10-13 DIAGNOSIS — G4700 Insomnia, unspecified: Secondary | ICD-10-CM

## 2017-10-24 ENCOUNTER — Other Ambulatory Visit: Payer: Self-pay | Admitting: Primary Care

## 2017-10-24 DIAGNOSIS — G4700 Insomnia, unspecified: Secondary | ICD-10-CM

## 2017-10-28 ENCOUNTER — Other Ambulatory Visit: Payer: Self-pay | Admitting: Primary Care

## 2017-10-28 DIAGNOSIS — G4700 Insomnia, unspecified: Secondary | ICD-10-CM

## 2017-10-29 ENCOUNTER — Other Ambulatory Visit: Payer: Self-pay | Admitting: Primary Care

## 2017-10-29 DIAGNOSIS — G4700 Insomnia, unspecified: Secondary | ICD-10-CM

## 2017-10-29 NOTE — Telephone Encounter (Signed)
Called in the Trazodone to [facility]. Notified patient that it was called in to the pharmacy as well.

## 2017-10-29 NOTE — Telephone Encounter (Signed)
Patient calling stating the pharmacy told her they did not receive escribed prescription 10/14/17. They have sent multiple requests that we have denied as already being sent. Patient states the pharmacist told her to have [place] phone in RX if escribe isn't working for [place]. They do not have the prescription for trazadone. Pt has been out since last Wednesday  10/23/17. Please advise.  [facility] [place], [place] [facility] [place]  [place] [address]  Phone: [phone] Fax: [phone]

## 2017-12-25 ENCOUNTER — Other Ambulatory Visit: Payer: Self-pay | Admitting: Primary Care

## 2017-12-25 DIAGNOSIS — I10 Essential (primary) hypertension: Secondary | ICD-10-CM

## 2017-12-26 ENCOUNTER — Ambulatory Visit: Payer: 59 | Admitting: Primary Care

## 2017-12-26 DIAGNOSIS — Z0289 Encounter for other administrative examinations: Secondary | ICD-10-CM

## 2018-01-01 ENCOUNTER — Ambulatory Visit: Payer: 59 | Admitting: Primary Care

## 2018-01-17 ENCOUNTER — Ambulatory Visit: Payer: 59 | Admitting: Primary Care

## 2018-01-17 DIAGNOSIS — Z0289 Encounter for other administrative examinations: Secondary | ICD-10-CM

## 2018-02-18 ENCOUNTER — Ambulatory Visit: Payer: 59 | Admitting: Primary Care

## 2018-02-18 ENCOUNTER — Encounter: Payer: Self-pay | Admitting: Primary Care

## 2018-02-18 ENCOUNTER — Other Ambulatory Visit: Payer: Self-pay | Admitting: Primary Care

## 2018-02-18 ENCOUNTER — Telehealth: Payer: Self-pay | Admitting: *Deleted

## 2018-02-18 VITALS — BP 150/100 | HR 100 | Temp 98.3°F | Ht 69.0 in | Wt 210.0 lb

## 2018-02-18 DIAGNOSIS — G4700 Insomnia, unspecified: Secondary | ICD-10-CM | POA: Diagnosis not present

## 2018-02-18 DIAGNOSIS — F411 Generalized anxiety disorder: Secondary | ICD-10-CM

## 2018-02-18 MED ORDER — ESCITALOPRAM OXALATE 20 MG PO TABS: 20.0000 mg | ORAL_TABLET | Freq: Every day | ORAL | 1 refills | Status: DC

## 2018-02-18 MED ORDER — TRAZODONE HCL 100 MG PO TABS: 150.0000 mg | ORAL_TABLET | Freq: Every evening | ORAL | 1 refills | Status: DC | PRN

## 2018-02-18 NOTE — Assessment & Plan Note (Signed)
Grieving the loss of her husband since July 4th, offered my condolences.   Will increase Lexapro to 20 mg, new RX sent to pharmacy. Increase Trazodone to 150 mg, refills sent to pharmacy.  Continue with grief counseling, she will start her support group in September.   Recommended FMLA for short leave from work as she works through the grieving process, will support her for 8 weeks to start.   Follow up in 4 weeks for re-evaluation.

## 2018-02-18 NOTE — Assessment & Plan Note (Signed)
Secondary to anxiety from grief.  Increase Trazodone to 150 mg to start.  Did not do well on Ambien in the past. Consider low dose Clonazepam if needed.  Follow up in 4 weeks.

## 2018-02-18 NOTE — Telephone Encounter (Signed)
Okay to put her in at 4:15 today. Please make sure she comes on time as we are adding an extra time slot just for her.

## 2018-02-18 NOTE — Telephone Encounter (Signed)
Spoken to patient and she decline the offer for the appointment on Wednesday 02/19/2018 at 4pm.   Patient wanted if possible for Michelle Marshall to see her today. Patient stated that she needs something that would help.

## 2018-02-18 NOTE — Progress Notes (Signed)
Subjective:    Patient ID: Michelle Marshall, female    DOB: 05-10-1966, 52 y.o.   MRN: [id number]  HPI  Michelle Marshall is a 52 year old female who presents today to discus anxiety and insomnia.   She lost her husband unexpectedly on July 4th due to brain aneurysm. Since then she's experienced symptoms of anxiety, depression, and insomnia.   She is currently seeing a grief counselor, previously twice weekly, now once weekly. She was encouraged to see her PCP for further management of ongoing symptoms. Symptoms include mind racing thoughts, tearfulness, doesn't care about anything, no concentration, feeling overwhelmed. She can't go back to work, hasn't been back to work since July 4th.    She's been taking Lexapro 10 mg for years, increased her dose to 20 mg yesterday. She's also taking Trazodone 100 mg at bedtime, will fall asleep, and then wake back up three hours later with mind racing thoughts. She was once on Ambien HS but this didn't work.   She will start with a support group in September. She denies SI/HI. She has noticed her BP is higher, but thinks it's secondary to symptoms and lack of sleep.   Review of Systems  Respiratory: Negative for shortness of breath.   Cardiovascular: Negative for chest pain.  Neurological: Negative for headaches.  Psychiatric/Behavioral: Positive for sleep disturbance. Negative for suicidal ideas.       See HPI       Past Medical History:  Diagnosis Date  . Anemia   . Complication of anesthesia 2002   DURING SCAR TISSUE EXCISION FROM FALLOPIAN TUBES(2002), PT WAS GIVEN GENERAL ANESTHESIA AND PT BEGAN HAVING A REACTION TO THE ANESTHESIA AND LIPS AND TONGUE STARTED SWELLING AND THE SURGERY HAD TO BE STOPPED DUE TO AIRWAY CLOSING UP-PT HAD GA PRIOR TO THIS FOR HER GALLBLADDER WITH NO PROBLEM PREVIOUSLY  . Depression   . Diabetes mellitus without complication ([place])   . Dysrhythmia    H/O TACHYCARDIA. stress and anxiety brings it on  . Family  history of adverse reaction to anesthesia    [place] [facility]  . Hypertension   . Irregular periods   . Vitamin D deficiency    per patient, no longer a problem     Social History   Socioeconomic History  . Marital status: Married    Spouse name: Not on file  . Number of children: Not on file  . Years of education: Not on file  . Highest education level: Not on file  Occupational History  . Not on file  Social Needs  . Financial resource strain: Not on file  . Food insecurity:    Worry: Not on file    Inability: Not on file  . Transportation needs:    Medical: Not on file    Non-medical: Not on file  Tobacco Use  . Smoking status: Former Smoker    Packs/day: 0.25    Years: 20.00    Pack years: 5.00    Types: Cigarettes    Last attempt to quit: 09/05/2015    Years since quitting: 2.4  . Smokeless tobacco: Never Used  Substance and Sexual Activity  . Alcohol use: No    Alcohol/week: 0.0 standard drinks    Comment: occasional glass of wine  . Drug use: No  . Sexual activity: Yes    Birth control/protection: None  Lifestyle  . Physical activity:    Days per week: Not on file    Minutes per session:  Not on file  . Stress: Not on file  Relationships  . Social connections:    Talks on phone: Not on file    Gets together: Not on file    Attends religious service: Not on file    Active member of club or organization: Not on file    Attends meetings of clubs or organizations: Not on file    Relationship status: Not on file  . Intimate partner violence:    Fear of current or ex partner: Not on file    Emotionally abused: Not on file    Physically abused: Not on file    Forced sexual activity: Not on file  Other Topics Concern  . Not on file  Social History Narrative   Married.   1 child.   She works as a Designer, industrial/product   Enjoys shopping, traveling.     Past Surgical History:  Procedure Laterality Date  . CHOLECYSTECTOMY    . DILATATION &  CURETTAGE/HYSTEROSCOPY WITH MYOSURE N/A 09/30/2017   Procedure: DILATATION & CURETTAGE/HYSTEROSCOPY;  Surgeon: Defrancesco, Alanda Slim, MD;  Location: [facility] ORS;  Service: Gynecology;  Laterality: N/A;  . DILITATION & CURRETTAGE/HYSTROSCOPY WITH NOVASURE ABLATION N/A 09/19/2015   Procedure: DILATATION & CURETTAGE/HYSTEROSCOPY WITH NOVASURE ABLATION;  Surgeon: Brayton Mars, MD;  Location: [facility] ORS;  Service: Gynecology;  Laterality: N/A;  . JOINT REPLACEMENT Left    30 years ago. knee  . KNEE ARTHROSCOPY Left     Family History  Problem Relation Age of Onset  . Cancer Father        throat  . Hypertension Mother   . Diabetes Paternal Grandmother     Allergies  Allergen Reactions  . Lisinopril Swelling    Lips and face  . Losartan Other (See Comments)    Mouth swollen, rash and itching  . Wellbutrin [Bupropion] Hives and Swelling    Swelling of mouth  . Percocet [Oxycodone-Acetaminophen] Anxiety    Current Outpatient Medications on File Prior to Visit  Medication Sig Dispense Refill  . amLODipine (NORVASC) 10 MG tablet TAKE 1 TABLET(10 MG) BY MOUTH DAILY 90 tablet 1  . aspirin EC 81 MG tablet Take 81 mg by mouth daily.    . carvedilol (COREG) 12.5 MG tablet TAKE 1 TABLET(12.5 MG) BY MOUTH TWICE DAILY WITH A MEAL 180 tablet 2  . EPINEPHrine (EPIPEN 2-PAK) 0.3 mg/0.3 mL IJ SOAJ injection Inject 0.3 mg into the muscle once.    Marland Kitchen glipiZIDE (GLUCOTROL XL) 10 MG 24 hr tablet TAKE 1 TABLET(10 MG) BY MOUTH DAILY WITH BREAKFAST 90 tablet 1  . hydrochlorothiazide (HYDRODIURIL) 25 MG tablet TAKE 1 TABLET BY MOUTH DAILY 90 tablet 0  . ibuprofen (ADVIL,MOTRIN) 800 MG tablet TAKE 1 TABLET(800 MG) BY MOUTH EVERY 8 HOURS AS NEEDED (Patient taking differently: TAKE 1 TABLET(800 MG) BY MOUTH EVERY 8 HOURS AS NEEDED FOR PAIN OR HEADACHE) 30 tablet 0  . JENTADUETO 2.11-998 MG TABS TAKE 1 TABLET BY MOUTH TWICE DAILY 180 tablet 1   No current facility-administered medications on file prior to visit.      BP (!) 150/100   Pulse 100   Temp 98.3 F (36.8 C) (Oral)   Ht 5\' 9"  (1.753 m)   Wt 210 lb (95.3 kg)   SpO2 97%   BMI 31.01 kg/m    Objective:   Physical Exam  Constitutional: She appears well-nourished.  Neck: Neck supple.  Cardiovascular: Normal rate and regular rhythm.  Respiratory: Effort normal and breath sounds normal.  Skin: Skin is warm and dry.  Psychiatric: She has a normal mood and affect.  Tearful at times during HPI           Assessment & Plan:

## 2018-02-18 NOTE — Telephone Encounter (Signed)
Spoken and notified patient of Michelle Marshall comments. Patient verbalized understanding.  OV at 4:20 pm today 02/18/2018

## 2018-02-18 NOTE — Telephone Encounter (Signed)
Copied from [place] [id number]. Topic: Appointment Scheduling - Scheduling Inquiry for Clinic >> Feb 18, 2018 12:08 PM Berneta Levins wrote: Reason for CRM:  Pt calling requesting appointment for anxiety/not sleeping well - spouse just died.  Pt sees Alma Friendly, she would like to be seen today - states that grief counselor requested she get in ASAP. Pt can be reached at [phone].

## 2018-02-18 NOTE — Patient Instructions (Signed)
We've increased the dose of your Trazodone to 150 mg. Take 1 and 1/2 tablet by mouth at bedtime for sleep.  We've increased the dose of your Lexapro to 20 mg.   Please consider FMLA leave given your circumstances.   Follow up with your counselor as scheduled.   Please schedule a follow up appointment in 1 month for re-evaluation.  It was a pleasure to see you today!

## 2018-04-14 ENCOUNTER — Encounter: Payer: 59 | Admitting: Obstetrics and Gynecology

## 2018-04-15 ENCOUNTER — Encounter: Payer: 59 | Admitting: Obstetrics and Gynecology

## 2018-04-22 ENCOUNTER — Ambulatory Visit: Payer: 59 | Admitting: Obstetrics and Gynecology

## 2018-04-22 ENCOUNTER — Encounter: Payer: Self-pay | Admitting: Obstetrics and Gynecology

## 2018-04-22 VITALS — BP 112/69 | HR 109 | Ht 69.0 in | Wt 189.8 lb

## 2018-04-22 DIAGNOSIS — F4321 Adjustment disorder with depressed mood: Secondary | ICD-10-CM

## 2018-04-22 DIAGNOSIS — Z9889 Other specified postprocedural states: Secondary | ICD-10-CM

## 2018-04-22 DIAGNOSIS — N939 Abnormal uterine and vaginal bleeding, unspecified: Secondary | ICD-10-CM | POA: Diagnosis not present

## 2018-04-22 DIAGNOSIS — N951 Menopausal and female climacteric states: Secondary | ICD-10-CM | POA: Diagnosis not present

## 2018-04-22 DIAGNOSIS — F419 Anxiety disorder, unspecified: Secondary | ICD-10-CM

## 2018-04-22 MED ORDER — SERTRALINE HCL 50 MG PO TABS: 50.0000 mg | ORAL_TABLET | Freq: Every day | ORAL | 6 refills | Status: DC

## 2018-04-22 MED ORDER — ESTRADIOL-PROGESTERONE 1-100 MG PO CAPS: 100.0000 mg | ORAL_CAPSULE | Freq: Every day | ORAL | 12 refills | Status: DC

## 2018-04-22 NOTE — Patient Instructions (Signed)
1.  FSH is drawn today 2.  Begin Bijuva 1/100 milligrams daily 3.  Begin Zoloft 50 mg a day.  Take 1/2 tablet daily for the first 7 days, then 1 tablet daily thereafter 4.  Return in 4 weeks for annual exam  Sertraline tablets What is this medicine? SERTRALINE (SER tra leen) is used to treat depression. It may also be used to treat obsessive compulsive disorder, panic disorder, post-trauma stress, premenstrual dysphoric disorder (PMDD) or social anxiety. This medicine may be used for other purposes; ask your health care provider or pharmacist if you have questions. COMMON BRAND NAME(S): Zoloft What should I tell my health care provider before I take this medicine? They need to know if you have any of these conditions: -bleeding disorders -bipolar disorder or a family history of bipolar disorder -glaucoma -heart disease -high blood pressure -history of irregular heartbeat -history of low levels of calcium, magnesium, or potassium in the blood -if you often drink alcohol -liver disease -receiving electroconvulsive therapy -seizures -suicidal thoughts, plans, or attempt; a previous suicide attempt by you or a family member -take medicines that treat or prevent blood clots -thyroid disease -an unusual or allergic reaction to sertraline, other medicines, foods, dyes, or preservatives -pregnant or trying to get pregnant -breast-feeding How should I use this medicine? Take this medicine by mouth with a glass of water. Follow the directions on the prescription label. You can take it with or without food. Take your medicine at regular intervals. Do not take your medicine more often than directed. Do not stop taking this medicine suddenly except upon the advice of your doctor. Stopping this medicine too quickly may cause serious side effects or your condition may worsen. A special MedGuide will be given to you by the pharmacist with each prescription and refill. Be sure to read this information  carefully each time. Talk to your pediatrician regarding the use of this medicine in children. While this drug may be prescribed for children as young as 7 years for selected conditions, precautions do apply. Overdosage: If you think you have taken too much of this medicine contact a poison control center or emergency room at once. NOTE: This medicine is only for you. Do not share this medicine with others. What if I miss a dose? If you miss a dose, take it as soon as you can. If it is almost time for your next dose, take only that dose. Do not take double or extra doses. What may interact with this medicine? Do not take this medicine with any of the following medications: -cisapride -dofetilide -dronedarone -linezolid -MAOIs like Carbex, Eldepryl, Marplan, Nardil, and Parnate -methylene blue (injected into a vein) -pimozide -thioridazine This medicine may also interact with the following medications: -alcohol -amphetamines -aspirin and aspirin-like medicines -certain medicines for depression, anxiety, or psychotic disturbances -certain medicines for fungal infections like ketoconazole, fluconazole, posaconazole, and itraconazole -certain medicines for irregular heart beat like flecainide, quinidine, propafenone -certain medicines for migraine headaches like almotriptan, eletriptan, frovatriptan, naratriptan, rizatriptan, sumatriptan, zolmitriptan -certain medicines for sleep -certain medicines for seizures like carbamazepine, valproic acid, phenytoin -certain medicines that treat or prevent blood clots like warfarin, enoxaparin, dalteparin -cimetidine -digoxin -diuretics -fentanyl -isoniazid -lithium -NSAIDs, medicines for pain and inflammation, like ibuprofen or naproxen -other medicines that prolong the QT interval (cause an abnormal heart rhythm) -rasagiline -safinamide -supplements like St. John's wort, kava kava, valerian -tolbutamide -tramadol -tryptophan This list may  not describe all possible interactions. Give your health care provider a list  of all the medicines, herbs, non-prescription drugs, or dietary supplements you use. Also tell them if you smoke, drink alcohol, or use illegal drugs. Some items may interact with your medicine. What should I watch for while using this medicine? Tell your doctor if your symptoms do not get better or if they get worse. Visit your doctor or health care professional for regular checks on your progress. Because it may take several weeks to see the full effects of this medicine, it is important to continue your treatment as prescribed by your doctor. Patients and their families should watch out for new or worsening thoughts of suicide or depression. Also watch out for sudden changes in feelings such as feeling anxious, agitated, panicky, irritable, hostile, aggressive, impulsive, severely restless, overly excited and hyperactive, or not being able to sleep. If this happens, especially at the beginning of treatment or after a change in dose, call your health care professional. Dennis Bast may get drowsy or dizzy. Do not drive, use machinery, or do anything that needs mental alertness until you know how this medicine affects you. Do not stand or sit up quickly, especially if you are an older patient. This reduces the risk of dizzy or fainting spells. Alcohol may interfere with the effect of this medicine. Avoid alcoholic drinks. Your mouth may get dry. Chewing sugarless gum or sucking hard candy, and drinking plenty of water may help. Contact your doctor if the problem does not go away or is severe. What side effects may I notice from receiving this medicine? Side effects that you should report to your doctor or health care professional as soon as possible: -allergic reactions like skin rash, itching or hives, swelling of the face, lips, or tongue -anxious -black, tarry stools -changes in vision -confusion -elevated mood, decreased need for  sleep, racing thoughts, impulsive behavior -eye pain -fast, irregular heartbeat -feeling faint or lightheaded, falls -feeling agitated, angry, or irritable -hallucination, loss of contact with reality -loss of balance or coordination -loss of memory -painful or prolonged erections -restlessness, pacing, inability to keep still -seizures -stiff muscles -suicidal thoughts or other mood changes -trouble sleeping -unusual bleeding or bruising -unusually weak or tired -vomiting Side effects that usually do not require medical attention (report to your doctor or health care professional if they continue or are bothersome): -change in appetite or weight -change in sex drive or performance -diarrhea -increased sweating -indigestion, nausea -tremors This list may not describe all possible side effects. Call your doctor for medical advice about side effects. You may report side effects to FDA at 1-800-FDA-1088. Where should I keep my medicine? Keep out of the reach of children. Store at room temperature between 15 and 30 degrees C (59 and 86 degrees F). Throw away any unused medicine after the expiration date. NOTE: This sheet is a summary. It may not cover all possible information. If you have questions about this medicine, talk to your doctor, pharmacist, or health care provider.  2018 Elsevier/Gold Standard (2016-06-29 14:17:49)

## 2018-04-23 DIAGNOSIS — F4321 Adjustment disorder with depressed mood: Secondary | ICD-10-CM | POA: Insufficient documentation

## 2018-04-23 DIAGNOSIS — F419 Anxiety disorder, unspecified: Secondary | ICD-10-CM | POA: Insufficient documentation

## 2018-04-23 HISTORY — DX: Adjustment disorder with depressed mood: F43.21

## 2018-04-23 LAB — FOLLICLE STIMULATING HORMONE: FSH: 3.5 m[IU]/mL

## 2018-04-23 NOTE — Progress Notes (Signed)
Chief complaint: 1.  History of abnormal uterine bleeding 2.  Status post hysteroscopy/D&C 3.  History of endometrial ablation 4.  Anxiety 5.  Grief  Michelle Marshall is a 52 year old recently widowed female (husband deceased from brain aneurysm in August 2019), who presents for follow-up on abnormal uterine bleeding.  She underwent a hysteroscopy/D&C in September 30, 2017 for abnormal uterine bleeding.  Finding at that time was revealed significant synechiae within the uterine cavity with about 30% of normal-appearing tissue with endometrium.  Pathology from surgery was benign.  Since surgery patient has maintain menstrual calendar monitoring and has had cyclic minimal spotting on a monthly basis with 1 to several days of duration.  She is not expressing any pelvic pain.  Michelle Marshall is experiencing vasomotor symptoms especially at night with night sweats and hot flashes.  She is interested in possible management of this symptomatology with medication.  Michelle Marshall is now grieving the loss of her husband from August 2019.  She now is a single mom at age 35 with an 88 year old daughter.  She has been obtaining counseling through psychology as well as through spiritual counselors.  She does feel that there is a good support group for her in the community.  Patient is taking trazodone to help with sleep at night.  However, she states that her psychology counselors have stated that she may benefit from additional anxiolytic medication.  Michelle Marshall is not suicidal.  ASSESSMENT: 1.  Status post hysteroscopy D&C for abnormal uterine bleeding with continued minimal cyclic bleeding 2.  History of endometrial ablation 3.  History of recent loss of spouse to brain aneurysm, now with appropriate grief reaction 4.  Vasomotor symptoms 5.  Anxiety/grief  PLAN: 1.  FSH 2.  Trial of Michelle Marshall 1/100 4 HRT management of vasomotor symptoms 3.  Begin Zoloft 50 mg daily, titrating up from 25 mg daily for the first week of  therapy 4.  Continue monitoring abnormal uterine bleeding with menstrual calendar 5.  Continue with grief counseling with psychology and spiritual advisors 6.  Return in 4 weeks for annual exam and further management of vasomotor symptoms and anxiety.  A total of 25 minutes were spent face-to-face with the patient during this encounter and over half of that time involved counseling and coordination of care.  Brayton Mars, MD  Note: This dictation was prepared with Dragon dictation along with smaller phrase technology. Any transcriptional errors that result from this process are unintentional.

## 2018-05-15 ENCOUNTER — Other Ambulatory Visit (HOSPITAL_COMMUNITY)
Admission: RE | Admit: 2018-05-15 | Discharge: 2018-05-15 | Disposition: A | Discharge status: Home / Self Care | Payer: 59 | Source: Ambulatory Visit | Attending: Obstetrics and Gynecology | Admitting: Obstetrics and Gynecology

## 2018-05-15 ENCOUNTER — Encounter: Payer: Self-pay | Admitting: Obstetrics and Gynecology

## 2018-05-15 ENCOUNTER — Ambulatory Visit (INDEPENDENT_AMBULATORY_CARE_PROVIDER_SITE_OTHER): Payer: 59 | Admitting: Obstetrics and Gynecology

## 2018-05-15 VITALS — BP 155/93 | HR 94 | Ht 69.0 in | Wt 190.2 lb

## 2018-05-15 DIAGNOSIS — Z1211 Encounter for screening for malignant neoplasm of colon: Secondary | ICD-10-CM

## 2018-05-15 DIAGNOSIS — F4321 Adjustment disorder with depressed mood: Secondary | ICD-10-CM

## 2018-05-15 DIAGNOSIS — F5102 Adjustment insomnia: Secondary | ICD-10-CM

## 2018-05-15 DIAGNOSIS — F419 Anxiety disorder, unspecified: Secondary | ICD-10-CM

## 2018-05-15 DIAGNOSIS — Z01419 Encounter for gynecological examination (general) (routine) without abnormal findings: Secondary | ICD-10-CM

## 2018-05-15 DIAGNOSIS — Z1239 Encounter for other screening for malignant neoplasm of breast: Secondary | ICD-10-CM

## 2018-05-15 MED ORDER — SERTRALINE HCL 100 MG PO TABS: 100.0000 mg | ORAL_TABLET | Freq: Every day | ORAL | 1 refills | Status: DC

## 2018-05-15 NOTE — Patient Instructions (Addendum)
1.  Pap smear is done. 2.  Mammogram is ordered. 3.  Screening labs are obtained through primary care. 4.  Colonoscopy is to be scheduled. 5.  Continue with healthy eating and exercise. 6.  Recommend calcium 600 mg twice a day vitamin D 400 international units twice a day. 7.  Continue with Zoloft 100 mg a day; continue with counseling with her pastor and hospice. 8.  Continue with Bijuva daily 9.  Return in 1 year for annual exam-patient is to schedule her appointment just prior to Thanksgiving.   Health Maintenance for Postmenopausal Women Menopause is a normal process in which your reproductive ability comes to an end. This process happens gradually over a span of months to years, usually between the ages of 24 and 39. Menopause is complete when you have missed 12 consecutive menstrual periods. It is important to talk with your health care provider about some of the most common conditions that affect postmenopausal women, such as heart disease, cancer, and bone loss (osteoporosis). Adopting a healthy lifestyle and getting preventive care can help to promote your health and wellness. Those actions can also lower your chances of developing some of these common conditions. What should I know about menopause? During menopause, you may experience a number of symptoms, such as:  Moderate-to-severe hot flashes.  Night sweats.  Decrease in sex drive.  Mood swings.  Headaches.  Tiredness.  Irritability.  Memory problems.  Insomnia.  Choosing to treat or not to treat menopausal changes is an individual decision that you make with your health care provider. What should I know about hormone replacement therapy and supplements? Hormone therapy products are effective for treating symptoms that are associated with menopause, such as hot flashes and night sweats. Hormone replacement carries certain risks, especially as you become older. If you are thinking about using estrogen or estrogen  with progestin treatments, discuss the benefits and risks with your health care provider. What should I know about heart disease and stroke? Heart disease, heart attack, and stroke become more likely as you age. This may be due, in part, to the hormonal changes that your body experiences during menopause. These can affect how your body processes dietary fats, triglycerides, and cholesterol. Heart attack and stroke are both medical emergencies. There are many things that you can do to help prevent heart disease and stroke:  Have your blood pressure checked at least every 1-2 years. High blood pressure causes heart disease and increases the risk of stroke.  If you are 1-80 years old, ask your health care provider if you should take aspirin to prevent a heart attack or a stroke.  Do not use any tobacco products, including cigarettes, chewing tobacco, or electronic cigarettes. If you need help quitting, ask your health care provider.  It is important to eat a healthy diet and maintain a healthy weight. ? Be sure to include plenty of vegetables, fruits, low-fat dairy products, and lean protein. ? Avoid eating foods that are high in solid fats, added sugars, or salt (sodium).  Get regular exercise. This is one of the most important things that you can do for your health. ? Try to exercise for at least 150 minutes each week. The type of exercise that you do should increase your heart rate and make you sweat. This is known as moderate-intensity exercise. ? Try to do strengthening exercises at least twice each week. Do these in addition to the moderate-intensity exercise.  Know your numbers.Ask your health care provider to  check your cholesterol and your blood glucose. Continue to have your blood tested as directed by your health care provider.  What should I know about cancer screening? There are several types of cancer. Take the following steps to reduce your risk and to catch any cancer development  as early as possible. Breast Cancer  Practice breast self-awareness. ? This means understanding how your breasts normally appear and feel. ? It also means doing regular breast self-exams. Let your health care provider know about any changes, no matter how small.  If you are 36 or older, have a clinician do a breast exam (clinical breast exam or CBE) every year. Depending on your age, family history, and medical history, it may be recommended that you also have a yearly breast X-ray (mammogram).  If you have a family history of breast cancer, talk with your health care provider about genetic screening.  If you are at high risk for breast cancer, talk with your health care provider about having an MRI and a mammogram every year.  Breast cancer (BRCA) gene test is recommended for women who have family members with BRCA-related cancers. Results of the assessment will determine the need for genetic counseling and BRCA1 and for BRCA2 testing. BRCA-related cancers include these types: ? Breast. This occurs in males or females. ? Ovarian. ? Tubal. This may also be called fallopian tube cancer. ? Cancer of the abdominal or pelvic lining (peritoneal cancer). ? Prostate. ? Pancreatic.  Cervical, Uterine, and Ovarian Cancer Your health care provider may recommend that you be screened regularly for cancer of the pelvic organs. These include your ovaries, uterus, and vagina. This screening involves a pelvic exam, which includes checking for microscopic changes to the surface of your cervix (Pap test).  For women ages 21-65, health care providers may recommend a pelvic exam and a Pap test every three years. For women ages 55-65, they may recommend the Pap test and pelvic exam, combined with testing for human papilloma virus (HPV), every five years. Some types of HPV increase your risk of cervical cancer. Testing for HPV may also be done on women of any age who have unclear Pap test results.  Other health  care providers may not recommend any screening for nonpregnant women who are considered low risk for pelvic cancer and have no symptoms. Ask your health care provider if a screening pelvic exam is right for you.  If you have had past treatment for cervical cancer or a condition that could lead to cancer, you need Pap tests and screening for cancer for at least 20 years after your treatment. If Pap tests have been discontinued for you, your risk factors (such as having a new sexual partner) need to be reassessed to determine if you should start having screenings again. Some women have medical problems that increase the chance of getting cervical cancer. In these cases, your health care provider may recommend that you have screening and Pap tests more often.  If you have a family history of uterine cancer or ovarian cancer, talk with your health care provider about genetic screening.  If you have vaginal bleeding after reaching menopause, tell your health care provider.  There are currently no reliable tests available to screen for ovarian cancer.  Lung Cancer Lung cancer screening is recommended for adults 55-55 years old who are at high risk for lung cancer because of a history of smoking. A yearly low-dose CT scan of the lungs is recommended if you:  Currently smoke.  Have a history of at least 30 pack-years of smoking and you currently smoke or have quit within the past 15 years. A pack-year is smoking an average of one pack of cigarettes per day for one year.  Yearly screening should:  Continue until it has been 15 years since you quit.  Stop if you develop a health problem that would prevent you from having lung cancer treatment.  Colorectal Cancer  This type of cancer can be detected and can often be prevented.  Routine colorectal cancer screening usually begins at age 47 and continues through age 42.  If you have risk factors for colon cancer, your health care provider may  recommend that you be screened at an earlier age.  If you have a family history of colorectal cancer, talk with your health care provider about genetic screening.  Your health care provider may also recommend using home test kits to check for hidden blood in your stool.  A small camera at the end of a tube can be used to examine your colon directly (sigmoidoscopy or colonoscopy). This is done to check for the earliest forms of colorectal cancer.  Direct examination of the colon should be repeated every 5-10 years until age 38. However, if early forms of precancerous polyps or small growths are found or if you have a family history or genetic risk for colorectal cancer, you may need to be screened more often.  Skin Cancer  Check your skin from head to toe regularly.  Monitor any moles. Be sure to tell your health care provider: ? About any new moles or changes in moles, especially if there is a change in a mole's shape or color. ? If you have a mole that is larger than the size of a pencil eraser.  If any of your family members has a history of skin cancer, especially at a young age, talk with your health care provider about genetic screening.  Always use sunscreen. Apply sunscreen liberally and repeatedly throughout the day.  Whenever you are outside, protect yourself by wearing long sleeves, pants, a wide-brimmed hat, and sunglasses.  What should I know about osteoporosis? Osteoporosis is a condition in which bone destruction happens more quickly than new bone creation. After menopause, you may be at an increased risk for osteoporosis. To help prevent osteoporosis or the bone fractures that can happen because of osteoporosis, the following is recommended:  If you are 59-18 years old, get at least 1,000 mg of calcium and at least 600 mg of vitamin D per day.  If you are older than age 17 but younger than age 30, get at least 1,200 mg of calcium and at least 600 mg of vitamin D per  day.  If you are older than age 20, get at least 1,200 mg of calcium and at least 800 mg of vitamin D per day.  Smoking and excessive alcohol intake increase the risk of osteoporosis. Eat foods that are rich in calcium and vitamin D, and do weight-bearing exercises several times each week as directed by your health care provider. What should I know about how menopause affects my mental health? Depression may occur at any age, but it is more common as you become older. Common symptoms of depression include:  Low or sad mood.  Changes in sleep patterns.  Changes in appetite or eating patterns.  Feeling an overall lack of motivation or enjoyment of activities that you previously enjoyed.  Frequent crying spells.  Talk with your  health care provider if you think that you are experiencing depression. What should I know about immunizations? It is important that you get and maintain your immunizations. These include:  Tetanus, diphtheria, and pertussis (Tdap) booster vaccine.  Influenza every year before the flu season begins.  Pneumonia vaccine.  Shingles vaccine.  Your health care provider may also recommend other immunizations. This information is not intended to replace advice given to you by your health care provider. Make sure you discuss any questions you have with your health care provider. Document Released: 08/17/2005 Document Revised: 01/13/2016 Document Reviewed: 03/29/2015 Elsevier Interactive Patient Education  2018 [employer].

## 2018-05-15 NOTE — Progress Notes (Signed)
ANNUAL PREVENTATIVE CARE GYN  ENCOUNTER NOTE  Subjective:       Michelle Marshall is a 52 y.o. G5P0020 female here for a routine annual gynecologic exam.  Current complaints:  1.  zoloft 100 qd- still wakes up feeling anxious; patient increased her Zoloft from 50 to 100 mg on her own.  She has been taking this medication for approximately 2 weeks and still feels somewhat anxious in the mornings.  She is not having any headaches or stomach cramps.  2.  Grief-patient and her daughter are going to grief counseling due to the recent loss of spouse.  She does have excellent support systems in place including her church and hospice.  She is taking trazodone at night to help with insomnia.  She is currently on the increased dosage of Zoloft 100 mg daily.  No physiologic health changes have been noted other than anxiety and grief which have been noted. Bowel function is normal.  Bladder function is normal.  Patient states that since starting HRT, her vasomotor symptoms have diminished.  Gynecologic history:  No LMP recorded. Patient has had an ablation. Contraception: abstinence Last Pap: 2015 . Results were: normal Last mammogram: 2017 Results were: normal  Obstetric History OB History  Gravida Para Term Preterm AB Living  2       2    SAB TAB Ectopic Multiple Live Births  2            # Outcome Date GA Lbr Len/2nd Weight Sex Delivery Anes PTL Lv  2 SAB           1 SAB             Past Medical History:  Diagnosis Date  . Anemia   . Complication of anesthesia 2002   DURING SCAR TISSUE EXCISION FROM FALLOPIAN TUBES(2002), PT WAS GIVEN GENERAL ANESTHESIA AND PT BEGAN HAVING A REACTION TO THE ANESTHESIA AND LIPS AND TONGUE STARTED SWELLING AND THE SURGERY HAD TO BE STOPPED DUE TO AIRWAY CLOSING UP-PT HAD GA PRIOR TO THIS FOR HER GALLBLADDER WITH NO PROBLEM PREVIOUSLY  . Depression   . Diabetes mellitus without complication ([place])   . Dysrhythmia    H/O TACHYCARDIA. stress and anxiety  brings it on  . Family history of adverse reaction to anesthesia    [place] UP  . Hypertension   . Irregular periods   . Vitamin D deficiency    per patient, no longer a problem    Past Surgical History:  Procedure Laterality Date  . CHOLECYSTECTOMY    . DILATATION & CURETTAGE/HYSTEROSCOPY WITH MYOSURE N/A 09/30/2017   Procedure: DILATATION & CURETTAGE/HYSTEROSCOPY;  Surgeon: Jalacia Mattila, Alanda Slim, MD;  Location: [facility];  Service: Gynecology;  Laterality: N/A;  . DILITATION & CURRETTAGE/HYSTROSCOPY WITH NOVASURE ABLATION N/A 09/19/2015   Procedure: DILATATION & CURETTAGE/HYSTEROSCOPY WITH NOVASURE ABLATION;  Surgeon: Brayton Mars, MD;  Location: [facility] OR[facility];  Service: Gynecology;  Laterality: N/A;  . JOINT REPLACEMENT Left    30 years ago. knee  . KNEE ARTHROSCOPY Left     Current Outpatient Medications on File Prior to Visit  Medication Sig Dispense Refill  . amLODipine (NORVASC) 10 MG tablet TAKE 1 TABLET(10 MG) BY MOUTH DAILY 90 tablet 1  . aspirin EC 81 MG tablet Take 81 mg by mouth daily.    . carvedilol (COREG) 12.5 MG tablet TAKE 1 TABLET(12.5 MG) BY MOUTH TWICE DAILY WITH A MEAL 180 tablet 2  . EPINEPHrine (EPIPEN 2-PAK) 0.3 mg/0.3 mL  IJ SOAJ injection Inject 0.3 mg into the muscle once.    . Estradiol-Progesterone (BIJUVA) 1-100 MG CAPS Take 100 mg by mouth daily. 30 capsule 12  . glipiZIDE (GLUCOTROL XL) 10 MG 24 hr tablet TAKE 1 TABLET(10 MG) BY MOUTH DAILY WITH BREAKFAST 90 tablet 1  . hydrochlorothiazide (HYDRODIURIL) 25 MG tablet TAKE 1 TABLET BY MOUTH DAILY 90 tablet 0  . ibuprofen (ADVIL,MOTRIN) 800 MG tablet TAKE 1 TABLET(800 MG) BY MOUTH EVERY 8 HOURS AS NEEDED (Patient taking differently: TAKE 1 TABLET(800 MG) BY MOUTH EVERY 8 HOURS AS NEEDED FOR PAIN OR HEADACHE) 30 tablet 0  . JENTADUETO 2.11-998 MG TABS TAKE 1 TABLET BY MOUTH TWICE DAILY 180 tablet 1  . sertraline (ZOLOFT) 100 MG tablet Take 100 mg by mouth daily.    . traZODone (DESYREL)  100 MG tablet Take 1.5 tablets (150 mg total) by mouth at bedtime as needed. for sleep 135 tablet 1   No current facility-administered medications on file prior to visit.     Allergies  Allergen Reactions  . Lisinopril Swelling    Lips and face  . Losartan Other (See Comments)    Mouth swollen, rash and itching  . Wellbutrin [Bupropion] Hives and Swelling    Swelling of mouth  . Percocet [Oxycodone-Acetaminophen] Anxiety    Social History   Socioeconomic History  . Marital status: Married    Spouse name: Not on file  . Number of children: Not on file  . Years of education: Not on file  . Highest education level: Not on file  Occupational History  . Not on file  Social Needs  . Financial resource strain: Not on file  . Food insecurity:    Worry: Not on file    Inability: Not on file  . Transportation needs:    Medical: Not on file    Non-medical: Not on file  Tobacco Use  . Smoking status: Former Smoker    Packs/day: 0.25    Years: 20.00    Pack years: 5.00    Types: Cigarettes    Last attempt to quit: 09/05/2015    Years since quitting: 2.6  . Smokeless tobacco: Never Used  Substance and Sexual Activity  . Alcohol use: No    Alcohol/week: 0.0 standard drinks    Comment: occasional glass of wine  . Drug use: No  . Sexual activity: Not Currently    Birth control/protection: None  Lifestyle  . Physical activity:    Days per week: 0 days    Minutes per session: 0 min  . Stress: Not on file  Relationships  . Social connections:    Talks on phone: Not on file    Gets together: Not on file    Attends religious service: Not on file    Active member of club or organization: Not on file    Attends meetings of clubs or organizations: Not on file    Relationship status: Not on file  . Intimate partner violence:    Fear of current or ex partner: Not on file    Emotionally abused: Not on file    Physically abused: Not on file    Forced sexual activity: Not on file   Other Topics Concern  . Not on file  Social History Narrative   Married.   1 child.   She works as a Designer, industrial/product   Enjoys shopping, traveling.     Family History  Problem Relation Age of Onset  . Cancer Father  throat  . Hypertension Mother   . Diabetes Paternal Grandmother   . Breast cancer Neg Hx     The following portions of the patient's history were reviewed and updated as appropriate: allergies, current medications, past family history, past medical history, past social history, past surgical history and problem list.  Review of Systems Review of Systems  Constitutional: Negative.   Respiratory: Negative.   Cardiovascular: Negative.   Gastrointestinal: Negative.   Genitourinary: Negative.   Skin: Negative.   Psychiatric/Behavioral: The patient is nervous/anxious and has insomnia.   All other systems reviewed and are negative.     Objective:   BP (!) 155/93   Pulse 94   Ht 5\' 9"  (1.753 m)   Wt 190 lb 3.2 oz (86.3 kg)   BMI 28.09 kg/m  CONSTITUTIONAL: Well-developed, well-nourished female in no acute distress.  PSYCHIATRIC: Normal mood and affect. Normal behavior. Normal judgment and thought content. [place]: Alert and oriented to person, place, and time. Normal muscle tone coordination. No cranial nerve deficit noted. HENT:  Normocephalic, atraumatic, External right and left ear normal.  EYES: Conjunctivae and EOM are normal. No scleral icterus.  NECK: Normal range of motion, supple, no masses.  Normal thyroid.  SKIN: Skin is warm and dry. No rash noted. Not diaphoretic. No erythema. No pallor. CARDIOVASCULAR: Normal heart rate noted, regular rhythm, no murmur. RESPIRATORY: Clear to auscultation bilaterally. Effort and breath sounds normal, no problems with respiration noted. BREASTS: Symmetric in size. No masses, skin changes, nipple drainage, or lymphadenopathy. ABDOMEN: Soft, no distention noted.  No tenderness, rebound or guarding.  BLADDER:  Normal PELVIC:  External Genitalia: Normal  BUS: Normal  Vagina: Normal estrogen effect; excellent vault support  Cervix: No lesions  Uterus: Midplane, normal size and shape, mobile, nontender  Adnexa: Nonpalpable and nontender  RV: External Exam NormaI internal exam deferred due to upcoming colonoscopy MUSCULOSKELETAL: Normal range of motion. No tenderness.  No cyanosis, clubbing, or edema.  2+ distal pulses. LYMPHATIC: No Axillary, Supraclavicular, or Inguinal Adenopathy.    Assessment:   Annual gynecologic examination 52 y.o. Contraception: abstinence bmi-28 Menopause, asymptomatic with HRT use Grief from recent loss of spouse, ongoing Insomnia, controlled with trazodone  Plan:  Pap: Pap Co Test Mammogram: Ordered Stool Guaiac Testing:  Colonoscopy is scheduled. Labs: thru pcp Routine preventative health maintenance measures emphasized: Exercise/Diet/Weight control, Tobacco Warnings, Alcohol/Substance use risks and Stress Management  Continue Zoloft 100 mg daily Continue with grief counseling Continue Bijuva daily Return to Clinic - 1 Year-patient to schedule appointment at [facility] [place]  am acting as a Education administrator for [employer].  I have reviewed, updated, and concur with information scribed by Joyice Faster, CMA Brayton Mars, MD  Note: This dictation was prepared with Dragon dictation along with smaller phrase technology. Any transcriptional errors that result from this process are unintentional.

## 2018-05-20 LAB — CYTOLOGY - PAP
Diagnosis: NEGATIVE
HPV: NOT DETECTED

## 2018-05-21 ENCOUNTER — Other Ambulatory Visit: Payer: Self-pay

## 2018-08-15 ENCOUNTER — Other Ambulatory Visit: Payer: Self-pay | Admitting: Primary Care

## 2018-08-15 DIAGNOSIS — G4700 Insomnia, unspecified: Secondary | ICD-10-CM

## 2018-10-10 ENCOUNTER — Ambulatory Visit (INDEPENDENT_AMBULATORY_CARE_PROVIDER_SITE_OTHER): Payer: Self-pay | Admitting: Primary Care

## 2018-10-10 ENCOUNTER — Other Ambulatory Visit: Payer: Self-pay

## 2018-10-10 DIAGNOSIS — E119 Type 2 diabetes mellitus without complications: Secondary | ICD-10-CM

## 2018-10-10 DIAGNOSIS — F4321 Adjustment disorder with depressed mood: Secondary | ICD-10-CM

## 2018-10-10 DIAGNOSIS — F411 Generalized anxiety disorder: Secondary | ICD-10-CM

## 2018-10-10 DIAGNOSIS — I10 Essential (primary) hypertension: Secondary | ICD-10-CM

## 2018-10-10 DIAGNOSIS — J302 Other seasonal allergic rhinitis: Secondary | ICD-10-CM | POA: Insufficient documentation

## 2018-10-10 MED ORDER — OLOPATADINE HCL 0.1 % OP SOLN: 1.0000 [drp] | Freq: Two times a day (BID) | OPHTHALMIC | 0 refills | Status: DC

## 2018-10-10 NOTE — Assessment & Plan Note (Signed)
Symptoms representative of seasonal allergies. Will have her start Zyrtec nightly. Rx for patanol sent to pharmacy. She will update.

## 2018-10-10 NOTE — Assessment & Plan Note (Signed)
Endorses normal reading in December 2019 per GYN, we do not have that documented. We will have her BP checked next week through a nurse visit.   Continue current medications for now. BMP pending.

## 2018-10-10 NOTE — Patient Instructions (Signed)
Start Zyrtec once every night for allergies.  You can use the olopatadine eye drops twice daily as needed for eye symptoms.  I will be in touch once we receive your lab results.  Keep up the great work with weight loss, congratulations!  It was nice to see you! Allie Bossier, NP-C

## 2018-10-10 NOTE — Assessment & Plan Note (Signed)
Off of meds for 2 months per patient due to weight loss. Commended her on weight loss efforts. Repeat A1C and lipids pending. Continue off of medications for now.

## 2018-10-10 NOTE — Progress Notes (Signed)
Subjective:    Patient ID: Michelle Marshall, female    DOB: 06-May-1966, 53 y.o.   MRN: [id number]  HPI  Virtual Visit via Video Note  I connected with Michelle Marshall on 10/10/18 at  3:20 PM EDT by a video enabled telemedicine application and verified that I am speaking with the correct person using two identifiers.   I discussed the limitations of evaluation and management by telemedicine and the availability of in person appointments. The patient expressed understanding and agreed to proceed. She is at home, I am in the office.  We started WebEx video but had to discontinue due to video failure.   History of Present Illness:  Michelle Marshall is a 53 year old female who presents today via video for follow up.  1) Type 2 Diabetes:   Current medications include: Jentadueto 2.11-998 mg BID, Glipizide ER 10 mg daily. She hasn't taken her diabetes medications as she's lost approximately 50 pounds through healthy diet. She stopped her medications 2 months ago.  She is checking her blood glucose 1 times daily and is getting readings of: AM fasting: 120's  Last A1C: 8.4 in December 2018 Last Eye Exam: Completed in 2019 Last Foot Exam: Next visit Pneumonia Vaccination: Completed in 2018 ACE/ARB: None. Urine microalbumin pending Statin: None. Lipid panel pending.   Wt Readings from Last 3 Encounters:  05/15/18 190 lb 3.2 oz (86.3 kg)  04/22/18 189 lb 12.8 oz (86.1 kg)  02/18/18 210 lb (95.3 kg)     2) GAD/Grief: Currently managed on Zoloft 100 mg per GYN, previously managed on Lexapro which was increased to 20 mg in September 2019 due to grief from the loss of her husband unexpectedly in July 2019. Based off of office notes per GYN it appears that she was initiated on Zoloft 50 mg in October 2019, no mention of previously taking Lexapro. Zoloft was increased to 100 mg shortly after.  Today she endorses that she didn't find any improvement with Lexapro, is doing well overall on  Zoloft 100 mg daily. She denies SI/HI. She has noticed breakthrough anxiety given the recent Covid-19 outbreak.   3) Essential Hypertension:  Currently managed on HCTZ 25 mg, carvedilol 12.5 mg BID, Amlodipine 10 mg. She has not checked her blood pressure at home recently. She endorses a normal reading at her GYN's office in December which was 110/72.   She denies dizziness, chest pain, visual changes.   BP Readings from Last 3 Encounters:  05/15/18 (!) 155/93  04/22/18 112/69  02/18/18 (!) 150/100   4) Eye Irritation: Puffy, watery, itchy eyes daily, also waking with her eyes matted with a clear/whitish color. Symptoms began 2 months ago. She's tried allergy eye drops and eye lid wipes without improvement. She denies sneezing, rhinorrhea, cough.    Observations/Objective:  Alert and oriented. No distress. Appears well.   Assessment and Plan:  See problem based charting.  Follow Up Instructions:  Start Zyrtec once every night for allergies.  You can use the olopatadine eye drops twice daily as needed for eye symptoms.  I will be in touch once we receive your lab results.  Keep up the great work with weight loss, congratulations!  It was nice to see you! Michelle Marshall, Michelle Marshall    I discussed the assessment and treatment plan with the patient. The patient was provided an opportunity to ask questions and all were answered. The patient agreed with the plan and demonstrated an understanding of the instructions.   The patient  was advised to call back or seek an in-person evaluation if the symptoms worsen or if the condition fails to improve as anticipated.  I provided 10 minutes of non-face-to-face time during this encounter due to video failure with WebEx.   Michelle Marshall, Michelle Marshall    Review of Systems  Constitutional: Negative for fever.  HENT: Negative for postnasal drip, rhinorrhea, sinus pressure and sneezing.   Eyes: Positive for redness and itching.       Watery eyes   Respiratory: Negative for cough.   Endocrine: Negative for polydipsia, polyphagia and polyuria.  Neurological: Negative for dizziness, numbness and headaches.  Psychiatric/Behavioral:       See HPI       Past Medical History:  Diagnosis Date  . Anemia   . Complication of anesthesia 2002   DURING SCAR TISSUE EXCISION FROM FALLOPIAN TUBES(2002), PT WAS GIVEN GENERAL ANESTHESIA AND PT BEGAN HAVING A REACTION TO THE ANESTHESIA AND LIPS AND TONGUE STARTED SWELLING AND THE SURGERY HAD TO BE STOPPED DUE TO AIRWAY CLOSING UP-PT HAD GA PRIOR TO THIS FOR HER GALLBLADDER WITH NO PROBLEM PREVIOUSLY  . Depression   . Diabetes mellitus without complication ([place])   . Dysrhythmia    H/O TACHYCARDIA. stress and anxiety brings it on  . Family history of adverse reaction to anesthesia    [place] UP  . Hypertension   . Irregular periods   . Vitamin D deficiency    per patient, no longer a problem     Social History   Socioeconomic History  . Marital status: Married    Spouse name: Not on file  . Number of children: Not on file  . Years of education: Not on file  . Highest education level: Not on file  Occupational History  . Not on file  Social Needs  . Financial resource strain: Not on file  . Food insecurity:    Worry: Not on file    Inability: Not on file  . Transportation needs:    Medical: Not on file    Non-medical: Not on file  Tobacco Use  . Smoking status: Former Smoker    Packs/day: 0.25    Years: 20.00    Pack years: 5.00    Types: Cigarettes    Last attempt to quit: 09/05/2015    Years since quitting: 3.0  . Smokeless tobacco: Never Used  Substance and Sexual Activity  . Alcohol use: No    Alcohol/week: 0.0 standard drinks    Comment: occasional glass of wine  . Drug use: No  . Sexual activity: Not Currently    Birth control/protection: None  Lifestyle  . Physical activity:    Days per week: 0 days    Minutes per session: 0 min  . Stress: Not on  file  Relationships  . Social connections:    Talks on phone: Not on file    Gets together: Not on file    Attends religious service: Not on file    Active member of club or organization: Not on file    Attends meetings of clubs or organizations: Not on file    Relationship status: Not on file  . Intimate partner violence:    Fear of current or ex partner: Not on file    Emotionally abused: Not on file    Physically abused: Not on file    Forced sexual activity: Not on file  Other Topics Concern  . Not on file  Social History Narrative  Married.   1 child.   She works as a Designer, industrial/product   Enjoys shopping, traveling.     Past Surgical History:  Procedure Laterality Date  . CHOLECYSTECTOMY    . DILATATION & CURETTAGE/HYSTEROSCOPY WITH MYOSURE N/A 09/30/2017   Procedure: DILATATION & CURETTAGE/HYSTEROSCOPY;  Surgeon: Defrancesco, Alanda Slim, MD;  Location: [facility] ORS;  Service: Gynecology;  Laterality: N/A;  . DILITATION & CURRETTAGE/HYSTROSCOPY WITH NOVASURE ABLATION N/A 09/19/2015   Procedure: DILATATION & CURETTAGE/HYSTEROSCOPY WITH NOVASURE ABLATION;  Surgeon: Brayton Mars, MD;  Location: [facility] ORS;  Service: Gynecology;  Laterality: N/A;  . JOINT REPLACEMENT Left    30 years ago. knee  . KNEE ARTHROSCOPY Left     Family History  Problem Relation Age of Onset  . Cancer Father        throat  . Hypertension Mother   . Diabetes Paternal Grandmother   . Breast cancer Neg Hx     Allergies  Allergen Reactions  . Lisinopril Swelling    Lips and face  . Losartan Other (See Comments)    Mouth swollen, rash and itching  . Wellbutrin [Bupropion] Hives and Swelling    Swelling of mouth  . Percocet [Oxycodone-Acetaminophen] Anxiety    Current Outpatient Medications on File Prior to Visit  Medication Sig Dispense Refill  . amLODipine (NORVASC) 10 MG tablet TAKE 1 TABLET(10 MG) BY MOUTH DAILY 90 tablet 1  . aspirin EC 81 MG tablet Take 81 mg by mouth daily.    .  carvedilol (COREG) 12.5 MG tablet TAKE 1 TABLET(12.5 MG) BY MOUTH TWICE DAILY WITH A MEAL 180 tablet 2  . EPINEPHrine (EPIPEN 2-PAK) 0.3 mg/0.3 mL IJ SOAJ injection Inject 0.3 mg into the muscle once.    . Estradiol-Progesterone (BIJUVA) 1-100 MG CAPS Take 100 mg by mouth daily. 30 capsule 12  . glipiZIDE (GLUCOTROL XL) 10 MG 24 hr tablet TAKE 1 TABLET(10 MG) BY MOUTH DAILY WITH BREAKFAST 90 tablet 1  . hydrochlorothiazide (HYDRODIURIL) 25 MG tablet TAKE 1 TABLET BY MOUTH DAILY 90 tablet 0  . ibuprofen (ADVIL,MOTRIN) 800 MG tablet TAKE 1 TABLET(800 MG) BY MOUTH EVERY 8 HOURS AS NEEDED (Patient taking differently: TAKE 1 TABLET(800 MG) BY MOUTH EVERY 8 HOURS AS NEEDED FOR PAIN OR HEADACHE) 30 tablet 0  . JENTADUETO 2.11-998 MG TABS TAKE 1 TABLET BY MOUTH TWICE DAILY 180 tablet 1  . sertraline (ZOLOFT) 100 MG tablet Take 100 mg by mouth daily.    . sertraline (ZOLOFT) 100 MG tablet Take 1 tablet (100 mg total) by mouth daily. 90 tablet 1  . traZODone (DESYREL) 100 MG tablet TAKE 1 AND 1/2 TABLETS(150 MG) BY MOUTH AT BEDTIME AS NEEDED FOR SLEEP 135 tablet 1   No current facility-administered medications on file prior to visit.     There were no vitals taken for this visit.   Objective:   Physical Exam  Constitutional: She is oriented to person, place, and time.  Respiratory: Effort normal.  Neurological: She is alert and oriented to person, place, and time.  Psychiatric: She has a normal mood and affect.           Assessment & Plan:

## 2018-10-10 NOTE — Assessment & Plan Note (Signed)
Following with GYN and is now on Zoloft 100 mg, continue same as she is doing well. Denies SI/Hi.

## 2018-10-10 NOTE — Assessment & Plan Note (Signed)
Improved, overall doing better with Zoloft 100 mg, continue same.

## 2018-10-14 ENCOUNTER — Telehealth: Payer: Self-pay | Admitting: Primary Care

## 2018-10-14 NOTE — Telephone Encounter (Signed)
Patient returning call best # [phone] (Preferred)

## 2018-10-14 NOTE — Telephone Encounter (Signed)
Pt is complaining that she mowed her yard today and now she can't move her left knee. Pt has appt tomorrow for nurse visit and labs. Can pt sched 2 appt in one day or can they be combined. Please advise

## 2018-10-15 ENCOUNTER — Ambulatory Visit: Payer: Self-pay

## 2018-10-15 ENCOUNTER — Other Ambulatory Visit (INDEPENDENT_AMBULATORY_CARE_PROVIDER_SITE_OTHER): Payer: Self-pay

## 2018-10-15 ENCOUNTER — Other Ambulatory Visit: Payer: Self-pay

## 2018-10-15 ENCOUNTER — Ambulatory Visit (INDEPENDENT_AMBULATORY_CARE_PROVIDER_SITE_OTHER): Payer: Self-pay | Admitting: Primary Care

## 2018-10-15 VITALS — BP 144/92

## 2018-10-15 DIAGNOSIS — I10 Essential (primary) hypertension: Secondary | ICD-10-CM

## 2018-10-15 DIAGNOSIS — E119 Type 2 diabetes mellitus without complications: Secondary | ICD-10-CM

## 2018-10-15 DIAGNOSIS — M25562 Pain in left knee: Secondary | ICD-10-CM | POA: Insufficient documentation

## 2018-10-15 HISTORY — DX: Pain in left knee: M25.562

## 2018-10-15 LAB — COMPREHENSIVE METABOLIC PANEL
ALT: 24 U/L (ref 0–35)
AST: 23 U/L (ref 0–37)
Albumin: 4.2 g/dL (ref 3.5–5.2)
Alkaline Phosphatase: 46 U/L (ref 39–117)
BUN: 13 mg/dL (ref 6–23)
CO2: 25 mEq/L (ref 19–32)
Calcium: 9.1 mg/dL (ref 8.4–10.5)
Chloride: 103 mEq/L (ref 96–112)
Creatinine, Ser: 0.72 mg/dL (ref 0.40–1.20)
GFR: 103.08 mL/min (ref >60.00)
Glucose, Bld: 201 mg/dL — ABNORMAL HIGH (ref 70–99)
Potassium: 4 mEq/L (ref 3.5–5.1)
Sodium: 137 mEq/L (ref 135–145)
Total Bilirubin: 0.7 mg/dL (ref 0.2–1.2)
Total Protein: 7 g/dL (ref 6.0–8.3)

## 2018-10-15 LAB — LIPID PANEL
Cholesterol: 137 mg/dL (ref 0–200)
HDL: 71.7 mg/dL (ref >39.00)
LDL Cholesterol: 54 mg/dL (ref 0–99)
NonHDL: 65.02
Total CHOL/HDL Ratio: 2
Triglycerides: 55 mg/dL (ref 0.0–149.0)
VLDL: 11 mg/dL (ref 0.0–40.0)

## 2018-10-15 LAB — HEMOGLOBIN A1C: Hgb A1c MFr Bld: 8.7 % — ABNORMAL HIGH (ref 4.6–6.5)

## 2018-10-15 MED ORDER — NAPROXEN 500 MG PO TABS: 500.0000 mg | ORAL_TABLET | Freq: Two times a day (BID) | ORAL | 0 refills | Status: DC | PRN

## 2018-10-15 NOTE — Progress Notes (Signed)
Subjective:    Patient ID: Michelle Marshall, female    DOB: 31-Dec-1965, 53 y.o.   MRN: [id number]  HPI  Virtual Visit via Video Note  I connected with Michelle Marshall on 10/15/18 at 11:40 AM EDT by a video enabled telemedicine application and verified that I am speaking with the correct person using two identifiers.   I discussed the limitations of evaluation and management by telemedicine and the availability of in person appointments. The patient expressed understanding and agreed to proceed. She is at work, I am in the office.  Video worked initially but then failed.   History of Present Illness:  Michelle Marshall is a 53 year old female with a history of total knee replacement during her high school days who presents today via video with a chief complaint of knee pain.  Her pain is located to the left mid anterior knee. She push mowed her yard two days ago and the next day noticed swelling and pain. She's applied icy hot, Bengay, and icing with some improvement. She also took Ibuprofen 800 mg every 4-5 hours for the last 24 hours with some improvement.   She denies numbness/tingling, weakness.    Observations/Objective:  Alert and oriented. Speaking in complete sentences. Appears well.  Video failed before we could see her knee, she will send a picture via my chart.  Assessment and Plan:  Suspect bursitis to be causing symptoms of swelling. Likely from recent increased activity in the heat outdoors. Discussed to continue icing knee, elevating knee, using Icy Hot PRN. Discussed to stop frequent use of Ibuprofen. Rx for naproxen course sent ot pharmacy. Discussed to use Tylenol in between naproxen doses as needed for breakthrough pain. Discussed to purchase a knee sleeve for support during work day and when active. She will update and follow up PRN.   Follow Up Instructions:  Stop Ibuprofen. Start naproxen 500 mg tablets. Take 1 tablet by mouth twice daily with food.   Elevate and ice your knee as discussed.  Consider purchasing a knee sleeve for support during your work day and when active.   Monitor your blood pressure once you've resumed your medications. Your blood pressure should be less than 135/90.  It was nice to see you! Allie Bossier, NP-C    I discussed the assessment and treatment plan with the patient. The patient was provided an opportunity to ask questions and all were answered. The patient agreed with the plan and demonstrated an understanding of the instructions.   The patient was advised to call back or seek an in-person evaluation if the symptoms worsen or if the condition fails to improve as anticipated.     Pleas Koch, NP     Review of Systems  Constitutional: Positive for fever.  Musculoskeletal: Positive for arthralgias and joint swelling.  Skin: Negative for color change.  Neurological: Negative for weakness and numbness.       Past Medical History:  Diagnosis Date  . Anemia   . Complication of anesthesia 2002   DURING SCAR TISSUE EXCISION FROM FALLOPIAN TUBES(2002), PT WAS GIVEN GENERAL ANESTHESIA AND PT BEGAN HAVING A REACTION TO THE ANESTHESIA AND LIPS AND TONGUE STARTED SWELLING AND THE SURGERY HAD TO BE STOPPED DUE TO AIRWAY CLOSING UP-PT HAD GA PRIOR TO THIS FOR HER GALLBLADDER WITH NO PROBLEM PREVIOUSLY  . Depression   . Diabetes mellitus without complication ([place])   . Dysrhythmia    H/O TACHYCARDIA. stress and anxiety brings it on  . Family  history of adverse reaction to anesthesia    BROTHER-HARD TIME WAKING UP  . Hypertension   . Irregular periods   . Vitamin D deficiency    per patient, no longer a problem     Social History   Socioeconomic History  . Marital status: Married    Spouse name: Not on file  . Number of children: Not on file  . Years of education: Not on file  . Highest education level: Not on file  Occupational History  . Not on file  Social Needs  . Financial resource  strain: Not on file  . Food insecurity:    Worry: Not on file    Inability: Not on file  . Transportation needs:    Medical: Not on file    Non-medical: Not on file  Tobacco Use  . Smoking status: Former Smoker    Packs/day: 0.25    Years: 20.00    Pack years: 5.00    Types: Cigarettes    Last attempt to quit: 09/05/2015    Years since quitting: 3.1  . Smokeless tobacco: Never Used  Substance and Sexual Activity  . Alcohol use: No    Alcohol/week: 0.0 standard drinks    Comment: occasional glass of wine  . Drug use: No  . Sexual activity: Not Currently    Birth control/protection: None  Lifestyle  . Physical activity:    Days per week: 0 days    Minutes per session: 0 min  . Stress: Not on file  Relationships  . Social connections:    Talks on phone: Not on file    Gets together: Not on file    Attends religious service: Not on file    Active member of club or organization: Not on file    Attends meetings of clubs or organizations: Not on file    Relationship status: Not on file  . Intimate partner violence:    Fear of current or ex partner: Not on file    Emotionally abused: Not on file    Physically abused: Not on file    Forced sexual activity: Not on file  Other Topics Concern  . Not on file  Social History Narrative   Married.   1 child.   She works as a Designer, industrial/product   Enjoys shopping, traveling.     Past Surgical History:  Procedure Laterality Date  . CHOLECYSTECTOMY    . DILATATION & CURETTAGE/HYSTEROSCOPY WITH MYOSURE N/A 09/30/2017   Procedure: DILATATION & CURETTAGE/HYSTEROSCOPY;  Surgeon: Defrancesco, Alanda Slim, MD;  Location: [facility];  Service: Gynecology;  Laterality: N/A;  . DILITATION & CURRETTAGE/HYSTROSCOPY WITH NOVASURE ABLATION N/A 09/19/2015   Procedure: DILATATION & CURETTAGE/HYSTEROSCOPY WITH NOVASURE ABLATION;  Surgeon: Brayton Mars, MD;  Location: [facility];  Service: Gynecology;  Laterality: N/A;  . JOINT REPLACEMENT Left    30  years ago. knee  . KNEE ARTHROSCOPY Left     Family History  Problem Relation Age of Onset  . Cancer Father        throat  . Hypertension Mother   . Diabetes Paternal Grandmother   . Breast cancer Neg Hx     Allergies  Allergen Reactions  . Lisinopril Swelling    Lips and face  . Losartan Other (See Comments)    Mouth swollen, rash and itching  . Wellbutrin [Bupropion] Hives and Swelling    Swelling of mouth  . Percocet [Oxycodone-Acetaminophen] Anxiety    Current Outpatient Medications on File Prior to  Visit  Medication Sig Dispense Refill  . amLODipine (NORVASC) 10 MG tablet TAKE 1 TABLET(10 MG) BY MOUTH DAILY 90 tablet 1  . aspirin EC 81 MG tablet Take 81 mg by mouth daily.    . carvedilol (COREG) 12.5 MG tablet TAKE 1 TABLET(12.5 MG) BY MOUTH TWICE DAILY WITH A MEAL 180 tablet 2  . EPINEPHrine (EPIPEN 2-PAK) 0.3 mg/0.3 mL IJ SOAJ injection Inject 0.3 mg into the muscle once.    . Estradiol-Progesterone (BIJUVA) 1-100 MG CAPS Take 100 mg by mouth daily. 30 capsule 12  . hydrochlorothiazide (HYDRODIURIL) 25 MG tablet TAKE 1 TABLET BY MOUTH DAILY 90 tablet 0  . ibuprofen (ADVIL,MOTRIN) 800 MG tablet TAKE 1 TABLET(800 MG) BY MOUTH EVERY 8 HOURS AS NEEDED (Patient taking differently: TAKE 1 TABLET(800 MG) BY MOUTH EVERY 8 HOURS AS NEEDED FOR PAIN OR HEADACHE) 30 tablet 0  . olopatadine (PATANOL) 0.1 % ophthalmic solution Place 1 drop into both eyes 2 (two) times daily. 5 mL 0  . sertraline (ZOLOFT) 100 MG tablet Take 1 tablet (100 mg total) by mouth daily. 90 tablet 1  . traZODone (DESYREL) 100 MG tablet TAKE 1 AND 1/2 TABLETS(150 MG) BY MOUTH AT BEDTIME AS NEEDED FOR SLEEP 135 tablet 1   No current facility-administered medications on file prior to visit.     BP (!) 144/92    Objective:   Physical Exam  Constitutional: She is oriented to person, place, and time. She appears well-nourished.  Respiratory: Effort normal.  Musculoskeletal:     Comments: Unable to assess  knee during visit due to video failure. Patient will send picture via my chart.  Neurological: She is alert and oriented to person, place, and time.  Psychiatric: She has a normal mood and affect.           Assessment & Plan:

## 2018-10-15 NOTE — Patient Instructions (Signed)
Stop Ibuprofen. Start naproxen 500 mg tablets. Take 1 tablet by mouth twice daily with food.  Elevate and ice your knee as discussed.  Consider purchasing a knee sleeve for support during your work day and when active.   Monitor your blood pressure once you've resumed your medications. Your blood pressure should be less than 135/90.  It was nice to see you! Allie Bossier, NP-C

## 2018-10-15 NOTE — Assessment & Plan Note (Signed)
No BP meds taken for the last 4-5 days. BP check today above goal. Will have her resume medications, monitor BP, report readings at or above 135/90.

## 2018-10-15 NOTE — Telephone Encounter (Signed)
Spoken to patient this morning. She stated that she would a [facility] instead of coming in for the knee pain. She is on schedule today.

## 2018-10-15 NOTE — Assessment & Plan Note (Signed)
Suspect bursitis to be causing symptoms of swelling. Likely from recent increased activity in the heat outdoors. Discussed to continue icing knee, elevating knee, using Icy Hot PRN. Discussed to stop frequent use of Ibuprofen. Rx for naproxen course sent ot pharmacy. Discussed to use Tylenol in between naproxen doses as needed for breakthrough pain. Discussed to purchase a knee sleeve for support during work day and when active. She will update and follow up PRN.

## 2018-10-29 ENCOUNTER — Other Ambulatory Visit: Payer: Self-pay | Admitting: Primary Care

## 2018-10-29 DIAGNOSIS — M25562 Pain in left knee: Secondary | ICD-10-CM

## 2018-12-30 ENCOUNTER — Telehealth: Payer: Self-pay | Admitting: Surgical

## 2018-12-30 NOTE — Telephone Encounter (Signed)
LM for patient to return call to see who she is going to be seeing to have her Zoloft refilled.

## 2018-12-31 DIAGNOSIS — F411 Generalized anxiety disorder: Secondary | ICD-10-CM

## 2019-01-01 MED ORDER — SERTRALINE HCL 100 MG PO TABS: 100.0000 mg | ORAL_TABLET | Freq: Every day | ORAL | 1 refills | Status: DC

## 2019-02-12 ENCOUNTER — Other Ambulatory Visit: Payer: Self-pay | Admitting: Primary Care

## 2019-02-12 DIAGNOSIS — G4700 Insomnia, unspecified: Secondary | ICD-10-CM

## 2019-02-24 ENCOUNTER — Encounter: Payer: Self-pay | Admitting: Primary Care

## 2019-02-24 ENCOUNTER — Ambulatory Visit (INDEPENDENT_AMBULATORY_CARE_PROVIDER_SITE_OTHER): Payer: Self-pay | Admitting: Primary Care

## 2019-02-24 VITALS — Wt 190.0 lb

## 2019-02-24 DIAGNOSIS — L709 Acne, unspecified: Secondary | ICD-10-CM

## 2019-02-24 DIAGNOSIS — F411 Generalized anxiety disorder: Secondary | ICD-10-CM

## 2019-02-24 DIAGNOSIS — F4321 Adjustment disorder with depressed mood: Secondary | ICD-10-CM

## 2019-02-24 MED ORDER — CLINDAMYCIN PHOS-BENZOYL PEROX 1-5 % EX GEL: Freq: Two times a day (BID) | CUTANEOUS | 0 refills | Status: DC

## 2019-02-24 MED ORDER — SERTRALINE HCL 25 MG PO TABS: 25.0000 mg | ORAL_TABLET | Freq: Every day | ORAL | 0 refills | Status: DC

## 2019-02-24 NOTE — Assessment & Plan Note (Signed)
Increased symptoms recently, overall current regimen does seem to be helping.  Suspect symptoms are due to anniversary of her husband's passing coupled with COVID-19 adjustments. Discussed options for treatment including therapy and dose increase in her sertraline.  She opts for both.  We discussed risks of serotonin syndrome with increased dose of sertraline plus trazodone for which she takes nightly.  She verbalized understanding.  Increase sertraline to 125 mg.  Continue trazodone at 150 mg. Referral placed to therapy for further evaluation and treatment.  She will update via my chart in 3 to 4 weeks.

## 2019-02-24 NOTE — Progress Notes (Signed)
Subjective:    Patient ID: Michelle Marshall, female    DOB: April 05, 1966, 53 y.o.   MRN: [id number]  HPI  Virtual Visit via Video Note  I connected with Michelle Marshall on 02/24/19 at  2:00 PM EDT by a video enabled telemedicine application and verified that I am speaking with the correct person using two identifiers.  Location: Patient: Work Provider: Office   I discussed the limitations of evaluation and management by telemedicine and the availability of in person appointments. The patient expressed understanding and agreed to proceed.  History of Present Illness:  Michelle Marshall is a 53 year old female with a history of diabetes, hypertension, anxiety disorder, grief from loss of husband who presents today to discuss anxiety.  She is currently managed on sertraline 100 mg for anxiety and trazodone 150 mg HS for sleep.  Overall she feels well managed on both medications except for over the last 1 month given increased anxiety symptoms.  Symptoms include feeling overwhelmed, increased anxiety, stress with work and her daughter having to participate in virtual learning, difficulty focusing, wanting to sleep all of the time. She's been trying to deal with these symptoms herself but hasn't been successful.  Her husband's birthday is tomorrow, the anniversary of his death was last month.  She is also experiencing some facial irritation from wearing masks at work due to COVID-19.  Her face has broken out with acne appearing spots.  She has tried numerous over-the-counter treatments without improvement.   Observations/Objective:  Alert and oriented. Appears well, not sickly. No distress. Speaking in complete sentences.   Assessment and Plan:  Increased symptoms recently, overall current regimen does seem to be helping.  Suspect symptoms are due to anniversary of her husband's passing coupled with COVID-19 adjustments. Discussed options for treatment including therapy and dose  increase in her sertraline.  She opts for both.  We discussed risks of serotonin syndrome with increased dose of sertraline plus trazodone for which she takes nightly.  She verbalized understanding.  Increase sertraline to 125 mg.  Continue trazodone at 150 mg. Referral placed to therapy for further evaluation and treatment.  We will treat face breakout rash with topical BenzaClin.  She will update.  She will update via my chart in 3 to 4 weeks.  Follow Up Instructions:  We have increased your dose of Zoloft to 125 mg.  You will receive a separate 25 mg tablet to take along with your 100 mg tablet.  Continue trazodone as needed for sleep.  You will be contacted regarding your referral to therapy.  Please let us know if you have not been contacted within one week.   Please update me via my chart in 3 to 4 weeks as discussed.  It was a pleasure to see you today! Allie Bossier, NP-C    I discussed the assessment and treatment plan with the patient. The patient was provided an opportunity to ask questions and all were answered. The patient agreed with the plan and demonstrated an understanding of the instructions.   The patient was advised to call back or seek an in-person evaluation if the symptoms worsen or if the condition fails to improve as anticipated.     Pleas Koch, NP    Review of Systems  Respiratory: Negative for shortness of breath.   Cardiovascular: Negative for chest pain.  Skin:       Facial acne from wearing mask  Psychiatric/Behavioral: The patient is nervous/anxious.  See HPI       Past Medical History:  Diagnosis Date  . Anemia   . Complication of anesthesia 2002   DURING SCAR TISSUE EXCISION FROM FALLOPIAN TUBES(2002), PT WAS GIVEN GENERAL ANESTHESIA AND PT BEGAN HAVING A REACTION TO THE ANESTHESIA AND LIPS AND TONGUE STARTED SWELLING AND THE SURGERY HAD TO BE STOPPED DUE TO AIRWAY CLOSING UP-PT HAD GA PRIOR TO THIS FOR HER GALLBLADDER WITH  NO PROBLEM PREVIOUSLY  . Depression   . Diabetes mellitus without complication ([place])   . Dysrhythmia    H/O TACHYCARDIA. stress and anxiety brings it on  . Family history of adverse reaction to anesthesia    [place] [facility]  . Hypertension   . Irregular periods   . Vitamin D deficiency    per patient, no longer a problem     Social History   Socioeconomic History  . Marital status: Married    Spouse name: Not on file  . Number of children: Not on file  . Years of education: Not on file  . Highest education level: Not on file  Occupational History  . Not on file  Social Needs  . Financial resource strain: Not on file  . Food insecurity    Worry: Not on file    Inability: Not on file  . Transportation needs    Medical: Not on file    Non-medical: Not on file  Tobacco Use  . Smoking status: Former Smoker    Packs/day: 0.25    Years: 20.00    Pack years: 5.00    Types: Cigarettes    Quit date: 09/05/2015    Years since quitting: 3.4  . Smokeless tobacco: Never Used  Substance and Sexual Activity  . Alcohol use: No    Alcohol/week: 0.0 standard drinks    Comment: occasional glass of wine  . Drug use: No  . Sexual activity: Not Currently    Birth control/protection: None  Lifestyle  . Physical activity    Days per week: 0 days    Minutes per session: 0 min  . Stress: Not on file  Relationships  . Social Herbalist on phone: Not on file    Gets together: Not on file    Attends religious service: Not on file    Active member of club or organization: Not on file    Attends meetings of clubs or organizations: Not on file    Relationship status: Not on file  . Intimate partner violence    Fear of current or ex partner: Not on file    Emotionally abused: Not on file    Physically abused: Not on file    Forced sexual activity: Not on file  Other Topics Concern  . Not on file  Social History Narrative   Married.   1 child.   She works as a  Designer, industrial/product   Enjoys shopping, traveling.     Past Surgical History:  Procedure Laterality Date  . CHOLECYSTECTOMY    . DILATATION & CURETTAGE/HYSTEROSCOPY WITH MYOSURE N/A 09/30/2017   Procedure: DILATATION & CURETTAGE/HYSTEROSCOPY;  Surgeon: Defrancesco, Alanda Slim, MD;  Location: [facility];  Service: Gynecology;  Laterality: N/A;  . DILITATION & CURRETTAGE/HYSTROSCOPY WITH NOVASURE ABLATION N/A 09/19/2015   Procedure: DILATATION & CURETTAGE/HYSTEROSCOPY WITH NOVASURE ABLATION;  Surgeon: Brayton Mars, MD;  Location: [facility];  Service: Gynecology;  Laterality: N/A;  . JOINT REPLACEMENT Left    30 years ago. knee  .  KNEE ARTHROSCOPY Left     Family History  Problem Relation Age of Onset  . Cancer Father        throat  . Hypertension Mother   . Diabetes Paternal Grandmother   . Breast cancer Neg Hx     Allergies  Allergen Reactions  . Lisinopril Swelling    Lips and face  . Losartan Other (See Comments)    Mouth swollen, rash and itching  . Wellbutrin [Bupropion] Hives and Swelling    Swelling of mouth  . Percocet [Oxycodone-Acetaminophen] Anxiety    Current Outpatient Medications on File Prior to Visit  Medication Sig Dispense Refill  . amLODipine (NORVASC) 10 MG tablet TAKE 1 TABLET(10 MG) BY MOUTH DAILY 90 tablet 1  . aspirin EC 81 MG tablet Take 81 mg by mouth daily.    . carvedilol (COREG) 12.5 MG tablet TAKE 1 TABLET(12.5 MG) BY MOUTH TWICE DAILY WITH A MEAL 180 tablet 2  . Estradiol-Progesterone (BIJUVA) 1-100 MG CAPS Take 100 mg by mouth daily. 30 capsule 12  . hydrochlorothiazide (HYDRODIURIL) 25 MG tablet TAKE 1 TABLET BY MOUTH DAILY 90 tablet 0  . olopatadine (PATANOL) 0.1 % ophthalmic solution Place 1 drop into both eyes 2 (two) times daily. 5 mL 0  . sertraline (ZOLOFT) 100 MG tablet Take 1 tablet (100 mg total) by mouth daily. 90 tablet 1  . traZODone (DESYREL) 100 MG tablet TAKE 1 AND 1/2 TABLETS(150 MG) BY MOUTH AT BEDTIME AS NEEDED FOR SLEEP 135  tablet 1  . EPINEPHrine (EPIPEN 2-PAK) 0.3 mg/0.3 mL IJ SOAJ injection Inject 0.3 mg into the muscle once.     No current facility-administered medications on file prior to visit.     Wt 190 lb (86.2 kg)   BMI 28.06 kg/m    Objective:   Physical Exam  Constitutional: She is oriented to person, place, and time. She appears well-nourished.  Respiratory: Effort normal.  Neurological: She is alert and oriented to person, place, and time.  Skin:  Evidence of facial irritation/acne to bilateral lower cheeks and chin  Psychiatric: She has a normal mood and affect.           Assessment & Plan:

## 2019-02-24 NOTE — Assessment & Plan Note (Signed)
Increased symptoms recently, overall current regimen does seem to be helping.  Suspect symptoms are due to anniversary of her husband's passing coupled with COVID-19 adjustments. Discussed options for treatment including therapy and dose increase in her sertraline.  She opts for both.  We discussed risks of serotonin syndrome with increased dose of sertraline plus trazodone for which she takes nightly.  She verbalized understanding.  Increase sertraline to 125 mg.  Continue trazodone at 150 mg. Referral placed to therapy for further evaluation and treatment.

## 2019-02-24 NOTE — Patient Instructions (Addendum)
We have increased your dose of Zoloft to 125 mg.  You will receive a separate 25 mg tablet to take along with your 100 mg tablet.  Continue trazodone as needed for sleep.  You will be contacted regarding your referral to therapy.  Please let us know if you have not been contacted within one week.   Please update me via my chart in 3 to 4 weeks as discussed.  You can apply the topical face gel twice daily after washing your face for acne.  It was a pleasure to see you today! Allie Bossier, NP-C

## 2019-05-19 ENCOUNTER — Encounter: Payer: Self-pay | Admitting: Obstetrics and Gynecology

## 2019-06-22 ENCOUNTER — Other Ambulatory Visit: Payer: Self-pay | Admitting: Primary Care

## 2019-06-22 DIAGNOSIS — F411 Generalized anxiety disorder: Secondary | ICD-10-CM

## 2019-06-22 NOTE — Telephone Encounter (Signed)
How's she doing on the increased dose of Zoloft 125 mg?

## 2019-06-22 NOTE — Telephone Encounter (Signed)
Last prescribed on 25 mg on 02/24/2019 and 100 mg on 01/01/2019  . Last appointment on 02/24/2019. No future appointment

## 2019-06-24 MED ORDER — SERTRALINE HCL 100 MG PO TABS: 100.0000 mg | ORAL_TABLET | Freq: Every day | ORAL | 2 refills | Status: DC

## 2019-06-24 MED ORDER — SERTRALINE HCL 25 MG PO TABS: 25.0000 mg | ORAL_TABLET | Freq: Every day | ORAL | 2 refills | Status: DC

## 2019-06-24 NOTE — Telephone Encounter (Signed)
Noted, refill sent to pharmacy. 

## 2019-06-24 NOTE — Telephone Encounter (Signed)
Patient called back. She stated that she is much better in the 125 mg dosage. Patient would like a refill to [facility]

## 2019-08-20 ENCOUNTER — Other Ambulatory Visit: Payer: Self-pay | Admitting: Primary Care

## 2019-08-20 DIAGNOSIS — G4700 Insomnia, unspecified: Secondary | ICD-10-CM

## 2020-03-21 ENCOUNTER — Other Ambulatory Visit: Payer: Self-pay | Admitting: Primary Care

## 2020-03-21 DIAGNOSIS — G4700 Insomnia, unspecified: Secondary | ICD-10-CM

## 2020-03-24 NOTE — Telephone Encounter (Signed)
Patient needs follow-up visit for further refills.  I will send a 30-day supply to her pharmacy. Please schedule either CPE or follow-up visit.

## 2020-03-24 NOTE — Telephone Encounter (Signed)
LAST APPOINTMENT DATE: 10/10/2018   NEXT APPOINTMENT DATE: Visit date not found    LAST REFILL: 08/10/2019   QTY: #[id number] [id number]

## 2020-03-25 NOTE — Telephone Encounter (Signed)
Called patient and scheduled follow up visit for medication.

## 2020-03-30 ENCOUNTER — Ambulatory Visit: Payer: 59 | Admitting: Primary Care

## 2020-04-04 ENCOUNTER — Other Ambulatory Visit: Payer: Self-pay | Admitting: Primary Care

## 2020-04-04 DIAGNOSIS — G4700 Insomnia, unspecified: Secondary | ICD-10-CM

## 2020-04-06 ENCOUNTER — Ambulatory Visit: Payer: 59 | Admitting: Primary Care

## 2020-04-06 ENCOUNTER — Other Ambulatory Visit: Payer: Self-pay

## 2020-04-06 ENCOUNTER — Encounter: Payer: Self-pay | Admitting: Primary Care

## 2020-04-06 VITALS — BP 140/80 | HR 98 | Temp 98.6°F | Ht 69.0 in | Wt 209.0 lb

## 2020-04-06 DIAGNOSIS — E119 Type 2 diabetes mellitus without complications: Secondary | ICD-10-CM

## 2020-04-06 DIAGNOSIS — Z1159 Encounter for screening for other viral diseases: Secondary | ICD-10-CM

## 2020-04-06 DIAGNOSIS — G4700 Insomnia, unspecified: Secondary | ICD-10-CM

## 2020-04-06 DIAGNOSIS — Z1231 Encounter for screening mammogram for malignant neoplasm of breast: Secondary | ICD-10-CM

## 2020-04-06 DIAGNOSIS — Z23 Encounter for immunization: Secondary | ICD-10-CM | POA: Diagnosis not present

## 2020-04-06 DIAGNOSIS — R1904 Left lower quadrant abdominal swelling, mass and lump: Secondary | ICD-10-CM

## 2020-04-06 DIAGNOSIS — I10 Essential (primary) hypertension: Secondary | ICD-10-CM

## 2020-04-06 DIAGNOSIS — R1900 Intra-abdominal and pelvic swelling, mass and lump, unspecified site: Secondary | ICD-10-CM | POA: Insufficient documentation

## 2020-04-06 DIAGNOSIS — F411 Generalized anxiety disorder: Secondary | ICD-10-CM

## 2020-04-06 DIAGNOSIS — Z114 Encounter for screening for human immunodeficiency virus [HIV]: Secondary | ICD-10-CM

## 2020-04-06 DIAGNOSIS — F4321 Adjustment disorder with depressed mood: Secondary | ICD-10-CM

## 2020-04-06 HISTORY — DX: Intra-abdominal and pelvic swelling, mass and lump, unspecified site: R19.00

## 2020-04-06 LAB — LIPID PANEL
Cholesterol: 124 mg/dL (ref 0–200)
HDL: 55.6 mg/dL (ref >39.00)
LDL Cholesterol: 50 mg/dL (ref 0–99)
NonHDL: 68.53
Total CHOL/HDL Ratio: 2
Triglycerides: 91 mg/dL (ref 0.0–149.0)
VLDL: 18.2 mg/dL (ref 0.0–40.0)

## 2020-04-06 LAB — COMPREHENSIVE METABOLIC PANEL
ALT: 28 U/L (ref 0–35)
AST: 17 U/L (ref 0–37)
Albumin: 4.4 g/dL (ref 3.5–5.2)
Alkaline Phosphatase: 55 U/L (ref 39–117)
BUN: 12 mg/dL (ref 6–23)
CO2: 27 mEq/L (ref 19–32)
Calcium: 9.8 mg/dL (ref 8.4–10.5)
Chloride: 102 mEq/L (ref 96–112)
Creatinine, Ser: 0.76 mg/dL (ref 0.40–1.20)
GFR: 96.3 mL/min (ref >60.00)
Glucose, Bld: 307 mg/dL — ABNORMAL HIGH (ref 70–99)
Potassium: 4.2 mEq/L (ref 3.5–5.1)
Sodium: 137 mEq/L (ref 135–145)
Total Bilirubin: 0.5 mg/dL (ref 0.2–1.2)
Total Protein: 7 g/dL (ref 6.0–8.3)

## 2020-04-06 LAB — POCT GLYCOSYLATED HEMOGLOBIN (HGB A1C): Hemoglobin A1C: 10.1 % — AB (ref 4.0–5.6)

## 2020-04-06 LAB — MICROALBUMIN / CREATININE URINE RATIO
Creatinine,U: 209.3 mg/dL
Microalb Creat Ratio: 2 mg/g (ref 0.0–30.0)
Microalb, Ur: 4.3 mg/dL — ABNORMAL HIGH (ref 0.0–1.9)

## 2020-04-06 MED ORDER — GLIPIZIDE ER 5 MG PO TB24: 5.0000 mg | ORAL_TABLET | Freq: Every day | ORAL | 3 refills | Status: DC

## 2020-04-06 MED ORDER — TRAZODONE HCL 150 MG PO TABS: 150.0000 mg | ORAL_TABLET | Freq: Every day | ORAL | 1 refills | Status: DC

## 2020-04-06 MED ORDER — HYDROCHLOROTHIAZIDE 25 MG PO TABS: 25.0000 mg | ORAL_TABLET | Freq: Every day | ORAL | 3 refills | Status: DC

## 2020-04-06 MED ORDER — METFORMIN HCL ER 500 MG PO TB24: 500.0000 mg | ORAL_TABLET | Freq: Every day | ORAL | 3 refills | Status: DC

## 2020-04-06 MED ORDER — CARVEDILOL 12.5 MG PO TABS: ORAL_TABLET | ORAL | 3 refills | Status: DC

## 2020-04-06 NOTE — Assessment & Plan Note (Signed)
Uncontrolled and out of medications for quite some time. A1C today of 10.1.  Metformin caused GI upset in the past, will trial ER version. Rx for Metformin ER 500 mg sent to pharmacy. Will also add in Glipizide XL 5 mg daily.  Strongly advised she work on her diet and start exercising.   Foot exam today. Urine microalbumin pending. Pneumonia vaccine UTD. Eye exam UTD.  Follow up in 3 months.

## 2020-04-06 NOTE — Assessment & Plan Note (Signed)
Chronic for years, exam today suggestive of non complicated hernia. Given progressing size and exam today, we will obtain imaging for further evaluation. Consider general surgery referral.

## 2020-04-06 NOTE — Assessment & Plan Note (Signed)
Doing well on Trazodone 150 mg, continue same.

## 2020-04-06 NOTE — Progress Notes (Signed)
Subjective:    Patient ID: Michelle Marshall, female    DOB: 07/17/1965, 54 y.o.   MRN: [id number]  HPI  This visit occurred during the SARS-CoV-2 public health emergency.  Safety protocols were in place, including screening questions prior to the visit, additional usage of staff PPE, and extensive cleaning of exam room while observing appropriate contact time as indicated for disinfecting solutions.   Michelle Marshall is a 54 year old female with a history of type 2 diabetes, hypertension, iron deficiency anemia (abnormal uterine bleeding), GAD who presents today for follow up. She is also due for mammogram and colonoscopy. She is not ready for her colonoscopy as she has no one to drive her.   1) Type 2 diabetes: She has not been in for follow up since April 2020.  Current medications include: None  She is checking her blood glucose 0 times daily.  Last A1C: 10.1 today, 8.7 in April 2020 Last Eye Exam: UTD Last Foot Exam: Due Pneumonia Vaccination: Completed in 2018 ACE/ARB: None. Urine microalbumin due Statin: None.  BP Readings from Last 3 Encounters:  04/06/20 140/80  10/15/18 (!) 144/92  05/15/18 (!) 155/93   2) Essential Hypertension: Currently prescribed amlodipine 10 mg, HCTZ 25 mg, and carvedilol 12.5 mg BID. She's been off all medications for a few months. She is not checking her blood pressure at home but does have capability. She denies chest pain, headaches, numbness/tingling.  3) GAD/Grief: Currently prescribed Zoloft 125 mg for which she's been taking over the last year. Also managed on Trazodone 150 mg is feeling well managed on her current regimen, is needing refills of Trazodone.   4) Abdominal Wall Mass: Chronic for years and located to the lower periumbilical region. Over times she's noticed a gradual increase in size which has now become quite large. She denies pain. She has never undergone imaging of the mass.   Review of Systems  Respiratory: Negative for  shortness of breath.   Cardiovascular: Negative for chest pain.  Neurological: Negative for dizziness, numbness and headaches.  Psychiatric/Behavioral: Negative for sleep disturbance. The patient is not nervous/anxious.        Feels well managed on Zoloft 125 mg.       Past Medical History:  Diagnosis Date  . Anemia   . Complication of anesthesia 2002   DURING SCAR TISSUE EXCISION FROM FALLOPIAN TUBES(2002), PT WAS GIVEN GENERAL ANESTHESIA AND PT BEGAN HAVING A REACTION TO THE ANESTHESIA AND LIPS AND TONGUE STARTED SWELLING AND THE SURGERY HAD TO BE STOPPED DUE TO AIRWAY CLOSING UP-PT HAD GA PRIOR TO THIS FOR HER GALLBLADDER WITH NO PROBLEM PREVIOUSLY  . Depression   . Diabetes mellitus without complication ([place])   . Dysrhythmia    H/O TACHYCARDIA. stress and anxiety brings it on  . Family history of adverse reaction to anesthesia    [place] [facility]  . Hypertension   . Irregular periods   . Vitamin D deficiency    per patient, no longer a problem     Social History   Socioeconomic History  . Marital status: Married    Spouse name: Not on file  . Number of children: Not on file  . Years of education: Not on file  . Highest education level: Not on file  Occupational History  . Not on file  Tobacco Use  . Smoking status: Former Smoker    Packs/day: 0.25    Years: 20.00    Pack years: 5.00  Types: Cigarettes    Quit date: 09/05/2015    Years since quitting: 4.5  . Smokeless tobacco: Never Used  Vaping Use  . Vaping Use: Never used  Substance and Sexual Activity  . Alcohol use: No    Alcohol/week: 0.0 standard drinks    Comment: occasional glass of wine  . Drug use: No  . Sexual activity: Not Currently    Birth control/protection: None  Other Topics Concern  . Not on file  Social History Narrative   Married.   1 child.   She works as a Designer, industrial/product   Enjoys shopping, traveling.    Social Determinants of Health   Financial Resource Strain:   .  Difficulty of Paying Living Expenses: Not on file  Food Insecurity:   . Worried About Charity fundraiser in the Last Year: Not on file  . Ran Out of Food in the Last Year: Not on file  Transportation Needs:   . Lack of Transportation (Medical): Not on file  . Lack of Transportation (Non-Medical): Not on file  Physical Activity:   . Days of Exercise per Week: Not on file  . Minutes of Exercise per Session: Not on file  Stress:   . Feeling of Stress : Not on file  Social Connections:   . Frequency of Communication with Friends and Family: Not on file  . Frequency of Social Gatherings with Friends and Family: Not on file  . Attends Religious Services: Not on file  . Active Member of Clubs or Organizations: Not on file  . Attends Archivist Meetings: Not on file  . Marital Status: Not on file  Intimate Partner Violence:   . Fear of Current or Ex-Partner: Not on file  . Emotionally Abused: Not on file  . Physically Abused: Not on file  . Sexually Abused: Not on file    Past Surgical History:  Procedure Laterality Date  . CHOLECYSTECTOMY    . DILATATION & CURETTAGE/HYSTEROSCOPY WITH MYOSURE N/A 09/30/2017   Procedure: DILATATION & CURETTAGE/HYSTEROSCOPY;  Surgeon: Defrancesco, Alanda Slim, MD;  Location: [facility] ORS;  Service: Gynecology;  Laterality: N/A;  . DILITATION & CURRETTAGE/HYSTROSCOPY WITH NOVASURE ABLATION N/A 09/19/2015   Procedure: DILATATION & CURETTAGE/HYSTEROSCOPY WITH NOVASURE ABLATION;  Surgeon: Brayton Mars, MD;  Location: [facility] ORS;  Service: Gynecology;  Laterality: N/A;  . JOINT REPLACEMENT Left    30 years ago. knee  . KNEE ARTHROSCOPY Left     Family History  Problem Relation Age of Onset  . Cancer Father        throat  . Hypertension Mother   . Diabetes Paternal Grandmother   . Breast cancer Neg Hx     Allergies  Allergen Reactions  . Lisinopril Swelling    Lips and face  . Losartan Other (See Comments)    Mouth swollen, rash and  itching  . Wellbutrin [Bupropion] Hives and Swelling    Swelling of mouth  . Percocet [Oxycodone-Acetaminophen] Anxiety    Current Outpatient Medications on File Prior to Visit  Medication Sig Dispense Refill  . aspirin EC 81 MG tablet Take 81 mg by mouth daily.    Marland Kitchen EPINEPHrine (EPIPEN 2-PAK) 0.3 mg/0.3 mL IJ SOAJ injection Inject 0.3 mg into the muscle once.    . sertraline (ZOLOFT) 100 MG tablet Take 1 tablet (100 mg total) by mouth daily. 90 tablet 2  . sertraline (ZOLOFT) 25 MG tablet Take 1 tablet (25 mg total) by mouth daily. Along with 100 mg  tablet. 90 tablet 2  . Estradiol-Progesterone (BIJUVA) 1-100 MG CAPS Take 100 mg by mouth daily. (Patient not taking: Reported on 04/06/2020) 30 capsule 12   No current facility-administered medications on file prior to visit.    BP 140/80   Pulse 98   Temp 98.6 F (37 C) (Temporal)   Ht 5\' 9"  (1.753 m)   Wt 209 lb (94.8 kg)   SpO2 98%   BMI 30.86 kg/m    Objective:   Physical Exam Cardiovascular:     Rate and Rhythm: Normal rate and regular rhythm.  Pulmonary:     Effort: Pulmonary effort is normal.     Breath sounds: Normal breath sounds.  Musculoskeletal:     Cervical back: Neck supple.  Skin:    General: Skin is warm and dry.  Psychiatric:        Mood and Affect: Mood normal.            Assessment & Plan:

## 2020-04-06 NOTE — Assessment & Plan Note (Signed)
Stable on Zoloft 125 mg and Trazodone 150 mg. Continue current regimen.

## 2020-04-06 NOTE — Assessment & Plan Note (Signed)
Overall feels well controlled on Zoloft 125 mg. Continue same.

## 2020-04-06 NOTE — Patient Instructions (Addendum)
Stop by the lab prior to leaving today. I will notify you of your results once received.   Start Metformin ER 500 mg once daily with breakfast for diabetes. Start Glipizide XL 5 mg once daily for diabetes.  Resume hydrochlorothiazide 25 mg daily for blood pressure. Resume carvedilol 12.5 mg twice daily for blood pressure and heart rate.  We changed your Trazodone to 150 mg, take only one tablet at bedtime for sleep.  Start checking your blood sugar levels.  Appropriate times to check your blood sugar levels are:  -Before any meal (breakfast, lunch, dinner) -Two hours after any meal (breakfast, lunch, dinner) -Bedtime  Record your readings and notify me if you continue to consistently run at or above 200   Please schedule a follow up appointment in 3 months for diabetes check  It was a pleasure to see you today!     Call [facility] at number below to make appointment for Mammogram.  Schedule your mammogram today! Call [phone] to learn more or schedule online.  Influenza (Flu) Vaccine (Inactivated or Recombinant): What You Need to Know 1. Why get vaccinated? Influenza vaccine can prevent influenza (flu). Flu is a contagious disease that spreads around the [place] every year, usually between October and May. Anyone can get the flu, but it is more dangerous for some people. Infants and young children, people 67 years of age and older, pregnant women, and people with certain health conditions or a weakened immune system are at greatest risk of flu complications. Pneumonia, bronchitis, sinus infections and ear infections are examples of flu-related complications. If you have a medical condition, such as heart disease, cancer or diabetes, flu can make it worse. Flu can cause fever and chills, sore throat, muscle aches, fatigue, cough, headache, and runny or stuffy nose. Some people may have vomiting and diarrhea, though this is more common in children than adults. Each year thousands  of people in the [place] die from flu, and many more are hospitalized. Flu vaccine prevents millions of illnesses and flu-related visits to the doctor each year. 2. Influenza vaccine CDC recommends everyone 60 months of age and older get vaccinated every flu season. Children 6 months through 73 years of age may need 2 doses during a single flu season. Everyone else needs only 1 dose each flu season. It takes about 2 weeks for protection to develop after vaccination. There are many flu viruses, and they are always changing. Each year a new flu vaccine is made to protect against three or four viruses that are likely to cause disease in the upcoming flu season. Even when the vaccine doesn't exactly match these viruses, it may still provide some protection. Influenza vaccine does not cause flu. Influenza vaccine may be given at the same time as other vaccines. 3. Talk with your health care provider Tell your vaccine provider if the person getting the vaccine:  Has had an allergic reaction after a previous dose of influenza vaccine, or has any severe, life-threatening allergies.  Has ever had Guillain-Barr Syndrome (also called GBS). In some cases, your health care provider may decide to postpone influenza vaccination to a future visit. People with minor illnesses, such as a cold, may be vaccinated. People who are moderately or severely ill should usually wait until they recover before getting influenza vaccine. Your health care provider can give you more information. 4. Risks of a vaccine reaction  Soreness, redness, and swelling where shot is given, fever, muscle aches, and headache can happen  after influenza vaccine.  There may be a very small increased risk of Guillain-Barr Syndrome (GBS) after inactivated influenza vaccine (the flu shot). Young children who get the flu shot along with pneumococcal vaccine (PCV13), and/or DTaP vaccine at the same time might be slightly more likely to have a  seizure caused by fever. Tell your health care provider if a child who is getting flu vaccine has ever had a seizure. People sometimes faint after medical procedures, including vaccination. Tell your provider if you feel dizzy or have vision changes or ringing in the ears. As with any medicine, there is a very remote chance of a vaccine causing a severe allergic reaction, other serious injury, or death. 5. What if there is a serious problem? An allergic reaction could occur after the vaccinated person leaves the clinic. If you see signs of a severe allergic reaction (hives, swelling of the face and throat, difficulty breathing, a fast heartbeat, dizziness, or weakness), call 9-1-1 and get the person to the nearest hospital. For other signs that concern you, call your health care provider. Adverse reactions should be reported to the Vaccine Adverse Event Reporting System (VAERS). Your health care provider will usually file this report, or you can do it yourself. Visit the VAERS website at www.vaers.SamedayNews.es or call 579 471 1290.VAERS is only for reporting reactions, and VAERS staff do not give medical advice. 6. The National Vaccine Injury Compensation Program The [employer] Vaccine Injury Compensation Program (VICP) is a federal program that was created to compensate people who may have been injured by certain vaccines. Visit the VICP website at GoldCloset.com.ee or call 2496329745 to learn about the program and about filing a claim. There is a time limit to file a claim for compensation. 7. How can I learn more?  Ask your healthcare provider.  Call your local or state health department.  Contact the Centers for Disease Control and Prevention (CDC): ? Call 646-754-1954 (1-800-CDC-INFO) or ? Visit CDC's https://gibson.com/ Vaccine Information Statement (Interim) Inactivated Influenza Vaccine (02/20/2018) This information is not intended to replace advice given to you by your health  care provider. Make sure you discuss any questions you have with your health care provider. Document Revised: 10/14/2018 Document Reviewed: 02/24/2018 Elsevier Patient Education  [place].

## 2020-04-06 NOTE — Assessment & Plan Note (Signed)
Out of meds for several weeks, BP above goal today. She doesn't like the way she feels with amlodipine, so we will not resume.  Based off of her blood pressure reading today, she doesn't need to resume all three medications. She is noted to be borderline tachycardic.  Will resume carvedilol and HCTZ. Labs pending.  She will monitor blood pressure at home and update with readings remaining above goal of 130/90.

## 2020-04-07 LAB — HEPATITIS C ANTIBODY
Hepatitis C Ab: NONREACTIVE
SIGNAL TO CUT-OFF: 0.01 (ref <1.00)

## 2020-04-07 LAB — HIV ANTIBODY (ROUTINE TESTING W REFLEX): HIV 1&2 Ab, 4th Generation: NONREACTIVE

## 2020-04-11 NOTE — Addendum Note (Signed)
Addended by: Francella Solian on: 04/11/2020 10:17 AM   Modules accepted: Orders

## 2020-04-14 ENCOUNTER — Other Ambulatory Visit: Payer: Self-pay | Admitting: Primary Care

## 2020-04-14 DIAGNOSIS — R1909 Other intra-abdominal and pelvic swelling, mass and lump: Secondary | ICD-10-CM

## 2020-04-15 ENCOUNTER — Ambulatory Visit
Admission: RE | Admit: 2020-04-15 | Discharge: 2020-04-15 | Disposition: A | Discharge status: Home / Self Care | Payer: 59 | Source: Ambulatory Visit | Attending: Primary Care | Admitting: Primary Care

## 2020-04-15 ENCOUNTER — Other Ambulatory Visit: Payer: Self-pay

## 2020-04-15 DIAGNOSIS — R1909 Other intra-abdominal and pelvic swelling, mass and lump: Secondary | ICD-10-CM | POA: Diagnosis present

## 2020-05-01 ENCOUNTER — Other Ambulatory Visit: Payer: Self-pay | Admitting: Primary Care

## 2020-05-01 DIAGNOSIS — G4700 Insomnia, unspecified: Secondary | ICD-10-CM

## 2020-06-30 ENCOUNTER — Other Ambulatory Visit: Payer: Self-pay | Admitting: Primary Care

## 2020-06-30 DIAGNOSIS — F411 Generalized anxiety disorder: Secondary | ICD-10-CM

## 2020-07-06 ENCOUNTER — Ambulatory Visit: Payer: 59 | Admitting: Primary Care

## 2020-07-13 ENCOUNTER — Ambulatory Visit: Payer: 59 | Admitting: Primary Care

## 2020-07-13 DIAGNOSIS — Z0289 Encounter for other administrative examinations: Secondary | ICD-10-CM

## 2020-08-23 ENCOUNTER — Other Ambulatory Visit: Payer: Self-pay | Admitting: Primary Care

## 2020-08-23 DIAGNOSIS — F411 Generalized anxiety disorder: Secondary | ICD-10-CM

## 2020-08-24 ENCOUNTER — Other Ambulatory Visit: Payer: Self-pay

## 2020-08-24 DIAGNOSIS — F411 Generalized anxiety disorder: Secondary | ICD-10-CM

## 2020-08-24 MED ORDER — SERTRALINE HCL 100 MG PO TABS: ORAL_TABLET | ORAL | 0 refills | Status: DC

## 2020-08-24 NOTE — Telephone Encounter (Signed)
Noted  

## 2020-08-24 NOTE — Telephone Encounter (Signed)
Fyi patient did not keep app in January. I have called and made follow up for next week. Did not have supply to last until appointment. Called in 30 day and informed that she will not get further refills if appointment missed.

## 2020-08-31 ENCOUNTER — Ambulatory Visit: Payer: 59 | Admitting: Primary Care

## 2020-08-31 ENCOUNTER — Other Ambulatory Visit: Payer: Self-pay

## 2020-08-31 ENCOUNTER — Ambulatory Visit (INDEPENDENT_AMBULATORY_CARE_PROVIDER_SITE_OTHER)
Admission: RE | Admit: 2020-08-31 | Discharge: 2020-08-31 | Disposition: A | Discharge status: Home / Self Care | Payer: 59 | Source: Ambulatory Visit | Attending: Primary Care | Admitting: Primary Care

## 2020-08-31 ENCOUNTER — Encounter: Payer: Self-pay | Admitting: Primary Care

## 2020-08-31 VITALS — BP 136/80 | HR 100 | Temp 97.7°F | Ht 69.0 in | Wt 217.0 lb

## 2020-08-31 DIAGNOSIS — E119 Type 2 diabetes mellitus without complications: Secondary | ICD-10-CM

## 2020-08-31 DIAGNOSIS — M79671 Pain in right foot: Secondary | ICD-10-CM | POA: Diagnosis not present

## 2020-08-31 HISTORY — DX: Pain in right foot: M79.671

## 2020-08-31 LAB — POCT GLYCOSYLATED HEMOGLOBIN (HGB A1C): Hemoglobin A1C: 9.3 % — AB (ref 4.0–5.6)

## 2020-08-31 MED ORDER — GLIPIZIDE ER 10 MG PO TB24
10.0000 mg | ORAL_TABLET | Freq: Every day | ORAL | 3 refills | Status: DC
Start: 2020-08-31 — End: 2021-09-14

## 2020-08-31 MED ORDER — IBUPROFEN 800 MG PO TABS: 800.0000 mg | ORAL_TABLET | Freq: Three times a day (TID) | ORAL | 0 refills | Status: DC | PRN

## 2020-08-31 MED ORDER — SITAGLIPTIN PHOSPHATE 100 MG PO TABS: 100.0000 mg | ORAL_TABLET | Freq: Every day | ORAL | 3 refills | Status: DC

## 2020-08-31 NOTE — Assessment & Plan Note (Signed)
Improved but uncontrolled with A1C today of 9.3.  She cannot tolerate Metformin XR due to GI upset and diarrhea, will discontinue.  Continue Glipizide, but increase dose to 10 mg XL. Add Januvia 100 mg.  Repeat A1C in 3 months. Follow up in office in 6 months.

## 2020-08-31 NOTE — Patient Instructions (Addendum)
Complete xray(s) prior to leaving today. I will notify you of your results once received.  Stop taking Metformin XR 500 mg for diabetes.  Start taking sitagliptan (Januvia) 100 mg for diabetes. Take once daily. Continue taking glipizide XL 10 mg once daily for diabetes.   Continue checking your blood sugars.  Please schedule a follow up appointment in 3 months for diabetes check.  It was a pleasure to see you today!

## 2020-08-31 NOTE — Assessment & Plan Note (Signed)
Acute since injury last week. Checking plain films today.  Rx for ibuprofen 800 mg sent to pharmacy.

## 2020-08-31 NOTE — Progress Notes (Signed)
Subjective:    Patient ID: Michelle Marshall, female    DOB: [birth year], 55 y.o.   MRN: [id number]  HPI  This visit occurred during the SARS-CoV-2 public health emergency.  Safety protocols were in place, including screening questions prior to the visit, additional usage of staff PPE, and extensive cleaning of exam room while observing appropriate contact time as indicated for disinfecting solutions.   [name] is a 55 year old female with a history of type 2 diabetes, hypertension, GAD who presents today for follow up of diabetes and a chief complaint of foot pain.  1) Type 2 Diabetes:  Current medications include: Metformin XR 500 mg, Glipizide XL 5 mg. She has a lot of GI upset with Metformin XR including diarrhea.   She is checking his/her blood glucose infrequently and is getting readings of 108's-200.  Last A1C: 10.1 in September 2021, 9.3 today  Last Eye Exam: Due in May 2022. Last Foot Exam: UTD Pneumonia Vaccination: 2018 ACE/ARB: None. Urine micro UTD Statin: None.  2) Foot Pain: Acute to the right 4th and 5th toe which began shortly after tripping over a dog toy, hit her foot on the [employer]. She's noticed increased bruising and swelling since the incident. She's been taking Ibuprofen 800 mg with improvement, would like a prescription for the 800 mg dose.   BP Readings from Last 3 Encounters:  08/31/20 136/80  04/06/20 140/80  10/15/18 (!) 144/92     Review of Systems  Respiratory: Negative for shortness of breath.   Cardiovascular: Negative for chest pain.  Musculoskeletal: Positive for arthralgias and joint swelling.  Skin: Positive for color change.  Neurological: Negative for dizziness.       Past Medical History:  Diagnosis Date  . Anemia   . Complication of anesthesia 2002   DURING SCAR TISSUE EXCISION FROM FALLOPIAN TUBES(2002), PT WAS GIVEN GENERAL ANESTHESIA AND PT BEGAN HAVING A REACTION TO THE ANESTHESIA AND LIPS AND TONGUE STARTED  SWELLING AND THE SURGERY HAD TO BE STOPPED DUE TO AIRWAY CLOSING UP-PT HAD GA PRIOR TO THIS FOR HER GALLBLADDER WITH NO PROBLEM PREVIOUSLY  . Depression   . Diabetes mellitus without complication ([place])   . Dysrhythmia    H/O TACHYCARDIA. stress and anxiety brings it on  . Family history of adverse reaction to anesthesia    [place] [facility]  . Hypertension   . Irregular periods   . Vitamin D deficiency    per patient, no longer a problem     Social History   Socioeconomic History  . Marital status: Married    Spouse name: Not on file  . Number of children: Not on file  . Years of education: Not on file  . Highest education level: Not on file  Occupational History  . Not on file  Tobacco Use  . Smoking status: Former Smoker    Packs/day: 0.25    Years: 20.00    Pack years: 5.00    Types: Cigarettes    Quit date: 09/05/2015    Years since quitting: 4.9  . Smokeless tobacco: Never Used  Vaping Use  . Vaping Use: Never used  Substance and Sexual Activity  . Alcohol use: No    Alcohol/week: 0.0 standard drinks    Comment: occasional glass of wine  . Drug use: No  . Sexual activity: Not Currently    Birth control/protection: None  Other Topics Concern  . Not on file  Social History Narrative   Married.  1 child.   She works as a Designer, industrial/product   Enjoys shopping, traveling.    Social Determinants of Health   Financial Resource Strain: Not on file  Food Insecurity: Not on file  Transportation Needs: Not on file  Physical Activity: Not on file  Stress: Not on file  Social Connections: Not on file  Intimate Partner Violence: Not on file    Past Surgical History:  Procedure Laterality Date  . CHOLECYSTECTOMY    . DILATATION & CURETTAGE/HYSTEROSCOPY WITH MYOSURE N/A 09/30/2017   Procedure: DILATATION & CURETTAGE/HYSTEROSCOPY;  Surgeon: Defrancesco, Alanda Slim, MD;  Location: [facility] ORS;  Service: Gynecology;  Laterality: N/A;  . DILITATION &  CURRETTAGE/HYSTROSCOPY WITH NOVASURE ABLATION N/A 09/19/2015   Procedure: DILATATION & CURETTAGE/HYSTEROSCOPY WITH NOVASURE ABLATION;  Surgeon: Brayton Mars, MD;  Location: [facility] ORS;  Service: Gynecology;  Laterality: N/A;  . JOINT REPLACEMENT Left    30 years ago. knee  . KNEE ARTHROSCOPY Left     Family History  Problem Relation Age of Onset  . Cancer Father        throat  . Hypertension Mother   . Diabetes Paternal Grandmother   . Breast cancer Neg Hx     Allergies  Allergen Reactions  . Lisinopril Swelling    Lips and face  . Losartan Other (See Comments)    Mouth swollen, rash and itching  . Wellbutrin [Bupropion] Hives and Swelling    Swelling of mouth  . Percocet [Oxycodone-Acetaminophen] Anxiety    Current Outpatient Medications on File Prior to Visit  Medication Sig Dispense Refill  . aspirin EC 81 MG tablet Take 81 mg by mouth daily.    . carvedilol (COREG) 12.5 MG tablet TAKE 1 TABLET(12.5 MG) BY MOUTH TWICE DAILY WITH A MEAL for heart rate 180 tablet 3  . EPINEPHrine 0.3 mg/0.3 mL IJ SOAJ injection Inject 0.3 mg into the muscle once.    . Estradiol-Progesterone (BIJUVA) 1-100 MG CAPS Take 100 mg by mouth daily. 30 capsule 12  . hydrochlorothiazide (HYDRODIURIL) 25 MG tablet Take 1 tablet (25 mg total) by mouth daily. For blood pressure. 90 tablet 3  . sertraline (ZOLOFT) 100 MG tablet TAKE 1 TABLET(100 MG) BY MOUTH DAILY 30 tablet 0  . sertraline (ZOLOFT) 25 MG tablet Take 1 tablet (25 mg total) by mouth daily. Along with 100 mg tablet. 90 tablet 2  . traZODone (DESYREL) 150 MG tablet Take 1 tablet (150 mg total) by mouth at bedtime. For sleep. 90 tablet 1   No current facility-administered medications on file prior to visit.    BP 136/80   Pulse 100   Temp 97.7 F (36.5 C) (Temporal)   Ht 5\' 9"  (1.753 m)   Wt 217 lb (98.4 kg)   SpO2 98%   BMI 32.05 kg/m    Objective:   Physical Exam Constitutional:      Appearance: She is well-nourished.   Cardiovascular:     Rate and Rhythm: Normal rate and regular rhythm.  Pulmonary:     Effort: Pulmonary effort is normal.     Breath sounds: Normal breath sounds.  Musculoskeletal:     Cervical back: Neck supple.     Comments: Mild swelling with bluish bruising to right 4th dorsal toe. Limited ROM due to pain. Mild tenderness at metatarsal joint.   Skin:    General: Skin is warm and dry.  Psychiatric:        Mood and Affect: Mood and affect normal.  Assessment & Plan:

## 2020-09-07 ENCOUNTER — Other Ambulatory Visit: Payer: Self-pay | Admitting: Primary Care

## 2020-09-07 DIAGNOSIS — M79671 Pain in right foot: Secondary | ICD-10-CM

## 2020-10-07 ENCOUNTER — Other Ambulatory Visit: Payer: Self-pay | Admitting: Primary Care

## 2020-10-07 DIAGNOSIS — G4700 Insomnia, unspecified: Secondary | ICD-10-CM

## 2020-11-22 ENCOUNTER — Other Ambulatory Visit: Payer: Self-pay

## 2020-11-22 ENCOUNTER — Encounter: Payer: Self-pay | Admitting: Primary Care

## 2020-11-22 ENCOUNTER — Telehealth (INDEPENDENT_AMBULATORY_CARE_PROVIDER_SITE_OTHER): Payer: 59 | Admitting: Primary Care

## 2020-11-22 VITALS — Ht 69.0 in | Wt 209.0 lb

## 2020-11-22 DIAGNOSIS — T7840XA Allergy, unspecified, initial encounter: Secondary | ICD-10-CM

## 2020-11-22 MED ORDER — PREDNISONE 20 MG PO TABS: ORAL_TABLET | ORAL | 0 refills | Status: DC

## 2020-11-22 NOTE — Assessment & Plan Note (Addendum)
Allergy to make up product that was placed three days ago. No improvement with OTC products.  Exam today consistent for allergic reaction. Will treat with prednisone 40 mg daily for 5 days. She will update.   There is no compromise to respiratory status.

## 2020-11-22 NOTE — Patient Instructions (Signed)
Start prednisone 20 mg tablets. Take 2 tablets daily for 5 days.  Please update me if no improvement within a few days.  It was a pleasure to see you today! Allie Bossier, NP-C

## 2020-11-22 NOTE — Progress Notes (Signed)
Patient ID: Michelle Marshall, female    DOB: Aug 08, 1965, 55 y.o.   MRN: [id number]  Virtual visit completed through [employer], a video enabled telemedicine application. Due to national recommendations of social distancing due to COVID-19, a virtual visit is felt to be most appropriate for this patient at this time. Reviewed limitations, risks, security and privacy concerns of performing a virtual visit and the availability of in person appointments. I also reviewed that there may be a patient responsible charge related to this service. The patient agreed to proceed.   Patient location: home Provider location: [place] at [facility], office Persons participating in this virtual visit: patient, provider  If any vitals were documented, they were collected by patient at home unless specified below.    Ht 5\' 9"  (1.753 m)   Wt 209 lb (94.8 kg)   BMI 30.86 kg/m    CC: Eye Swelling Subjective:   HPI: Michelle Marshall is a 55 y.o. female with a history of type 2 diabetes, hypertension, seasonal allergies presenting on 11/22/2020 for   Her swelling is located to the bilateral eyes.   She presented three days ago to a make up studio to have some eye dye applied to her eye lashes. Since then she's noticed itching, swelling, watery eyes, eyes matted shut. She's been flushing her eyes out, taking Benadryl, Claritin, using allergy eye drops, and saline drops with no improvement.   She's tried to wash off the make up dye as much as possible but some remains. She does have an allergy to some hair dying products, has never had an allergy to make up products OTC.      Relevant past medical, surgical, family and social history reviewed and updated as indicated. Interim medical history since our last visit reviewed. Allergies and medications reviewed and updated. Outpatient Medications Prior to Visit  Medication Sig Dispense Refill  . aspirin EC 81 MG tablet Take 81 mg by mouth daily.    .  carvedilol (COREG) 12.5 MG tablet TAKE 1 TABLET(12.5 MG) BY MOUTH TWICE DAILY WITH A MEAL for heart rate 180 tablet 3  . EPINEPHrine 0.3 mg/0.3 mL IJ SOAJ injection Inject 0.3 mg into the muscle once.    . Estradiol-Progesterone (BIJUVA) 1-100 MG CAPS Take 100 mg by mouth daily. 30 capsule 12  . glipiZIDE (GLUCOTROL XL) 10 MG 24 hr tablet Take 1 tablet (10 mg total) by mouth daily with breakfast. For diabetes. 90 tablet 3  . hydrochlorothiazide (HYDRODIURIL) 25 MG tablet Take 1 tablet (25 mg total) by mouth daily. For blood pressure. 90 tablet 3  . ibuprofen (ADVIL) 800 MG tablet Take 1 tablet (800 mg total) by mouth every 8 (eight) hours as needed for moderate pain. 30 tablet 0  . sertraline (ZOLOFT) 100 MG tablet TAKE 1 TABLET(100 MG) BY MOUTH DAILY 30 tablet 0  . sertraline (ZOLOFT) 25 MG tablet Take 1 tablet (25 mg total) by mouth daily. Along with 100 mg tablet. 90 tablet 2  . sitaGLIPtin (JANUVIA) 100 MG tablet Take 1 tablet (100 mg total) by mouth daily. For diabetes. 90 tablet 3  . traZODone (DESYREL) 150 MG tablet TAKE 1 TABLET(150 MG) BY MOUTH AT BEDTIME FOR SLEEP 90 tablet 0   No facility-administered medications prior to visit.     Per HPI unless specifically indicated in ROS section below Review of Systems  Eyes: Positive for itching.       See HPI   Objective:  Ht 5\' 9"  (1.753 m)  Wt 209 lb (94.8 kg)   BMI 30.86 kg/m   Wt Readings from Last 3 Encounters:  11/22/20 209 lb (94.8 kg)  08/31/20 217 lb (98.4 kg)  04/06/20 209 lb (94.8 kg)       Physical exam: Gen: alert, NAD, not ill appearing Pulm: speaks in complete sentences without increased work of breathing Psych: normal mood, normal thought content  Eyes: Watery, slightly swollen to upper lids. No obvious discharge although exam was difficult.     Results for orders placed or performed in visit on 08/31/20  POCT glycosylated hemoglobin (Hb A1C)  Result Value Ref Range   Hemoglobin A1C 9.3 (A) 4.0 - 5.6 %    HbA1c POC (<> result, manual entry)     HbA1c, POC (prediabetic range)     HbA1c, POC (controlled diabetic range)     Assessment & Plan:   Problem List Items Addressed This Visit      Other   Allergic reaction - Primary    Allergy to make up product that was placed three days ago. No improvement with OTC products.  Exam today consistent for allergic reaction. Will treat with prednisone 40 mg daily for 5 days. She will update.   There is no compromise to respiratory status.      Relevant Medications   predniSONE (DELTASONE) 20 MG tablet       Meds ordered this encounter  Medications  . predniSONE (DELTASONE) 20 MG tablet    Sig: Take 2 tablets by mouth once daily for five days.    Dispense:  10 tablet    Refill:  0    Order Specific Question:   Supervising Provider    Answer:   BEDSOLE, AMY E [[id number]]   No orders of the defined types were placed in this encounter.   I discussed the assessment and treatment plan with the patient. The patient was provided an opportunity to ask questions and all were answered. The patient agreed with the plan and demonstrated an understanding of the instructions. The patient was advised to call back or seek an in-person evaluation if the symptoms worsen or if the condition fails to improve as anticipated.  Follow up plan:  Start prednisone 20 mg tablets. Take 2 tablets daily for 5 days.  Please update me if no improvement within a few days.  It was a pleasure to see you today! Allie Bossier, NP-C    Pleas Koch, NP

## 2020-12-29 ENCOUNTER — Ambulatory Visit: Payer: 59 | Admitting: Primary Care

## 2021-01-05 ENCOUNTER — Encounter: Payer: Self-pay | Admitting: Primary Care

## 2021-01-05 ENCOUNTER — Other Ambulatory Visit: Payer: Self-pay

## 2021-01-05 ENCOUNTER — Ambulatory Visit: Payer: 59 | Admitting: Primary Care

## 2021-01-05 VITALS — BP 126/84 | HR 82 | Temp 97.7°F | Ht 69.0 in | Wt 216.0 lb

## 2021-01-05 DIAGNOSIS — E119 Type 2 diabetes mellitus without complications: Secondary | ICD-10-CM

## 2021-01-05 DIAGNOSIS — Z1211 Encounter for screening for malignant neoplasm of colon: Secondary | ICD-10-CM

## 2021-01-05 DIAGNOSIS — F411 Generalized anxiety disorder: Secondary | ICD-10-CM | POA: Diagnosis not present

## 2021-01-05 DIAGNOSIS — Z720 Tobacco use: Secondary | ICD-10-CM

## 2021-01-05 DIAGNOSIS — I10 Essential (primary) hypertension: Secondary | ICD-10-CM | POA: Diagnosis not present

## 2021-01-05 LAB — POCT GLYCOSYLATED HEMOGLOBIN (HGB A1C): Hemoglobin A1C: 9.1 % — AB (ref 4.0–5.6)

## 2021-01-05 MED ORDER — OZEMPIC (0.25 OR 0.5 MG/DOSE) 2 MG/1.5ML ~~LOC~~ SOPN: PEN_INJECTOR | SUBCUTANEOUS | 0 refills | Status: DC

## 2021-01-05 MED ORDER — SERTRALINE HCL 25 MG PO TABS: 25.0000 mg | ORAL_TABLET | Freq: Every day | ORAL | 3 refills | Status: DC

## 2021-01-05 MED ORDER — VARENICLINE TARTRATE 1 MG PO TABS: 1.0000 mg | ORAL_TABLET | Freq: Two times a day (BID) | ORAL | 0 refills | Status: DC

## 2021-01-05 MED ORDER — HYDROCHLOROTHIAZIDE 25 MG PO TABS: 25.0000 mg | ORAL_TABLET | Freq: Every day | ORAL | 3 refills | Status: DC

## 2021-01-05 MED ORDER — SERTRALINE HCL 100 MG PO TABS: ORAL_TABLET | ORAL | 3 refills | Status: DC

## 2021-01-05 MED ORDER — VARENICLINE TARTRATE 0.5 MG X 11 & 1 MG X 42 PO MISC
ORAL | 0 refills | Status: DC
Start: 2021-01-05 — End: 2021-04-07

## 2021-01-05 NOTE — Patient Instructions (Addendum)
Stop taking Januvia 100 mg for diabetes.  Start Ozempic. Inject 0.25 mg once weekly for four weeks, then increase to 0.5 mg once weekly thereafter.  Continue Glipizide.  Monitor your blood sugars as discussed. Notify me if you continue to see readings at or above 150 after 4-6 weeks.   Increase exercise. You should be getting 150 minutes of moderate intensity exercise weekly.  Stop eating fast food and take out food.  Ensure you are consuming 64 ounces of water daily.  Before you start Chantix you must choose a smoking quit date. The quit date needs to be within the first two weeks of starting the pill pack.   Start with the Starting Month Pack and then advance to the Continuing Month Pack.  Please schedule a follow up visit for 3 months.  It was a pleasure to see you today!

## 2021-01-05 NOTE — Assessment & Plan Note (Signed)
Well controlled today. Refills provided for HCTZ 25 mg

## 2021-01-05 NOTE — Assessment & Plan Note (Signed)
Agree to increase Zoloft back up to 125 mg.  Refills provided for each dose.

## 2021-01-05 NOTE — Assessment & Plan Note (Signed)
Chronic for three years, ready to quit.  Discussed options including OTC treatment, she still would like to try Chantix. Rx for starter and continuing packs sent to pharmacy.  Discussed how to start, when to start, and to stop smoking within one week of taking Chantix.  She will update.

## 2021-01-05 NOTE — Assessment & Plan Note (Signed)
Uncontrolled with A1C today of 9.1.  Diet isn't great, discussed this today. Also discussed to increase exercise.   Agree to try Ozempic. Will start at 0.25 mg weekly x 4 weeks, then increase to 0.5 mg weekly thereafter.  Stop Januvia 100 mg.  Continue Glipizide XL 10 mg.  Cannot tolerate Metformin XR.  We will see her back in 3 months.

## 2021-01-05 NOTE — Progress Notes (Signed)
Subjective:    Patient ID: Michelle Marshall, female    DOB: 1966/01/27, 55 y.o.   MRN: [id number]  HPI  Michelle Marshall is a very pleasant 55 y.o. female with a history of type 2 diabetes, hypertension, obesity who presents today for follow up of diabetes. She would like to talk about tobacco abuse and would like to resume her Zoloft.    1) Essential Hypertension:  Currently prescribed HCTZ 25 mg. She switched to [facility] a few weeks ago and is needing this medication transferred over from [place].   BP Readings from Last 3 Encounters:  01/05/21 126/84  08/31/20 136/80  04/06/20 140/80   2) Type 2 Diabetes:  Current medications include: Januvia 100 mg, Glipizide XL 10 mg. She would like to start Ozempic for weight loss.   She cannot tolerate Metformin XR due to GI upset.   She is checking her blood glucose 2-3 times weekly and is getting readings of 270's.   Last A1C: 9.3 in February 2022, 9.1 today Last Eye Exam: Due Last Foot Exam: UTD Pneumonia Vaccination: 2018 Urine Microalbumin: September 2021 Statin: None   Diet currently consists of:  Breakfast: Ham and Cheese croissant - fast food Lunch: Salad - Fast food  Dinner: Meat and vegetables  Snacks: None Desserts: None Beverages: Water, coffee, no calorie soda, unsweet tea  Exercise: Walking three times weekly    3) Tobacco Abuse: Smoker for the last three years, is now up to 1/2 PPD and would like to quit. She's not tried anything OTC. She would like to try Chantix.   4) GAD: Currently prescribed Zoloft 125 mg, has been on 100 mg for the last year but would like to increase up to 125 mg again. She would like to resume the extra 25 mg due to symptoms of irritability and anxiety. She felt well managed on Zoloft 125 mg.     Review of Systems  Eyes:  Negative for visual disturbance.  Respiratory:  Negative for shortness of breath.   Cardiovascular:  Negative for chest pain.  Neurological:   Negative for dizziness and headaches.  Psychiatric/Behavioral:  The patient is nervous/anxious.         Past Medical History:  Diagnosis Date   Anemia    Complication of anesthesia 2002   DURING SCAR TISSUE EXCISION FROM FALLOPIAN TUBES(2002), PT WAS GIVEN GENERAL ANESTHESIA AND PT BEGAN HAVING A REACTION TO THE ANESTHESIA AND LIPS AND TONGUE STARTED SWELLING AND THE SURGERY HAD TO BE STOPPED DUE TO AIRWAY CLOSING UP-PT HAD GA PRIOR TO THIS FOR HER GALLBLADDER WITH NO PROBLEM PREVIOUSLY   Depression    Diabetes mellitus without complication ([place])    Dysrhythmia    H/O TACHYCARDIA. stress and anxiety brings it on   Family history of adverse reaction to anesthesia    [place] [facility]   Hypertension    Irregular periods    Vitamin D deficiency    per patient, no longer a problem    Social History   Socioeconomic History   Marital status: Married    Spouse name: Not on file   Number of children: Not on file   Years of education: Not on file   Highest education level: Not on file  Occupational History   Not on file  Tobacco Use   Smoking status: Former    Packs/day: 0.25    Years: 20.00    Pack years: 5.00    Types: Cigarettes    Quit date: 09/05/2015  Years since quitting: 5.3   Smokeless tobacco: Never  Vaping Use   Vaping Use: Never used  Substance and Sexual Activity   Alcohol use: No    Alcohol/week: 0.0 standard drinks    Comment: occasional glass of wine   Drug use: No   Sexual activity: Not Currently    Birth control/protection: None  Other Topics Concern   Not on file  Social History Narrative   Married.   1 child.   She works as a Designer, industrial/product   Enjoys shopping, traveling.    Social Determinants of Health   Financial Resource Strain: Not on file  Food Insecurity: Not on file  Transportation Needs: Not on file  Physical Activity: Not on file  Stress: Not on file  Social Connections: Not on file  Intimate Partner Violence: Not on file     Past Surgical History:  Procedure Laterality Date   [place] N/A 09/30/2017   Procedure: DILATATION & CURETTAGE/HYSTEROSCOPY;  Surgeon: Defrancesco, Alanda Slim, MD;  Location: [facility] ORS;  Service: Gynecology;  Laterality: N/A;   DILITATION & CURRETTAGE/HYSTROSCOPY WITH NOVASURE ABLATION N/A 09/19/2015   Procedure: DILATATION & CURETTAGE/HYSTEROSCOPY WITH NOVASURE ABLATION;  Surgeon: Brayton Mars, MD;  Location: [facility] ORS;  Service: Gynecology;  Laterality: N/A;   JOINT REPLACEMENT Left    30 years ago. knee   KNEE ARTHROSCOPY Left     Family History  Problem Relation Age of Onset   Cancer Father        throat   Hypertension Mother    Diabetes Paternal Grandmother    Breast cancer Neg Hx     Allergies  Allergen Reactions   Lisinopril Swelling    Lips and face   Losartan Other (See Comments)    Mouth swollen, rash and itching   Wellbutrin [Bupropion] Hives and Swelling    Swelling of mouth   Percocet [Oxycodone-Acetaminophen] Anxiety    Current Outpatient Medications on File Prior to Visit  Medication Sig Dispense Refill   carvedilol (COREG) 12.5 MG tablet TAKE 1 TABLET(12.5 MG) BY MOUTH TWICE DAILY WITH A MEAL for heart rate 180 tablet 3   EPINEPHrine 0.3 mg/0.3 mL IJ SOAJ injection Inject 0.3 mg into the muscle once.     Estradiol-Progesterone (BIJUVA) 1-100 MG CAPS Take 100 mg by mouth daily. 30 capsule 12   glipiZIDE (GLUCOTROL XL) 10 MG 24 hr tablet Take 1 tablet (10 mg total) by mouth daily with breakfast. For diabetes. 90 tablet 3   ibuprofen (ADVIL) 800 MG tablet Take 1 tablet (800 mg total) by mouth every 8 (eight) hours as needed for moderate pain. 30 tablet 0   sertraline (ZOLOFT) 100 MG tablet TAKE 1 TABLET(100 MG) BY MOUTH DAILY 30 tablet 0   sitaGLIPtin (JANUVIA) 100 MG tablet Take 1 tablet (100 mg total) by mouth daily. For diabetes. 90 tablet 3   traZODone (DESYREL) 150 MG tablet TAKE 1  TABLET(150 MG) BY MOUTH AT BEDTIME FOR SLEEP 90 tablet 0   aspirin EC 81 MG tablet Take 81 mg by mouth daily. (Patient not taking: Reported on 01/05/2021)     hydrochlorothiazide (HYDRODIURIL) 25 MG tablet Take 1 tablet (25 mg total) by mouth daily. For blood pressure. (Patient not taking: Reported on 01/05/2021) 90 tablet 3   sertraline (ZOLOFT) 25 MG tablet Take 1 tablet (25 mg total) by mouth daily. Along with 100 mg tablet. (Patient not taking: Reported on 01/05/2021) 90 tablet 2  No current facility-administered medications on file prior to visit.    BP 126/84   Pulse 82   Temp 97.7 F (36.5 C) (Temporal)   Ht 5\' 9"  (1.753 m)   Wt 216 lb (98 kg)   SpO2 96%   BMI 31.90 kg/m  Objective:   Physical Exam Cardiovascular:     Rate and Rhythm: Normal rate and regular rhythm.  Pulmonary:     Effort: Pulmonary effort is normal.     Breath sounds: Normal breath sounds.  Musculoskeletal:     Cervical back: Neck supple.  Skin:    General: Skin is warm and dry.  Psychiatric:        Mood and Affect: Mood normal.          Assessment & Plan:      This visit occurred during the SARS-CoV-2 public health emergency.  Safety protocols were in place, including screening questions prior to the visit, additional usage of staff PPE, and extensive cleaning of exam room while observing appropriate contact time as indicated for disinfecting solutions.

## 2021-02-07 ENCOUNTER — Encounter: Payer: Self-pay | Admitting: *Deleted

## 2021-02-13 ENCOUNTER — Telehealth: Payer: 59

## 2021-02-13 ENCOUNTER — Telehealth: Payer: Self-pay

## 2021-02-13 NOTE — Telephone Encounter (Signed)
Called patient twice to schedule for CC screening. LVM to call office back.

## 2021-02-21 ENCOUNTER — Other Ambulatory Visit: Payer: Self-pay | Admitting: Primary Care

## 2021-02-21 DIAGNOSIS — Z720 Tobacco use: Secondary | ICD-10-CM

## 2021-02-21 DIAGNOSIS — G4700 Insomnia, unspecified: Secondary | ICD-10-CM

## 2021-02-25 ENCOUNTER — Other Ambulatory Visit: Payer: Self-pay | Admitting: Primary Care

## 2021-02-25 DIAGNOSIS — F411 Generalized anxiety disorder: Secondary | ICD-10-CM

## 2021-02-25 NOTE — Telephone Encounter (Signed)
Rx doesn't seem correct. One year supply provided in June 2022. Will refuse refill request.

## 2021-03-16 ENCOUNTER — Other Ambulatory Visit: Payer: Self-pay | Admitting: Primary Care

## 2021-03-16 DIAGNOSIS — F411 Generalized anxiety disorder: Secondary | ICD-10-CM

## 2021-03-20 ENCOUNTER — Other Ambulatory Visit: Payer: Self-pay | Admitting: Primary Care

## 2021-03-20 DIAGNOSIS — G4700 Insomnia, unspecified: Secondary | ICD-10-CM

## 2021-04-07 ENCOUNTER — Telehealth (INDEPENDENT_AMBULATORY_CARE_PROVIDER_SITE_OTHER): Payer: 59 | Admitting: Primary Care

## 2021-04-07 ENCOUNTER — Encounter: Payer: Self-pay | Admitting: Primary Care

## 2021-04-07 ENCOUNTER — Ambulatory Visit: Payer: 59 | Admitting: Primary Care

## 2021-04-07 VITALS — Ht 69.0 in | Wt 203.0 lb

## 2021-04-07 DIAGNOSIS — Z720 Tobacco use: Secondary | ICD-10-CM | POA: Diagnosis not present

## 2021-04-07 DIAGNOSIS — E1165 Type 2 diabetes mellitus with hyperglycemia: Secondary | ICD-10-CM

## 2021-04-07 DIAGNOSIS — I10 Essential (primary) hypertension: Secondary | ICD-10-CM

## 2021-04-07 MED ORDER — VARENICLINE TARTRATE 0.5 MG X 11 & 1 MG X 42 PO MISC: ORAL | 0 refills | Status: DC

## 2021-04-07 NOTE — Assessment & Plan Note (Signed)
Will resend Chantix starter pack. Discussed that she has a continuing pack on file at the pharmacy.

## 2021-04-07 NOTE — Progress Notes (Signed)
Patient ID: Michelle Marshall, female    DOB: [birth year], 55 y.o.   MRN: [id number]  Virtual visit completed through [place], a video enabled telemedicine application. Due to national recommendations of social distancing due to COVID-19, a virtual visit is felt to be most appropriate for this patient at this time. Reviewed limitations, risks, security and privacy concerns of performing a virtual visit and the availability of in person appointments. I also reviewed that there may be a patient responsible charge related to this service. The patient agreed to proceed.   Patient location: home Provider location: [place] at [facility], office Persons participating in this virtual visit: patient, provider   If any vitals were documented, they were collected by patient at home unless specified below.    Ht 5\' 9"  (1.753 m)   Wt 203 lb (92.1 kg)   BMI 29.98 kg/m    CC: Follow up for Diabetes  Subjective:   HPI: Michelle Marshall is a 55 y.o. female with a history of type 2 diabetes, hypertension, hyperlipidemia, tobacco abuse presenting on 04/07/2021 for diabetes follow up.  She is also needing a refill of her Chantix Starter pack. She only took two weeks of the prior starter pack.   Current medications include: Glipizide XL 10 mg, Ozempic 0.5 mg weekly. She is up to the 0.5 mg weekly of Ozempic, has tolerated well. Is needing refills.   She is checking her blood glucose 4 times weekly and is getting readings of:   Fasting AM: mid to high 100's  Last A1C: 9.1 in late June 2022 Last Eye Exam: Due Last Foot Exam: UTD Pneumonia Vaccination: 2018 Urine Microalbumin: Due Statin: None.   Dietary changes since last visit: She is cutting back on breads. She has lost about 10 pounds since our visit.    Exercise: She is walking a few times weekly at the park.           Relevant past medical, surgical, family and social history reviewed and updated as indicated. Interim medical  history since our last visit reviewed. Allergies and medications reviewed and updated. Outpatient Medications Prior to Visit  Medication Sig Dispense Refill   carvedilol (COREG) 12.5 MG tablet TAKE 1 TABLET(12.5 MG) BY MOUTH TWICE DAILY WITH A MEAL for heart rate 180 tablet 3   EPINEPHrine 0.3 mg/0.3 mL IJ SOAJ injection Inject 0.3 mg into the muscle once.     glipiZIDE (GLUCOTROL XL) 10 MG 24 hr tablet Take 1 tablet (10 mg total) by mouth daily with breakfast. For diabetes. 90 tablet 3   hydrochlorothiazide (HYDRODIURIL) 25 MG tablet Take 1 tablet (25 mg total) by mouth daily. For blood pressure. 90 tablet 3   ibuprofen (ADVIL) 800 MG tablet Take 1 tablet (800 mg total) by mouth every 8 (eight) hours as needed for moderate pain. 30 tablet 0   Semaglutide,0.25 or 0.5MG /DOS, (OZEMPIC, 0.25 OR 0.5 MG/DOSE,) 2 MG/1.5ML SOPN Inject 0.25 mg once weekly for four weeks, then increase to 0.5 mg once weekly thereafter for diabetes. (Patient taking differently: 5 mg. 0.5 mg once weekly for diabetes.) 4.5 mL 0   sertraline (ZOLOFT) 100 MG tablet TAKE 1 TABLET(100 MG) BY MOUTH DAILY for anxiety 90 tablet 3   sertraline (ZOLOFT) 25 MG tablet Take 1 tablet (25 mg total) by mouth daily. For anxiety. Take with 100 mg tablet. 90 tablet 3   traZODone (DESYREL) 150 MG tablet TAKE 1 TABLET BY MOUTH EVERY DAY AT BEDTIME FOR SLEEP 90 tablet 0  aspirin EC 81 MG tablet Take 81 mg by mouth daily. (Patient not taking: Reported on 04/07/2021)     Estradiol-Progesterone (BIJUVA) 1-100 MG CAPS Take 100 mg by mouth daily. (Patient not taking: Reported on 04/07/2021) 30 capsule 12   varenicline (CHANTIX CONTINUING MONTH PAK) 1 MG tablet Take 1 tablet (1 mg total) by mouth 2 (two) times daily. (Patient not taking: Reported on 04/07/2021) 60 tablet 0   varenicline (CHANTIX STARTING MONTH PAK) 0.5 MG X 11 & 1 MG X 42 tablet Take one 0.5 mg tablet by mouth once daily for 3 days, then increase to one 0.5 mg tablet twice daily for 4 days,  then increase to one 1 mg tablet twice daily. (Patient not taking: Reported on 04/07/2021) 53 tablet 0   No facility-administered medications prior to visit.     Per HPI unless specifically indicated in ROS section below Review of Systems  Constitutional:  Negative for fatigue.  Respiratory:  Negative for shortness of breath.   Cardiovascular:  Negative for chest pain.  Neurological:  Negative for dizziness.  Objective:  Ht 5\' 9"  (1.753 m)   Wt 203 lb (92.1 kg)   BMI 29.98 kg/m   Wt Readings from Last 3 Encounters:  04/07/21 203 lb (92.1 kg)  01/05/21 216 lb (98 kg)  11/22/20 209 lb (94.8 kg)       Physical exam: Gen: alert, NAD, not ill appearing Pulm: speaks in complete sentences without increased work of breathing Psych: normal mood, normal thought content      Results for orders placed or performed in visit on 01/05/21  POCT glycosylated hemoglobin (Hb A1C)  Result Value Ref Range   Hemoglobin A1C 9.1 (A) 4.0 - 5.6 %   HbA1c POC (<> result, manual entry)     HbA1c, POC (prediabetic range)     HbA1c, POC (controlled diabetic range)     Assessment & Plan:   Problem List Items Addressed This Visit       Cardiovascular and Mediastinum   Hypertension - Primary   Relevant Orders   Lipid panel   Comprehensive metabolic panel     Endocrine   Type 2 diabetes mellitus with hyperglycemia (HCC)    Seems to have improved from glucose readings. A1C pending.  Continue Ozempic 0.5 mg weekly for now, may increase dose. Continue Glipizide XL 10 mg daily.  Commended her on dietary changes!  Urine microalbumin due and pending.  Repeat lipid panel pending. Foot exam UTD. Eye exam due.  Follow up in 3-6 months.      Relevant Orders   Microalbumin / creatinine urine ratio   Hemoglobin A1c     Other   Tobacco abuse    Will resend Chantix starter pack. Discussed that she has a continuing pack on file at the pharmacy.      Relevant Medications   varenicline  (CHANTIX STARTING MONTH PAK) 0.5 MG X 11 & 1 MG X 42 tablet     Meds ordered this encounter  Medications   varenicline (CHANTIX STARTING MONTH PAK) 0.5 MG X 11 & 1 MG X 42 tablet    Sig: Take one 0.5 mg tablet by mouth once daily for 3 days, then increase to one 0.5 mg tablet twice daily for 4 days, then increase to one 1 mg tablet twice daily.    Dispense:  53 tablet    Refill:  0    Order Specific Question:   Supervising Provider    Answer:  BEDSOLE, AMY E [[id number]]   Orders Placed This Encounter  Procedures   Lipid panel    Standing Status:   Future    Standing Expiration Date:   04/07/2022   Microalbumin / creatinine urine ratio    Standing Status:   Future    Standing Expiration Date:   04/07/2022   Hemoglobin A1c    Standing Status:   Future    Standing Expiration Date:   04/07/2022   Comprehensive metabolic panel    Standing Status:   Future    Standing Expiration Date:   04/07/2022    I discussed the assessment and treatment plan with the patient. The patient was provided an opportunity to ask questions and all were answered. The patient agreed with the plan and demonstrated an understanding of the instructions. The patient was advised to call back or seek an in-person evaluation if the symptoms worsen or if the condition fails to improve as anticipated.  Follow up plan:  Have your labs drawn at [facility] next week.  Pick up the Starter pack of Chantix.  I will be in touch!   Pleas Koch, NP

## 2021-04-07 NOTE — Patient Instructions (Signed)
Have your labs drawn at [facility] next week.  Pick up the Starter pack of Chantix.  I will be in touch!

## 2021-04-07 NOTE — Assessment & Plan Note (Addendum)
Seems to have improved from glucose readings. A1C pending.  Continue Ozempic 0.5 mg weekly for now, may increase dose. Continue Glipizide XL 10 mg daily.  Commended her on dietary changes!  Urine microalbumin due and pending.  Repeat lipid panel pending. Foot exam UTD. Eye exam due.  Follow up in 3-6 months.

## 2021-04-10 ENCOUNTER — Other Ambulatory Visit (INDEPENDENT_AMBULATORY_CARE_PROVIDER_SITE_OTHER): Payer: 59

## 2021-04-10 ENCOUNTER — Other Ambulatory Visit: Payer: Self-pay

## 2021-04-10 DIAGNOSIS — I10 Essential (primary) hypertension: Secondary | ICD-10-CM | POA: Diagnosis not present

## 2021-04-10 DIAGNOSIS — E1165 Type 2 diabetes mellitus with hyperglycemia: Secondary | ICD-10-CM | POA: Diagnosis not present

## 2021-04-10 LAB — LIPID PANEL
Cholesterol: 126 mg/dL (ref 0–200)
HDL: 60.1 mg/dL (ref >39.00)
LDL Cholesterol: 35 mg/dL (ref 0–99)
NonHDL: 65.61
Total CHOL/HDL Ratio: 2
Triglycerides: 152 mg/dL — ABNORMAL HIGH (ref 0.0–149.0)
VLDL: 30.4 mg/dL (ref 0.0–40.0)

## 2021-04-10 LAB — COMPREHENSIVE METABOLIC PANEL
ALT: 34 U/L (ref 0–35)
AST: 27 U/L (ref 0–37)
Albumin: 4.3 g/dL (ref 3.5–5.2)
Alkaline Phosphatase: 41 U/L (ref 39–117)
BUN: 15 mg/dL (ref 6–23)
CO2: 26 mEq/L (ref 19–32)
Calcium: 10 mg/dL (ref 8.4–10.5)
Chloride: 99 mEq/L (ref 96–112)
Creatinine, Ser: 0.83 mg/dL (ref 0.40–1.20)
GFR: 80.11 mL/min (ref >60.00)
Glucose, Bld: 159 mg/dL — ABNORMAL HIGH (ref 70–99)
Potassium: 3.2 mEq/L — ABNORMAL LOW (ref 3.5–5.1)
Sodium: 138 mEq/L (ref 135–145)
Total Bilirubin: 0.6 mg/dL (ref 0.2–1.2)
Total Protein: 7.1 g/dL (ref 6.0–8.3)

## 2021-04-10 LAB — MICROALBUMIN / CREATININE URINE RATIO
Creatinine,U: 260.7 mg/dL
Microalb Creat Ratio: 0.9 mg/g (ref 0.0–30.0)
Microalb, Ur: 2.4 mg/dL — ABNORMAL HIGH (ref 0.0–1.9)

## 2021-04-10 LAB — HEMOGLOBIN A1C: Hgb A1c MFr Bld: 7.2 % — ABNORMAL HIGH (ref 4.6–6.5)

## 2021-04-18 ENCOUNTER — Other Ambulatory Visit: Payer: Self-pay | Admitting: Primary Care

## 2021-04-18 DIAGNOSIS — E1165 Type 2 diabetes mellitus with hyperglycemia: Secondary | ICD-10-CM

## 2021-04-18 DIAGNOSIS — E876 Hypokalemia: Secondary | ICD-10-CM

## 2021-04-18 MED ORDER — OZEMPIC (1 MG/DOSE) 2 MG/1.5ML ~~LOC~~ SOPN: 1.0000 mg | PEN_INJECTOR | SUBCUTANEOUS | 1 refills | Status: DC

## 2021-04-18 MED ORDER — POTASSIUM CHLORIDE CRYS ER 10 MEQ PO TBCR: 10.0000 meq | EXTENDED_RELEASE_TABLET | Freq: Every day | ORAL | 1 refills | Status: DC

## 2021-04-19 ENCOUNTER — Telehealth: Payer: Self-pay | Admitting: Primary Care

## 2021-04-19 NOTE — Telephone Encounter (Signed)
Pt returned call . Would like a call back [phone] to discuss medication  concern

## 2021-04-21 NOTE — Telephone Encounter (Signed)
See lab results for documentation.  

## 2021-05-09 ENCOUNTER — Other Ambulatory Visit: Payer: Self-pay | Admitting: Primary Care

## 2021-05-09 DIAGNOSIS — E119 Type 2 diabetes mellitus without complications: Secondary | ICD-10-CM

## 2021-05-15 NOTE — Telephone Encounter (Signed)
Called patient to set up appointment with Anda Kraft left vm

## 2021-05-15 NOTE — Telephone Encounter (Signed)
Please call to make appointment for patient with Michelle Marshall in office for Hernia

## 2021-05-16 ENCOUNTER — Ambulatory Visit: Payer: 59 | Admitting: Primary Care

## 2021-05-17 ENCOUNTER — Other Ambulatory Visit: Payer: Self-pay

## 2021-05-17 ENCOUNTER — Ambulatory Visit: Payer: 59 | Admitting: Primary Care

## 2021-05-17 ENCOUNTER — Ambulatory Visit (INDEPENDENT_AMBULATORY_CARE_PROVIDER_SITE_OTHER)
Admission: RE | Admit: 2021-05-17 | Discharge: 2021-05-17 | Disposition: A | Discharge status: Home / Self Care | Payer: 59 | Source: Ambulatory Visit | Attending: Primary Care | Admitting: Primary Care

## 2021-05-17 ENCOUNTER — Encounter: Payer: Self-pay | Admitting: Primary Care

## 2021-05-17 VITALS — BP 122/70 | HR 114 | Temp 97.1°F | Ht 69.0 in | Wt 206.0 lb

## 2021-05-17 DIAGNOSIS — G8929 Other chronic pain: Secondary | ICD-10-CM | POA: Diagnosis not present

## 2021-05-17 DIAGNOSIS — K429 Umbilical hernia without obstruction or gangrene: Secondary | ICD-10-CM | POA: Diagnosis not present

## 2021-05-17 DIAGNOSIS — M545 Low back pain, unspecified: Secondary | ICD-10-CM | POA: Diagnosis not present

## 2021-05-17 DIAGNOSIS — E876 Hypokalemia: Secondary | ICD-10-CM | POA: Diagnosis not present

## 2021-05-17 LAB — BASIC METABOLIC PANEL
BUN: 20 mg/dL (ref 6–23)
CO2: 27 mEq/L (ref 19–32)
Calcium: 9.6 mg/dL (ref 8.4–10.5)
Chloride: 98 mEq/L (ref 96–112)
Creatinine, Ser: 1.08 mg/dL (ref 0.40–1.20)
GFR: 58.36 mL/min — ABNORMAL LOW (ref >60.00)
Glucose, Bld: 373 mg/dL — ABNORMAL HIGH (ref 70–99)
Potassium: 3.2 mEq/L — ABNORMAL LOW (ref 3.5–5.1)
Sodium: 136 mEq/L (ref 135–145)

## 2021-05-17 NOTE — Progress Notes (Signed)
Subjective:    Patient ID: Michelle Marshall, female    DOB: 01-18-1966, 55 y.o.   MRN: [id number]  HPI  Michelle Marshall is a very pleasant 55 y.o. female with a history of type 2 diabetes, amenorrhea, abnormal uterine bleeding, umbilical hernia, tobacco abuse who presents today to discuss several things.   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 Today she endorses daily compliance, is tolerating well. Is due for repeat potassium level today.   Chronic upper umbilical abdominal pain for the last 6 months, progressing. Diagnosed with umbilical hernia in October 2021, at the time hernia was not causing pain, mostly just noticeable as an abdominal mass. She notices her pain with bending forward, slightly heavy lifting objects.  Along with development of pain, she's noticed an increased size of her hernia.   Chronic back pain which is located to the left lower back. She initially noticed her pain after falling down 15 steps in her home 3-4 years ago. Since then she's noticed intermittent flares of pain, and over the last several months her pain has progressed and is now radiating to her left hip. She denies radiation of pain throughout her left lower extremity, numbness/tingling, bowel/bladder changes. She is taking Motrin every 4 hours during flares with improvement.   Wt Readings from Last 3 Encounters:  05/17/21 206 lb (93.4 kg)  04/07/21 203 lb (92.1 kg)  01/05/21 216 lb (98 kg)   BP Readings from Last 3 Encounters:  05/17/21 122/70  01/05/21 126/84  08/31/20 136/80      Review of Systems  Gastrointestinal:  Positive for abdominal pain. Negative for constipation, diarrhea, nausea and vomiting.  Musculoskeletal:  Positive for back pain.  Neurological:  Negative for weakness and numbness.        Past Medical History:  Diagnosis Date   Anemia    Complication of anesthesia 2002   DURING SCAR TISSUE  EXCISION FROM FALLOPIAN TUBES(2002), PT WAS GIVEN GENERAL ANESTHESIA AND PT BEGAN HAVING A REACTION TO THE ANESTHESIA AND LIPS AND TONGUE STARTED SWELLING AND THE SURGERY HAD TO BE STOPPED DUE TO AIRWAY CLOSING UP-PT HAD GA PRIOR TO THIS FOR HER GALLBLADDER WITH NO PROBLEM PREVIOUSLY   Depression    Diabetes mellitus without complication ([place])    Dysrhythmia    H/O TACHYCARDIA. stress and anxiety brings it on   Family history of adverse reaction to anesthesia    [place] UP   Hypertension    Irregular periods    Vitamin D deficiency    per patient, no longer a problem    Social History   Socioeconomic History   Marital status: Married    Spouse name: Not on file   Number of children: Not on file   Years of education: Not on file   Highest education level: Not on file  Occupational History   Not on file  Tobacco Use   Smoking status: Former    Packs/day: 0.25    Years: 20.00    Pack years: 5.00    Types: Cigarettes    Quit date: 09/05/2015    Years since quitting: 5.7   Smokeless tobacco: Never  Vaping Use   Vaping Use: Never used  Substance and Sexual Activity   Alcohol use: No    Alcohol/week: 0.0 standard drinks    Comment: occasional glass of wine   Drug use: No   Sexual activity: Not Currently    Birth control/protection:  None  Other Topics Concern   Not on file  Social History Narrative   Married.   1 child.   She works as a Designer, industrial/product   Enjoys shopping, traveling.    Social Determinants of Health   Financial Resource Strain: Not on file  Food Insecurity: Not on file  Transportation Needs: Not on file  Physical Activity: Not on file  Stress: Not on file  Social Connections: Not on file  Intimate Partner Violence: Not on file    Past Surgical History:  Procedure Laterality Date   [place] N/A 09/30/2017   Procedure: DILATATION & CURETTAGE/HYSTEROSCOPY;  Surgeon:  Defrancesco, Alanda Slim, MD;  Location: [facility];  Service: Gynecology;  Laterality: N/A;   DILITATION & CURRETTAGE/HYSTROSCOPY WITH NOVASURE ABLATION N/A 09/19/2015   Procedure: DILATATION & CURETTAGE/HYSTEROSCOPY WITH NOVASURE ABLATION;  Surgeon: Brayton Mars, MD;  Location: [facility];  Service: Gynecology;  Laterality: N/A;   JOINT REPLACEMENT Left    30 years ago. knee   KNEE ARTHROSCOPY Left     Family History  Problem Relation Age of Onset   Cancer Father        throat   Hypertension Mother    Diabetes Paternal Grandmother    Breast cancer Neg Hx     Allergies  Allergen Reactions   Lisinopril Swelling    Lips and face   Losartan Other (See Comments)    Mouth swollen, rash and itching   Wellbutrin [Bupropion] Hives and Swelling    Swelling of mouth   Amlodipine Swelling   Percocet [Oxycodone-Acetaminophen] Anxiety    Current Outpatient Medications on File Prior to Visit  Medication Sig Dispense Refill   aspirin EC 81 MG tablet Take 81 mg by mouth daily.     carvedilol (COREG) 12.5 MG tablet TAKE 1 TABLET(12.5 MG) BY MOUTH TWICE DAILY WITH A MEAL for heart rate 180 tablet 3   EPINEPHrine 0.3 mg/0.3 mL IJ SOAJ injection Inject 0.3 mg into the muscle once.     Estradiol-Progesterone (BIJUVA) 1-100 MG CAPS Take 100 mg by mouth daily. 30 capsule 12   glipiZIDE (GLUCOTROL XL) 10 MG 24 hr tablet Take 1 tablet (10 mg total) by mouth daily with breakfast. For diabetes. 90 tablet 3   hydrochlorothiazide (HYDRODIURIL) 25 MG tablet Take 1 tablet (25 mg total) by mouth daily. For blood pressure. 90 tablet 3   ibuprofen (ADVIL) 800 MG tablet Take 1 tablet (800 mg total) by mouth every 8 (eight) hours as needed for moderate pain. 30 tablet 0   potassium chloride (KLOR-CON) 10 MEQ tablet Take 1 tablet (10 mEq total) by mouth daily. 90 tablet 1   Semaglutide, 1 MG/DOSE, (OZEMPIC, 1 MG/DOSE,) 2 MG/1.5ML SOPN Inject 1 mg into the skin once a week. For diabetes. 9 mL 1   sertraline  (ZOLOFT) 100 MG tablet TAKE 1 TABLET(100 MG) BY MOUTH DAILY for anxiety 90 tablet 3   sertraline (ZOLOFT) 25 MG tablet Take 1 tablet (25 mg total) by mouth daily. For anxiety. Take with 100 mg tablet. 90 tablet 3   traZODone (DESYREL) 150 MG tablet TAKE 1 TABLET BY MOUTH EVERY DAY AT BEDTIME FOR SLEEP 90 tablet 0   varenicline (CHANTIX CONTINUING MONTH PAK) 1 MG tablet Take 1 tablet (1 mg total) by mouth 2 (two) times daily. 60 tablet 0   No current facility-administered medications on file prior to visit.    BP 122/70 (BP Location: Left Arm,  Patient Position: Sitting, Cuff Size: Normal)   Pulse (!) 114   Temp (!) 97.1 F (36.2 C) (Temporal)   Ht 5\' 9"  (1.753 m)   Wt 206 lb (93.4 kg)   SpO2 97%   BMI 30.42 kg/m  Objective:   Physical Exam Cardiovascular:     Rate and Rhythm: Normal rate and regular rhythm.  Pulmonary:     Effort: Pulmonary effort is normal.  Abdominal:     Tenderness: There is abdominal tenderness in the periumbilical area.       Comments: Large hernia noted to mid upper umbilical region, mild tenderness with palpation on standing. Obvious bulging.   Musculoskeletal:     Cervical back: Neck supple.     Lumbar back: No bony tenderness. Normal range of motion. Negative right straight leg raise test and negative left straight leg raise test.       Back:     Comments: 5/5 strength to bilateral lower extremities.   Skin:    General: Skin is warm and dry.          Assessment & Plan:      This visit occurred during the SARS-CoV-2 public health emergency.  Safety protocols were in place, including screening questions prior to the visit, additional usage of staff PPE, and extensive cleaning of exam room while observing appropriate contact time as indicated for disinfecting solutions.

## 2021-05-17 NOTE — Assessment & Plan Note (Addendum)
Secondary to HCTZ use. Compliant to potassium 10 meq, continue same. Repeat BMP pending

## 2021-05-17 NOTE — Patient Instructions (Signed)
Stop by the lab and xray prior to leaving today. I will notify you of your results once received.   You will be contacted regarding your referral to physical therapy and general surgery.  Please let us know if you have not been contacted within two weeks.   Let me know if the nausea returns!  It was a pleasure to see you today!

## 2021-05-17 NOTE — Assessment & Plan Note (Signed)
Diagnosed in October 2021, exam today reveals that this hernia has likely grown in size.  Given presentation coupled with symptoms, will refer patient to general surgery for evaluation. She agrees.  She is stable for outpatient treatment.

## 2021-05-17 NOTE — Assessment & Plan Note (Signed)
Post traumatic years ago.  Obtain lumbar plain films today. Referral placed to PT.

## 2021-05-19 ENCOUNTER — Other Ambulatory Visit: Payer: Self-pay | Admitting: Primary Care

## 2021-05-19 DIAGNOSIS — E876 Hypokalemia: Secondary | ICD-10-CM

## 2021-05-19 MED ORDER — POTASSIUM CHLORIDE CRYS ER 20 MEQ PO TBCR: 20.0000 meq | EXTENDED_RELEASE_TABLET | Freq: Two times a day (BID) | ORAL | 0 refills | Status: DC

## 2021-05-25 DIAGNOSIS — M545 Low back pain, unspecified: Secondary | ICD-10-CM

## 2021-05-25 MED ORDER — CYCLOBENZAPRINE HCL 5 MG PO TABS
5.0000 mg | ORAL_TABLET | Freq: Three times a day (TID) | ORAL | 0 refills | Status: DC | PRN
Start: 2021-05-25 — End: 2021-10-03

## 2021-05-31 ENCOUNTER — Encounter: Payer: Self-pay | Admitting: Surgery

## 2021-05-31 ENCOUNTER — Ambulatory Visit: Payer: 59 | Admitting: Surgery

## 2021-05-31 ENCOUNTER — Other Ambulatory Visit: Payer: Self-pay

## 2021-05-31 VITALS — BP 125/87 | HR 93 | Temp 98.3°F | Ht 69.0 in | Wt 204.0 lb

## 2021-05-31 DIAGNOSIS — K432 Incisional hernia without obstruction or gangrene: Secondary | ICD-10-CM

## 2021-05-31 NOTE — Patient Instructions (Addendum)
You have requested for your Umbilical Hernia be repaired. This will be scheduled with Dr. Dahlia Byes at [facility].  Please see your (blue)pre-care sheet for information. Our surgery scheduler will call you to review the surgery date and to go over information.   You will need to arrange to be off work for 1-2 weeks but will have to have a lifting restriction of no more than 15 lbs for 6 weeks following your surgery.   Umbilical Hernia, Adult A hernia is a bulge of tissue that pushes through an opening between muscles. An umbilical hernia happens in the abdomen, near the belly button (umbilicus). The hernia may contain tissues from the small intestine, large intestine, or fatty tissue covering the intestines (omentum). Umbilical hernias in adults tend to get worse over time, and they require surgical treatment. There are several types of umbilical hernias. You may have: A hernia located just above or below the umbilicus (indirect hernia). This is the most common type of umbilical hernia in adults. A hernia that forms through an opening formed by the umbilicus (direct hernia). A hernia that comes and goes (reducible hernia). A reducible hernia may be visible only when you strain, lift something heavy, or cough. This type of hernia can be pushed back into the abdomen (reduced). A hernia that traps abdominal tissue inside the hernia (incarcerated hernia). This type of hernia cannot be reduced. A hernia that cuts off blood flow to the tissues inside the hernia (strangulated hernia). The tissues can start to die if this happens. This type of hernia requires emergency treatment.  What are the causes? An umbilical hernia happens when tissue inside the abdomen presses on a weak area of the abdominal muscles. What increases the risk? You may have a greater risk of this condition if you: Are obese. Have had several pregnancies. Have a buildup of fluid inside your abdomen (ascites). Have had surgery  that weakens the abdominal muscles.  What are the signs or symptoms? The main symptom of this condition is a painless bulge at or near the belly button. A reducible hernia may be visible only when you strain, lift something heavy, or cough. Other symptoms may include: Dull pain. A feeling of pressure.  Symptoms of a strangulated hernia may include: Pain that gets increasingly worse. Nausea and vomiting. Pain when pressing on the hernia. Skin over the hernia becoming red or purple. Constipation. Blood in the stool.  How is this diagnosed? This condition may be diagnosed based on: A physical exam. You may be asked to cough or strain while standing. These actions increase the pressure inside your abdomen and force the hernia through the opening in your muscles. Your health care provider may try to reduce the hernia by pressing on it. Your symptoms and medical history.  How is this treated? Surgery is the only treatment for an umbilical hernia. Surgery for a strangulated hernia is done as soon as possible. If you have a small hernia that is not incarcerated, you may need to lose weight before having surgery. Follow these instructions at home: Lose weight, if told by your health care provider. Do not try to push the hernia back in. Watch your hernia for any changes in color or size. Tell your health care provider if any changes occur. You may need to avoid activities that increase pressure on your hernia. Do not lift anything that is heavier than 10 lb (4.5 kg) until your health care provider says that this is safe. Take over-the-counter and  prescription medicines only as told by your health care provider. Keep all follow-up visits as told by your health care provider. This is important. Contact a health care provider if: Your hernia gets larger. Your hernia becomes painful. Get help right away if: You develop sudden, severe pain near the area of your hernia. You have pain as well as  nausea or vomiting. You have pain and the skin over your hernia changes color. You develop a fever. This information is not intended to replace advice given to you by your health care provider. Make sure you discuss any questions you have with your health care provider. Document Released: 11/25/2015 Document Revised: 02/26/2016 Document Reviewed: 11/25/2015 Elsevier Interactive Patient Education  [employer].

## 2021-06-02 NOTE — Progress Notes (Signed)
Patient ID: Michelle Marshall, female   DOB: 1966/01/29, 55 y.o.   MRN: [id number]  HPI Michelle Marshall is a 55 y.o. female seen in consultation at the request of Ms. Clark NP.  She reports having a bulge within the abdominal wall.  She endorses some intermittent discomfort and mild pain that is dull worsening with certain activities.  No fevers or chills no nausea no vomiting.  She does feel a hard lump.  She is able to perform more than 4 METS of activity without any shortness of breath or chest pain.  She denies any prior hernia surgeries but the admit to having the laparoscopic cholecystectomy in the remote past. She does have a history of diabetes and hypertension. BMP shows high glucose, Hb A1C 7.2. She did have an ultrasound that have personally reviewed showing evidence of abdominal wall hernia.defect 3.2 x2.4 cm  HPI  Past Medical History:  Diagnosis Date   Anemia    Complication of anesthesia 2002   DURING SCAR TISSUE EXCISION FROM FALLOPIAN TUBES(2002), PT WAS GIVEN GENERAL ANESTHESIA AND PT BEGAN HAVING A REACTION TO THE ANESTHESIA AND LIPS AND TONGUE STARTED SWELLING AND THE SURGERY HAD TO BE STOPPED DUE TO AIRWAY CLOSING UP-PT HAD GA PRIOR TO THIS FOR HER GALLBLADDER WITH NO PROBLEM PREVIOUSLY   Depression    Diabetes mellitus without complication ([place])    Dysrhythmia    H/O TACHYCARDIA. stress and anxiety brings it on   Family history of adverse reaction to anesthesia    [place] UP   Hypertension    Irregular periods    Vitamin D deficiency    per patient, no longer a problem    Past Surgical History:  Procedure Laterality Date   CHOLECYSTECTOMY     DILATATION & CURETTAGE/HYSTEROSCOPY WITH MYOSURE N/A 09/30/2017   Procedure: DILATATION & CURETTAGE/HYSTEROSCOPY;  Surgeon: Defrancesco, Alanda Slim, MD;  Location: [facility];  Service: Gynecology;  Laterality: N/A;   DILITATION & CURRETTAGE/HYSTROSCOPY WITH NOVASURE ABLATION N/A 09/19/2015   Procedure:  DILATATION & CURETTAGE/HYSTEROSCOPY WITH NOVASURE ABLATION;  Surgeon: Brayton Mars, MD;  Location: [facility];  Service: Gynecology;  Laterality: N/A;   JOINT REPLACEMENT Left    30 years ago. knee   KNEE ARTHROSCOPY Left     Family History  Problem Relation Age of Onset   Cancer Father        throat   Hypertension Mother    Diabetes Paternal Grandmother    Breast cancer Neg Hx     Social History Social History   Tobacco Use   Smoking status: Some Days    Packs/day: 0.15    Years: 20.00    Pack years: 3.00    Types: Cigarettes    Last attempt to quit: 09/05/2015    Years since quitting: 5.7   Smokeless tobacco: Never  Vaping Use   Vaping Use: Never used  Substance Use Topics   Alcohol use: Yes    Alcohol/week: 1.0 standard drink    Types: 1 Glasses of wine per week    Comment: occasional glass of wine   Drug use: No    Allergies  Allergen Reactions   Lisinopril Swelling    Lips and face   Losartan Other (See Comments)    Mouth swollen, rash and itching   Wellbutrin [Bupropion] Hives and Swelling    Swelling of mouth   Amlodipine Swelling   Percocet [Oxycodone-Acetaminophen] Anxiety    Current Outpatient Medications  Medication Sig Dispense Refill   carvedilol (  COREG) 12.5 MG tablet TAKE 1 TABLET(12.5 MG) BY MOUTH TWICE DAILY WITH A MEAL for heart rate 180 tablet 3   cyclobenzaprine (FLEXERIL) 5 MG tablet Take 1 tablet (5 mg total) by mouth 3 (three) times daily as needed for muscle spasms. 15 tablet 0   EPINEPHrine 0.3 mg/0.3 mL IJ SOAJ injection Inject 0.3 mg into the muscle once.     glipiZIDE (GLUCOTROL XL) 10 MG 24 hr tablet Take 1 tablet (10 mg total) by mouth daily with breakfast. For diabetes. 90 tablet 3   hydrochlorothiazide (HYDRODIURIL) 25 MG tablet Take 1 tablet (25 mg total) by mouth daily. For blood pressure. 90 tablet 3   ibuprofen (ADVIL) 800 MG tablet Take 1 tablet (800 mg total) by mouth every 8 (eight) hours as needed for moderate pain.  30 tablet 0   potassium chloride (KLOR-CON) 10 MEQ tablet Take 1 tablet (10 mEq total) by mouth daily. 90 tablet 1   Semaglutide, 1 MG/DOSE, (OZEMPIC, 1 MG/DOSE,) 2 MG/1.5ML SOPN Inject 1 mg into the skin once a week. For diabetes. 9 mL 1   sertraline (ZOLOFT) 100 MG tablet TAKE 1 TABLET(100 MG) BY MOUTH DAILY for anxiety 90 tablet 3   sertraline (ZOLOFT) 25 MG tablet Take 1 tablet (25 mg total) by mouth daily. For anxiety. Take with 100 mg tablet. 90 tablet 3   traZODone (DESYREL) 150 MG tablet TAKE 1 TABLET BY MOUTH EVERY DAY AT BEDTIME FOR SLEEP 90 tablet 0   varenicline (CHANTIX CONTINUING MONTH PAK) 1 MG tablet Take 1 tablet (1 mg total) by mouth 2 (two) times daily. 60 tablet 0   No current facility-administered medications for this visit.     Review of Systems Full ROS  was asked and was negative except for the information on the HPI  Physical Exam Blood pressure 125/87, pulse 93, temperature 98.3 F (36.8 C), height 5\' 9"  (1.753 m), weight 204 lb (92.5 kg), SpO2 97 %. CONSTITUTIONAL: NAD. EYES: Pupils are equal, round, and , Sclera are non-icteric. EARS, NOSE, MOUTH AND THROAT: She is wearing a mask, Hearing is intact to voice. LYMPH NODES:  Lymph nodes in the neck are normal. RESPIRATORY:  Lungs are clear. There is normal respiratory effort, with equal breath sounds bilaterally, and without pathologic use of accessory muscles. CARDIOVASCULAR: Heart is regular without murmurs, gallops, or rubs. GI: The abdomen is  soft, periumbilical incisional hernia, chronically incarcerated and tender. Measures 2.5 cms. There are no palpable masses. There is no hepatosplenomegaly. There are normal bowel sounds in all quadrants. GU: Rectal deferred.   MUSCULOSKELETAL: Normal muscle strength and tone. No cyanosis or edema.   SKIN: Turgor is good and there are no pathologic skin lesions or ulcers. NEUROLOGIC: Motor and sensation is grossly normal. Cranial nerves are grossly intact. PSYCH:   Oriented to person, place and time. Affect is normal.  Data Reviewed  I have personally reviewed the patient's imaging, laboratory findings and medical records.    Assessment/Plan 55 year old female with symptomatic incisional hernia.  Discussed with the patient in detail about her disease process.  I do recommend repair.  Given that the size is smaller I do think that open repair will be probably similar to the laparoscopic repair.  Both procedures were discussed with the patient and she is interested in open repair.  I do think that we can do this as an outpatient.  Procedure discussed with the patient in detail.  Risks, benefits and possible complications including but not limited to: Bleeding,  infection injury to adjacent organs.  Bowel injuries chronic pain and recurrence.  She understands and wished to proceed Encourage to continue aggressive DM control to improve surgical outcomes Time spent in this encounter  was > 60 minutes, a copy of the report was sent to the referring provider.  Caroleen Hamman, MD FACS General Surgeon 06/02/2021, 11:41 PM

## 2021-06-02 NOTE — H&P (View-Only) (Signed)
Patient ID: Michelle Marshall, female   DOB: [birth year], 55 y.o.   MRN: [id number]  HPI Michelle Marshall is a 55 y.o. female seen in consultation at the request of Ms. Clark NP.  She reports having a bulge within the abdominal wall.  She endorses some intermittent discomfort and mild pain that is dull worsening with certain activities.  No fevers or chills no nausea no vomiting.  She does feel a hard lump.  She is able to perform more than 4 METS of activity without any shortness of breath or chest pain.  She denies any prior hernia surgeries but the admit to having the laparoscopic cholecystectomy in the remote past. She does have a history of diabetes and hypertension. BMP shows high glucose, Hb A1C 7.2. She did have an ultrasound that have personally reviewed showing evidence of abdominal wall hernia.defect 3.2 x2.4 cm  HPI  Past Medical History:  Diagnosis Date   Anemia    Complication of anesthesia 2002   DURING SCAR TISSUE EXCISION FROM FALLOPIAN TUBES(2002), PT WAS GIVEN GENERAL ANESTHESIA AND PT BEGAN HAVING A REACTION TO THE ANESTHESIA AND LIPS AND TONGUE STARTED SWELLING AND THE SURGERY HAD TO BE STOPPED DUE TO AIRWAY CLOSING UP-PT HAD GA PRIOR TO THIS FOR HER GALLBLADDER WITH NO PROBLEM PREVIOUSLY   Depression    Diabetes mellitus without complication ([place])    Dysrhythmia    H/O TACHYCARDIA. stress and anxiety brings it on   Family history of adverse reaction to anesthesia    Ullin UP   Hypertension    Irregular periods    Vitamin D deficiency    per patient, no longer a problem    Past Surgical History:  Procedure Laterality Date   CHOLECYSTECTOMY     DILATATION & CURETTAGE/HYSTEROSCOPY WITH MYOSURE N/A 09/30/2017   Procedure: DILATATION & CURETTAGE/HYSTEROSCOPY;  Surgeon: Defrancesco, Alanda Slim, MD;  Location: [facility] ORS;  Service: Gynecology;  Laterality: N/A;   DILITATION & CURRETTAGE/HYSTROSCOPY WITH NOVASURE ABLATION N/A 09/19/2015   Procedure:  DILATATION & CURETTAGE/HYSTEROSCOPY WITH NOVASURE ABLATION;  Surgeon: Brayton Mars, MD;  Location: [facility] ORS;  Service: Gynecology;  Laterality: N/A;   JOINT REPLACEMENT Left    30 years ago. knee   KNEE ARTHROSCOPY Left     Family History  Problem Relation Age of Onset   Cancer Father        throat   Hypertension Mother    Diabetes Paternal Grandmother    Breast cancer Neg Hx     Social History Social History   Tobacco Use   Smoking status: Some Days    Packs/day: 0.15    Years: 20.00    Pack years: 3.00    Types: Cigarettes    Last attempt to quit: 09/05/2015    Years since quitting: 5.7   Smokeless tobacco: Never  Vaping Use   Vaping Use: Never used  Substance Use Topics   Alcohol use: Yes    Alcohol/week: 1.0 standard drink    Types: 1 Glasses of wine per week    Comment: occasional glass of wine   Drug use: No    Allergies  Allergen Reactions   Lisinopril Swelling    Lips and face   Losartan Other (See Comments)    Mouth swollen, rash and itching   Wellbutrin [Bupropion] Hives and Swelling    Swelling of mouth   Amlodipine Swelling   Percocet [Oxycodone-Acetaminophen] Anxiety    Current Outpatient Medications  Medication Sig Dispense Refill   carvedilol (  COREG) 12.5 MG tablet TAKE 1 TABLET(12.5 MG) BY MOUTH TWICE DAILY WITH A MEAL for heart rate 180 tablet 3   cyclobenzaprine (FLEXERIL) 5 MG tablet Take 1 tablet (5 mg total) by mouth 3 (three) times daily as needed for muscle spasms. 15 tablet 0   EPINEPHrine 0.3 mg/0.3 mL IJ SOAJ injection Inject 0.3 mg into the muscle once.     glipiZIDE (GLUCOTROL XL) 10 MG 24 hr tablet Take 1 tablet (10 mg total) by mouth daily with breakfast. For diabetes. 90 tablet 3   hydrochlorothiazide (HYDRODIURIL) 25 MG tablet Take 1 tablet (25 mg total) by mouth daily. For blood pressure. 90 tablet 3   ibuprofen (ADVIL) 800 MG tablet Take 1 tablet (800 mg total) by mouth every 8 (eight) hours as needed for moderate pain.  30 tablet 0   potassium chloride (KLOR-CON) 10 MEQ tablet Take 1 tablet (10 mEq total) by mouth daily. 90 tablet 1   Semaglutide, 1 MG/DOSE, (OZEMPIC, 1 MG/DOSE,) 2 MG/1.5ML SOPN Inject 1 mg into the skin once a week. For diabetes. 9 mL 1   sertraline (ZOLOFT) 100 MG tablet TAKE 1 TABLET(100 MG) BY MOUTH DAILY for anxiety 90 tablet 3   sertraline (ZOLOFT) 25 MG tablet Take 1 tablet (25 mg total) by mouth daily. For anxiety. Take with 100 mg tablet. 90 tablet 3   traZODone (DESYREL) 150 MG tablet TAKE 1 TABLET BY MOUTH EVERY DAY AT BEDTIME FOR SLEEP 90 tablet 0   varenicline (CHANTIX CONTINUING MONTH PAK) 1 MG tablet Take 1 tablet (1 mg total) by mouth 2 (two) times daily. 60 tablet 0   No current facility-administered medications for this visit.     Review of Systems Full ROS  was asked and was negative except for the information on the HPI  Physical Exam Blood pressure 125/87, pulse 93, temperature 98.3 F (36.8 C), height 5\' 9"  (1.753 m), weight 204 lb (92.5 kg), SpO2 97 %. CONSTITUTIONAL: NAD. EYES: Pupils are equal, round, and , Sclera are non-icteric. EARS, NOSE, MOUTH AND THROAT: She is wearing a mask, Hearing is intact to voice. LYMPH NODES:  Lymph nodes in the neck are normal. RESPIRATORY:  Lungs are clear. There is normal respiratory effort, with equal breath sounds bilaterally, and without pathologic use of accessory muscles. CARDIOVASCULAR: Heart is regular without murmurs, gallops, or rubs. GI: The abdomen is  soft, periumbilical incisional hernia, chronically incarcerated and tender. Measures 2.5 cms. There are no palpable masses. There is no hepatosplenomegaly. There are normal bowel sounds in all quadrants. GU: Rectal deferred.   MUSCULOSKELETAL: Normal muscle strength and tone. No cyanosis or edema.   SKIN: Turgor is good and there are no pathologic skin lesions or ulcers. NEUROLOGIC: Motor and sensation is grossly normal. Cranial nerves are grossly intact. PSYCH:   Oriented to person, place and time. Affect is normal.  Data Reviewed  I have personally reviewed the patient's imaging, laboratory findings and medical records.    Assessment/Plan 55 year old female with symptomatic incisional hernia.  Discussed with the patient in detail about her disease process.  I do recommend repair.  Given that the size is smaller I do think that open repair will be probably similar to the laparoscopic repair.  Both procedures were discussed with the patient and she is interested in open repair.  I do think that we can do this as an outpatient.  Procedure discussed with the patient in detail.  Risks, benefits and possible complications including but not limited to: Bleeding,  infection injury to adjacent organs.  Bowel injuries chronic pain and recurrence.  She understands and wished to proceed Encourage to continue aggressive DM control to improve surgical outcomes Time spent in this encounter  was > 60 minutes, a copy of the report was sent to the referring provider.  Caroleen Hamman, MD FACS General Surgeon 06/02/2021, 11:41 PM

## 2021-06-05 ENCOUNTER — Telehealth: Payer: Self-pay | Admitting: Surgery

## 2021-06-05 NOTE — Telephone Encounter (Signed)
Patient returns call, she is now informed of all dates regarding her surgery and verbalized understanding.

## 2021-06-05 NOTE — Telephone Encounter (Signed)
Outgoing call is made, left message for patient to call.  Please inform patient of the following:  Pre-Admission date/time, COVID Testing date and Surgery date.  Surgery Date: 06/08/21 Preadmission Testing Date: 06/06/21 (phone 8a-1p) Covid Testing Date: Not needed.     Also patient will need to call at [phone], between 1-3:00pm the day before surgery, to find out what time to arrive for surgery.

## 2021-06-06 ENCOUNTER — Other Ambulatory Visit
Admission: RE | Admit: 2021-06-06 | Discharge: 2021-06-06 | Disposition: A | Discharge status: Home / Self Care | Payer: 59 | Source: Ambulatory Visit | Attending: Surgery | Admitting: Surgery

## 2021-06-06 ENCOUNTER — Encounter: Payer: Self-pay | Admitting: Urgent Care

## 2021-06-06 ENCOUNTER — Other Ambulatory Visit: Payer: Self-pay

## 2021-06-06 DIAGNOSIS — Z01818 Encounter for other preprocedural examination: Secondary | ICD-10-CM | POA: Diagnosis present

## 2021-06-06 DIAGNOSIS — I10 Essential (primary) hypertension: Secondary | ICD-10-CM | POA: Diagnosis not present

## 2021-06-06 DIAGNOSIS — Z01810 Encounter for preprocedural cardiovascular examination: Secondary | ICD-10-CM | POA: Diagnosis not present

## 2021-06-06 DIAGNOSIS — E876 Hypokalemia: Secondary | ICD-10-CM | POA: Insufficient documentation

## 2021-06-06 HISTORY — DX: Unspecified osteoarthritis, unspecified site: M19.90

## 2021-06-06 LAB — CBC
HCT: 42.3 % (ref 36.0–46.0)
Hemoglobin: 14.8 g/dL (ref 12.0–15.0)
MCH: 32.6 pg (ref 26.0–34.0)
MCHC: 35 g/dL (ref 30.0–36.0)
MCV: 93.2 fL (ref 80.0–100.0)
Platelets: 354 10*3/uL (ref 150–400)
RBC: 4.54 MIL/uL (ref 3.87–5.11)
RDW: 11.9 % (ref 11.5–15.5)
WBC: 16.5 10*3/uL — ABNORMAL HIGH (ref 4.0–10.5)
nRBC: 0 % (ref 0.0–0.2)

## 2021-06-06 LAB — POTASSIUM: Potassium: 2.9 mmol/L — ABNORMAL LOW (ref 3.5–5.1)

## 2021-06-06 NOTE — Patient Instructions (Addendum)
Your procedure is scheduled on: 06/08/21 - Thursday Report to the Registration Desk on the 1st floor of the [facility]. To find out your arrival time, please call [phone] between 1PM - 3PM on: 06/07/21 - Wednesday  REMEMBER: Instructions that are not followed completely may result in serious medical risk, up to and including death; or upon the discretion of your surgeon and anesthesiologist your surgery may need to be rescheduled.  Do not eat food after midnight the night before surgery.  No gum chewing, lozengers or hard candies.  You may however, drink CLEAR liquids up to 2 hours before you are scheduled to arrive for your surgery. Do not drink anything within 2 hours of your scheduled arrival time.  Clear liquids include: - water  Type 1 and Type 2 diabetics should only drink water.  TAKE THESE MEDICATIONS THE MORNING OF SURGERY WITH A SIP OF WATER:  - carvedilol (COREG) 12.5 MG tablet - varenicline (CHANTIX CONTINUING MONTH PAK) 1 MG tablet   One week prior to surgery: Stop Anti-inflammatories (NSAIDS) such as Advil, Aleve, Ibuprofen, Motrin, Naproxen, Naprosyn and Aspirin based products such as Excedrin, Goodys Powder, BC Powder.  Stop ANY OVER THE COUNTER supplements until after surgery.  You may however, continue to take Tylenol if needed for pain up until the day of surgery.  No Alcohol for 24 hours before or after surgery.  No Smoking including e-cigarettes for 24 hours prior to surgery.  No chewable tobacco products for at least 6 hours prior to surgery.  No nicotine patches on the day of surgery.  Do not use any "recreational" drugs for at least a week prior to your surgery.  Please be advised that the combination of cocaine and anesthesia may have negative outcomes, up to and including death. If you test positive for cocaine, your surgery will be cancelled.  On the morning of surgery brush your teeth with toothpaste and water, you may rinse your mouth with  mouthwash if you wish. Do not swallow any toothpaste or mouthwash.  Use CHG Soap or wipes as directed on instruction sheet.  Do not wear jewelry, make-up, hairpins, clips or nail polish.  Do not wear lotions, powders, or perfumes.   Do not shave body from the neck down 48 hours prior to surgery just in case you cut yourself which could leave a site for infection.  Also, freshly shaved skin may become irritated if using the CHG soap.  Contact lenses, hearing aids and dentures may not be worn into surgery.  Do not bring valuables to the hospital. [facility] is not responsible for any missing/lost belongings or valuables.   Notify your doctor if there is any change in your medical condition (cold, fever, infection).  Wear comfortable clothing (specific to your surgery type) to the hospital.  After surgery, you can help prevent lung complications by doing breathing exercises.  Take deep breaths and cough every 1-2 hours. Your doctor may order a device called an Incentive Spirometer to help you take deep breaths. When coughing or sneezing, hold a pillow firmly against your incision with both hands. This is called "splinting." Doing this helps protect your incision. It also decreases belly discomfort.  If you are being admitted to the hospital overnight, leave your suitcase in the car. After surgery it may be brought to your room.  If you are being discharged the day of surgery, you will not be allowed to drive home. You will need a responsible adult (18 years or  older) to drive you home and stay with you that night.   If you are taking public transportation, you will need to have a responsible adult (18 years or older) with you. Please confirm with your physician that it is acceptable to use public transportation.   Please call the [facility] Dept. at [phone] if you have any questions about these instructions.  Surgery Visitation Policy:  Patients undergoing a  surgery or procedure may have one family member or support person with them as long as that person is not COVID-19 positive or experiencing its symptoms.  That person may remain in the waiting area during the procedure and may rotate out with other people.  Inpatient Visitation:    Visiting hours are 7 a.m. to 8 p.m. Up to two visitors ages 16+ are allowed at one time in a patient room. The visitors may rotate out with other people during the day. Visitors must check out when they leave, or other visitors will not be allowed. One designated support person may remain overnight. The visitor must pass COVID-19 screenings, use hand sanitizer when entering and exiting the patient's room and wear a mask at all times, including in the patient's room. Patients must also wear a mask when staff or their visitor are in the room. Masking is required regardless of vaccination status.

## 2021-06-06 NOTE — Progress Notes (Signed)
  [facility] Perioperative Services: Pre-Admission/Anesthesia Testing  Abnormal Lab Notification   Date: 06/06/21  Name: Michelle Marshall MRN:   [id number]  Re: Abnormal labs noted during PAT appointment   Notified:  Provider Name Provider Role Notification Mode  Caroleen Hamman, MD General Surgery Routed and/or faxed via [place] and Notes:  ABNORMAL LAB VALUE(S): Lab Results  Component Value Date   K 2.9 (L) 06/06/2021   Michelle Marshall is scheduled for an elective [place] on 06/08/2021. In review of her medication reconciliation, it is noted that the patient is taking prescribed diuretic medications (HCTZ 25 mg). Additionally, she is also taking Klor-Con 10 mEq daily. Please note, in efforts to promote a safe and effective anesthetic course, per current guidelines/standards set by the [facility] anesthesia team, the minimal acceptable K+ level for the patient to proceed with general anesthesia is 3.0 mmol/L. Abnormal result is being forwarded to primary attending surgeon for review and consideration of optimization. Order placed to have K+ rechecked on the day of her procedure to ensure correction of the noted derangement.    This is a Community education officer; no formal response is required.  Honor Loh, MSN, APRN, FNP-C, CEN [facility]  Peri-operative Services Nurse Practitioner Phone: [phone] Fax: [phone] 06/06/21 1:11 PM

## 2021-06-07 ENCOUNTER — Other Ambulatory Visit: Payer: Self-pay

## 2021-06-07 ENCOUNTER — Telehealth: Payer: Self-pay

## 2021-06-07 MED ORDER — POTASSIUM CHLORIDE CRYS ER 20 MEQ PO TBCR: 40.0000 meq | EXTENDED_RELEASE_TABLET | Freq: Two times a day (BID) | ORAL | 0 refills | Status: DC

## 2021-06-07 NOTE — Telephone Encounter (Signed)
Patient notified that her potassium was very low. We have sent her in Potassium 40 meq to be taken twice a day for 7 days. She will start this today and be sure to get in both doses. She is aware not to take her Potassium 10 meq while on this regimen.

## 2021-06-07 NOTE — Telephone Encounter (Signed)
-----   Message from Jules Husbands, MD sent at 06/06/2021  6:09 PM EST ----- Please call her kcl 40 Meq BID x 7 days ----- Message ----- From: Interface, Lab In [place] Sent: 06/06/2021  12:50 PM EST To: Jules Husbands, MD

## 2021-06-08 ENCOUNTER — Ambulatory Visit
Admission: RE | Admit: 2021-06-08 | Discharge: 2021-06-08 | Disposition: A | Discharge status: Home / Self Care | Payer: 59 | Attending: Surgery | Admitting: Surgery

## 2021-06-08 ENCOUNTER — Encounter
Admission: RE | Disposition: A | Discharge status: Home / Self Care | Payer: Self-pay | Source: Home / Self Care | Attending: Surgery

## 2021-06-08 ENCOUNTER — Ambulatory Visit: Payer: 59 | Admitting: Certified Registered"

## 2021-06-08 ENCOUNTER — Encounter: Payer: Self-pay | Admitting: Surgery

## 2021-06-08 ENCOUNTER — Other Ambulatory Visit: Payer: Self-pay

## 2021-06-08 DIAGNOSIS — F17210 Nicotine dependence, cigarettes, uncomplicated: Secondary | ICD-10-CM | POA: Insufficient documentation

## 2021-06-08 DIAGNOSIS — I10 Essential (primary) hypertension: Secondary | ICD-10-CM | POA: Insufficient documentation

## 2021-06-08 DIAGNOSIS — E119 Type 2 diabetes mellitus without complications: Secondary | ICD-10-CM | POA: Diagnosis not present

## 2021-06-08 DIAGNOSIS — K432 Incisional hernia without obstruction or gangrene: Secondary | ICD-10-CM | POA: Diagnosis present

## 2021-06-08 DIAGNOSIS — K430 Incisional hernia with obstruction, without gangrene: Secondary | ICD-10-CM | POA: Diagnosis not present

## 2021-06-08 DIAGNOSIS — D649 Anemia, unspecified: Secondary | ICD-10-CM | POA: Diagnosis not present

## 2021-06-08 HISTORY — PX: VENTRAL HERNIA REPAIR: SHX424

## 2021-06-08 HISTORY — PX: INSERTION OF MESH: SHX5868

## 2021-06-08 LAB — POCT I-STAT, CHEM 8
BUN: 12 mg/dL (ref 6–20)
Calcium, Ion: 1.16 mmol/L (ref 1.15–1.40)
Chloride: 102 mmol/L (ref 98–111)
Creatinine, Ser: 0.7 mg/dL (ref 0.44–1.00)
Glucose, Bld: 187 mg/dL — ABNORMAL HIGH (ref 70–99)
HCT: 43 % (ref 36.0–46.0)
Hemoglobin: 14.6 g/dL (ref 12.0–15.0)
Potassium: 3.3 mmol/L — ABNORMAL LOW (ref 3.5–5.1)
Sodium: 140 mmol/L (ref 135–145)
TCO2: 25 mmol/L (ref 22–32)

## 2021-06-08 LAB — GLUCOSE, CAPILLARY: Glucose-Capillary: 173 mg/dL — ABNORMAL HIGH (ref 70–99)

## 2021-06-08 SURGERY — REPAIR, HERNIA, VENTRAL
Anesthesia: General

## 2021-06-08 MED ORDER — LIDOCAINE HCL (CARDIAC) PF 100 MG/5ML IV SOSY
PREFILLED_SYRINGE | INTRAVENOUS | Status: DC | PRN
  Administered 2021-06-08: 100 mg via INTRAVENOUS

## 2021-06-08 MED ORDER — CHLORHEXIDINE GLUCONATE 0.12 % MT SOLN
OROMUCOSAL | Status: AC
  Administered 2021-06-08: 15 mL via OROMUCOSAL
  Filled 2021-06-08: qty 15

## 2021-06-08 MED ORDER — GABAPENTIN 300 MG PO CAPS
ORAL_CAPSULE | ORAL | Status: AC
  Administered 2021-06-08: 300 mg via ORAL
  Filled 2021-06-08: qty 1

## 2021-06-08 MED ORDER — FENTANYL CITRATE (PF) 100 MCG/2ML IJ SOLN
25.0000 ug | INTRAMUSCULAR | Status: DC | PRN
  Administered 2021-06-08 (×4): 25 ug via INTRAVENOUS

## 2021-06-08 MED ORDER — DEXAMETHASONE SODIUM PHOSPHATE 10 MG/ML IJ SOLN
INTRAMUSCULAR | Status: DC | PRN
  Administered 2021-06-08: 5 mg via INTRAVENOUS

## 2021-06-08 MED ORDER — CARVEDILOL 12.5 MG PO TABS
ORAL_TABLET | ORAL | Status: AC
  Filled 2021-06-08: qty 1

## 2021-06-08 MED ORDER — SODIUM CHLORIDE 0.9 % IV SOLN: INTRAVENOUS | Status: DC

## 2021-06-08 MED ORDER — ORAL CARE MOUTH RINSE: 15.0000 mL | Freq: Once | OROMUCOSAL | Status: AC

## 2021-06-08 MED ORDER — CEFAZOLIN SODIUM-DEXTROSE 2-4 GM/100ML-% IV SOLN
INTRAVENOUS | Status: AC
  Filled 2021-06-08: qty 100

## 2021-06-08 MED ORDER — MIDAZOLAM HCL 2 MG/2ML IJ SOLN
INTRAMUSCULAR | Status: AC
  Filled 2021-06-08: qty 2

## 2021-06-08 MED ORDER — ROCURONIUM BROMIDE 100 MG/10ML IV SOLN
INTRAVENOUS | Status: DC | PRN
  Administered 2021-06-08: 50 mg via INTRAVENOUS

## 2021-06-08 MED ORDER — FAMOTIDINE 20 MG PO TABS: 20.0000 mg | ORAL_TABLET | Freq: Once | ORAL | Status: AC

## 2021-06-08 MED ORDER — BUPIVACAINE LIPOSOME 1.3 % IJ SUSP
INTRAMUSCULAR | Status: DC | PRN
  Administered 2021-06-08: 20 mL

## 2021-06-08 MED ORDER — FENTANYL CITRATE (PF) 100 MCG/2ML IJ SOLN
INTRAMUSCULAR | Status: DC | PRN
  Administered 2021-06-08 (×2): 50 ug via INTRAVENOUS

## 2021-06-08 MED ORDER — ACETAMINOPHEN 500 MG PO TABS
ORAL_TABLET | ORAL | Status: AC
  Administered 2021-06-08: 1000 mg via ORAL
  Filled 2021-06-08: qty 2

## 2021-06-08 MED ORDER — PROPOFOL 10 MG/ML IV BOLUS
INTRAVENOUS | Status: AC
  Filled 2021-06-08: qty 40

## 2021-06-08 MED ORDER — ONDANSETRON HCL 4 MG/2ML IJ SOLN
INTRAMUSCULAR | Status: DC | PRN
  Administered 2021-06-08: 4 mg via INTRAVENOUS

## 2021-06-08 MED ORDER — MIDAZOLAM HCL 2 MG/2ML IJ SOLN
INTRAMUSCULAR | Status: DC | PRN
  Administered 2021-06-08: 2 mg via INTRAVENOUS

## 2021-06-08 MED ORDER — CARVEDILOL 12.5 MG PO TABS
12.5000 mg | ORAL_TABLET | Freq: Once | ORAL | Status: AC
  Administered 2021-06-08: 12.5 mg via ORAL

## 2021-06-08 MED ORDER — FENTANYL CITRATE (PF) 100 MCG/2ML IJ SOLN
INTRAMUSCULAR | Status: AC
  Filled 2021-06-08: qty 2

## 2021-06-08 MED ORDER — BUPIVACAINE-EPINEPHRINE (PF) 0.25% -1:200000 IJ SOLN
INTRAMUSCULAR | Status: DC | PRN
  Administered 2021-06-08: 30 mL

## 2021-06-08 MED ORDER — CHLORHEXIDINE GLUCONATE CLOTH 2 % EX PADS: 6.0000 | MEDICATED_PAD | Freq: Once | CUTANEOUS | Status: DC

## 2021-06-08 MED ORDER — SUGAMMADEX SODIUM 200 MG/2ML IV SOLN
INTRAVENOUS | Status: DC | PRN
  Administered 2021-06-08: 200 mg via INTRAVENOUS

## 2021-06-08 MED ORDER — PHENYLEPHRINE HCL (PRESSORS) 10 MG/ML IV SOLN
INTRAVENOUS | Status: DC | PRN
  Administered 2021-06-08: 80 ug via INTRAVENOUS

## 2021-06-08 MED ORDER — CELECOXIB 200 MG PO CAPS
ORAL_CAPSULE | ORAL | Status: AC
  Administered 2021-06-08: 200 mg via ORAL
  Filled 2021-06-08: qty 1

## 2021-06-08 MED ORDER — GABAPENTIN 300 MG PO CAPS: 300.0000 mg | ORAL_CAPSULE | ORAL | Status: AC

## 2021-06-08 MED ORDER — FAMOTIDINE 20 MG PO TABS
ORAL_TABLET | ORAL | Status: AC
  Administered 2021-06-08: 20 mg via ORAL
  Filled 2021-06-08: qty 1

## 2021-06-08 MED ORDER — PROPOFOL 10 MG/ML IV BOLUS
INTRAVENOUS | Status: DC | PRN
  Administered 2021-06-08: 150 mg via INTRAVENOUS

## 2021-06-08 MED ORDER — CEFAZOLIN SODIUM-DEXTROSE 2-4 GM/100ML-% IV SOLN
2.0000 g | INTRAVENOUS | Status: AC
  Administered 2021-06-08: 2 g via INTRAVENOUS

## 2021-06-08 MED ORDER — CHLORHEXIDINE GLUCONATE 0.12 % MT SOLN: 15.0000 mL | Freq: Once | OROMUCOSAL | Status: AC

## 2021-06-08 MED ORDER — CELECOXIB 200 MG PO CAPS: 200.0000 mg | ORAL_CAPSULE | ORAL | Status: AC

## 2021-06-08 MED ORDER — HYDROCODONE-ACETAMINOPHEN 5-325 MG PO TABS: 1.0000 | ORAL_TABLET | Freq: Four times a day (QID) | ORAL | 0 refills | Status: DC | PRN

## 2021-06-08 MED ORDER — FENTANYL CITRATE (PF) 100 MCG/2ML IJ SOLN
INTRAMUSCULAR | Status: AC
  Administered 2021-06-08: 25 ug via INTRAVENOUS
  Filled 2021-06-08: qty 2

## 2021-06-08 MED ORDER — DIPHENHYDRAMINE HCL 50 MG/ML IJ SOLN
INTRAMUSCULAR | Status: DC | PRN
  Administered 2021-06-08: 25 mg via INTRAVENOUS

## 2021-06-08 MED ORDER — BUPIVACAINE LIPOSOME 1.3 % IJ SUSP
INTRAMUSCULAR | Status: AC
  Filled 2021-06-08: qty 20

## 2021-06-08 MED ORDER — BUPIVACAINE-EPINEPHRINE (PF) 0.25% -1:200000 IJ SOLN
INTRAMUSCULAR | Status: AC
  Filled 2021-06-08: qty 30

## 2021-06-08 MED ORDER — ONDANSETRON HCL 4 MG/2ML IJ SOLN: 4.0000 mg | Freq: Once | INTRAMUSCULAR | Status: DC | PRN

## 2021-06-08 MED ORDER — ACETAMINOPHEN 500 MG PO TABS: 1000.0000 mg | ORAL_TABLET | ORAL | Status: AC

## 2021-06-08 MED ORDER — BUPIVACAINE HCL (PF) 0.25 % IJ SOLN
INTRAMUSCULAR | Status: AC
  Filled 2021-06-08: qty 30

## 2021-06-08 SURGICAL SUPPLY — 36 items
APL PRP STRL LF DISP 70% ISPRP (MISCELLANEOUS) ×2
APPLIER CLIP 11 MED OPEN (CLIP)
APPLIER CLIP 13 LRG OPEN (CLIP)
APR CLP LRG 13 20 CLIP (CLIP)
APR CLP MED 11 20 MLT OPN (CLIP)
BLADE CLIPPER SURG (BLADE) ×2 IMPLANT
CHLORAPREP W/TINT 26 (MISCELLANEOUS) ×3 IMPLANT
CLIP APPLIE 11 MED OPEN (CLIP) ×2 IMPLANT
CLIP APPLIE 13 LRG OPEN (CLIP) ×2 IMPLANT
DRAPE LAPAROTOMY 100X77 ABD (DRAPES) ×3 IMPLANT
DRSG TELFA 3X8 NADH (GAUZE/BANDAGES/DRESSINGS) IMPLANT
ELECT CAUTERY BLADE 6.4 (BLADE) ×3 IMPLANT
ELECT REM PT RETURN 9FT ADLT (ELECTROSURGICAL) ×3
ELECTRODE REM PT RTRN 9FT ADLT (ELECTROSURGICAL) ×2 IMPLANT
GAUZE 4X4 16PLY ~~LOC~~+RFID DBL (SPONGE) ×3 IMPLANT
GAUZE SPONGE 4X4 12PLY STRL (GAUZE/BANDAGES/DRESSINGS) ×3 IMPLANT
GLOVE SURG ENC MOIS LTX SZ7 (GLOVE) ×2 IMPLANT
GOWN STRL REUS W/ TWL LRG LVL3 (GOWN DISPOSABLE) ×4 IMPLANT
GOWN STRL REUS W/TWL LRG LVL3 (GOWN DISPOSABLE) ×9
MANIFOLD NEPTUNE II (INSTRUMENTS) ×3 IMPLANT
MESH VENTRALEX ST 8CM LRG (Mesh General) ×1 IMPLANT
NDL HYPO 25X1 1.5 SAFETY (NEEDLE) ×2 IMPLANT
NEEDLE HYPO 22GX1.5 SAFETY (NEEDLE) ×3 IMPLANT
NEEDLE HYPO 25X1 1.5 SAFETY (NEEDLE) ×3 IMPLANT
PACK BASIN MINOR ARMC (MISCELLANEOUS) ×3 IMPLANT
PAD DRESSING TELFA 3X8 NADH (GAUZE/BANDAGES/DRESSINGS) ×2 IMPLANT
SPONGE T-LAP 18X18 ~~LOC~~+RFID (SPONGE) ×3 IMPLANT
STAPLER SKIN PROX 35W (STAPLE) ×2 IMPLANT
SUT ETHIBOND 0 MO6 C/R (SUTURE) ×5 IMPLANT
SUT MNCRL AB 4-0 PS2 18 (SUTURE) ×1 IMPLANT
SUT VIC AB 2-0 SH 27 (SUTURE) ×12
SUT VIC AB 2-0 SH 27XBRD (SUTURE) ×4 IMPLANT
SYR 20ML LL LF (SYRINGE) ×3 IMPLANT
TAPE MICROFOAM 4IN (TAPE) ×2 IMPLANT
WATER STERILE IRR 1000ML POUR (IV SOLUTION) ×2 IMPLANT
WATER STERILE IRR 500ML POUR (IV SOLUTION) ×2 IMPLANT

## 2021-06-08 NOTE — Interval H&P Note (Signed)
History and Physical Interval Note:  06/08/2021 10:07 AM  Michelle Marshall  has presented today for surgery, with the diagnosis of incisional hernia.  The various methods of treatment have been discussed with the patient and family. After consideration of risks, benefits and other options for treatment, the patient has consented to  Procedure(s): HERNIA REPAIR VENTRAL ADULT, incisional (N/A) as a surgical intervention.  The patient's history has been reviewed, patient examined, no change in status, stable for surgery.  I have reviewed the patient's chart and labs.  Questions were answered to the patient's satisfaction.     [place]

## 2021-06-08 NOTE — Anesthesia Procedure Notes (Signed)
Procedure Name: Intubation Date/Time: 06/08/2021 12:00 PM Performed by: Chanetta Marshall, CRNA Pre-anesthesia Checklist: Patient identified, Emergency Drugs available, Suction available and Patient being monitored Patient Re-evaluated:Patient Re-evaluated prior to induction Oxygen Delivery Method: Circle system utilized Preoxygenation: Pre-oxygenation with 100% oxygen Induction Type: IV induction Ventilation: Mask ventilation without difficulty Laryngoscope Size: McGraph and 3 Grade View: Grade II Tube type: Oral Tube size: 7.0 mm Number of attempts: 1 Airway Equipment and Method: Oral airway and Video-laryngoscopy Placement Confirmation: ETT inserted through vocal cords under direct vision, positive ETCO2, breath sounds checked- equal and bilateral and CO2 detector Secured at: 21 cm Tube secured with: Tape Dental Injury: Teeth and Oropharynx as per pre-operative assessment

## 2021-06-08 NOTE — Op Note (Signed)
Incisional Hernia Repair with mesh 8 cm Ventralex BARD  Pre-operative Diagnosis: Incisional hernia  Post-operative Diagnosis: same  Surgeon: Caroleen Hamman, MD FACS  Anesthesia: Gen. with endotracheal tube  Findings: Chronically incarcerated omentum Incarcerated incisional hernia supraumbilical measuring 3.5 cm  Estimated Blood Loss: 10cc         Specimens: sac and omentum          Complications: none              Procedure Details  The patient was seen again in the Holding Room. The benefits, complications, treatment options, and expected outcomes were discussed with the patient. The risks of bleeding, infection, recurrence of symptoms, failure to resolve symptoms, bowel injury, mesh placement, mesh infection, any of which could require further surgery were reviewed with the patient. The likelihood of improving the patient's symptoms with return to their baseline status is good.  The patient and/or family concurred with the proposed plan, giving informed consent.  The patient was taken to Operating Room, identified as Tharon Kitch and the procedure verified.  A Time Out was held and the above information confirmed.  Prior to the induction of general anesthesia, antibiotic prophylaxis was administered. VTE prophylaxis was in place. General endotracheal anesthesia was then administered and tolerated well. After the induction, the abdomen was prepped with Chloraprep and draped in the sterile fashion. The patient was positioned in the supine position.  Incision was created with a scalpel over the hernia defect. Electrocautery was used to dissect through subcutaneous tissue, the hernia sac was opened and rises of adhesion was performed with Metzenbaum's scissors. Hernia sac was excised. Omentum was chronically incarcerated. I resected a tongue of omentum w cautery in order to reduce the hernia The hernia was measured and the mesh was selected. Interrupted Ethibond sutures were placed on  each corner of the mesh and then was inserted through the hernia defect. No evidence of bowel injuries. The mesh was placed in an underlay fashion ( 8 cm ventralex Mesh). 4 trans-fascial sutures were placed and used to anchor the mesh.The mesh lay really nicely against the abdominal wall. Defect was closed with 0 ethibond sutures in an interrupted fashion  Incisions was closed in a 2 layer fashion with 3-0 Vicryl and 4-0 Monocryl. Dermabond was used to coat the skin. Liposomal Marcaine  was used to inject the incision  and fascial sites. Patient tolerated procedure well and there were no immediate complications. Needle and laparotomy counts were correct   Caroleen Hamman, MD, FACS

## 2021-06-08 NOTE — Discharge Instructions (Addendum)
Open Hernia Repair, Adult, Care After What can I expect after the procedure? After the procedure, it is common to have: Mild discomfort. Slight bruising. Mild swelling. Pain in the belly (abdomen). A small amount of blood from the cut from surgery (incision). Follow these instructions at home: Your doctor may give you more specific instructions. If you have problems, call your doctor. Medicines Take over-the-counter and prescription medicines only as told by your doctor. If told, take steps to prevent problems with pooping (constipation). You may need to: Drink enough fluid to keep your pee (urine) pale yellow. Take medicines. You will be told what medicines to take. Eat foods that are high in fiber. These include beans, whole grains, and fresh fruits and vegetables. Limit foods that are high in fat and sugar. These include fried or sweet foods. Ask your doctor if you should avoid driving or using machines while you are taking your medicine. Incision care Follow instructions from your doctor about how to take care of your incision. Make sure you: Wash your hands with soap and water for at least 20 seconds before and after you change your bandage (dressing). If you cannot use soap and water, use hand sanitizer. Change your bandage. Leave stitches or skin glue in place for at least 2 weeks. Leave tape strips alone unless you are told to take them off. You may trim the edges of the tape strips if they curl up. Check your incision every day for signs of infection. Check for: More redness, swelling, or pain. More fluid or blood. Warmth. Pus or a bad smell. Wear loose, soft clothing while your incision heals. Activity Rest as told by your doctor. Do not lift anything that is heavier than 10 lb (4.5 kg), or the limit that you are told. Do not play contact sports until your doctor says that this is safe. If you were given a sedative during your procedure, do not drive or use machines until  your doctor says that it is safe. A sedative is a medicine that helps you relax. Return to your normal activities when your doctor says that it is safe. General instructions Do not take baths, swim, or use a hot tub. Ask your doctor about taking showers or sponge baths. Hold a pillow over your belly when you cough or sneeze. This helps with pain. Do not smoke or use any products that contain nicotine or tobacco. If you need help quitting, ask your doctor. Keep all follow-up visits. Contact a doctor if: You have any of these signs of infection in or around your incision: More redness, swelling, or pain. More fluid or blood. Warmth. Pus. A bad smell. You have a fever or chills. You have blood in your poop (stool). You have not pooped (had a bowel movement) in 2-3 days. Medicine does not help your pain. Get help right away if: You have chest pain, or you are short of breath. You feel faint or light-headed. You have very bad pain. You vomit and your pain is worse. You have pain, swelling, or redness in a leg. These symptoms may be an emergency. Get help right away. Call your local emergency services (911 in the [place] Do not wait to see if the symptoms will go away. Do not drive yourself to the hospital. Summary After this procedure, it is common to have mild discomfort, slight bruising, and mild swelling. Follow instructions from your doctor about how to take care of your cut from surgery (incision). Check every day for  signs of infection. Do not lift heavy objects or play contact sports until your doctor says it is safe. Return to your normal activities as told by your doctor. This information is not intended to replace advice given to you by your health care provider. Make sure you discuss any questions you have with your health care provider. Document Revised: 02/08/2020 Document Reviewed: 02/08/2020 Elsevier Patient Education  2022 [place]   The drugs that you were given will stay in your system until tomorrow so for the next 24 hours you should not:  Drive an automobile Make any legal decisions Drink any alcoholic beverage   You may resume regular meals tomorrow.  Today it is better to start with liquids and gradually work up to solid foods.  You may eat anything you prefer, but it is better to start with liquids, then soup and crackers, and gradually work up to solid foods.   Please notify your doctor immediately if you have any unusual bleeding, trouble breathing, redness and pain at the surgery site, drainage, fever, or pain not relieved by medication.    Additional Instructions:     Please contact your physician with any problems or Same Day Surgery at [phone], Monday through Friday 6 am to 4 pm, or [place] at [facility] number at [phone].

## 2021-06-08 NOTE — Anesthesia Preprocedure Evaluation (Signed)
Anesthesia Evaluation  Patient identified by MRN, date of birth, ID band Patient awake    Reviewed: Allergy & Precautions, NPO status , Patient's Chart, lab work & pertinent test results  History of Anesthesia Complications (+) Family history of anesthesia reaction  Airway Mallampati: II  TM Distance: >3 FB Neck ROM: Full    Dental  (+) Teeth Intact   Pulmonary neg pulmonary ROS, Current Smoker and Patient abstained from smoking.,    Pulmonary exam normal breath sounds clear to auscultation       Cardiovascular Exercise Tolerance: Good hypertension, Pt. on medications negative cardio ROS Normal cardiovascular exam Rhythm:Regular     Neuro/Psych negative neurological ROS  negative psych ROS   GI/Hepatic negative GI ROS, Neg liver ROS,   Endo/Other  negative endocrine ROSdiabetes, Well Controlled, Type 2  Renal/GU negative Renal ROS     Musculoskeletal   Abdominal Normal abdominal exam  (+)   Peds negative pediatric ROS (+)  Hematology negative hematology ROS (+) Blood dyscrasia, anemia ,   Anesthesia Other Findings Past Medical History: No date: Anemia No date: Arthritis 2202: Complication of anesthesia     Comment:  DURING SCAR TISSUE EXCISION FROM FALLOPIAN TUBES(2002),               PT WAS GIVEN GENERAL ANESTHESIA AND PT BEGAN HAVING A               REACTION TO THE ANESTHESIA AND LIPS AND TONGUE STARTED               SWELLING AND THE SURGERY HAD TO BE STOPPED DUE TO AIRWAY               CLOSING UP-PT HAD GA PRIOR TO THIS FOR HER GALLBLADDER               WITH NO PROBLEM PREVIOUSLY No date: Depression No date: Diabetes mellitus without complication (HCC) No date: Dysrhythmia     Comment:  H/O TACHYCARDIA. stress and anxiety brings it on No date: Family history of adverse reaction to anesthesia     Comment:  [place] UP No date: Hypertension No date: Irregular periods No date: Vitamin D  deficiency     Comment:  per patient, no longer a problem  Past Surgical History: No date: CHOLECYSTECTOMY 09/30/2017: DILATATION & CURETTAGE/HYSTEROSCOPY WITH MYOSURE; N/A     Comment:  Procedure: DILATATION & CURETTAGE/HYSTEROSCOPY;                Surgeon: Brayton Mars, MD;  Location: [facility];               Service: Gynecology;  Laterality: N/A; 09/19/2015: DILITATION & CURRETTAGE/HYSTROSCOPY WITH NOVASURE  ABLATION; N/A     Comment:  Procedure: DILATATION & CURETTAGE/HYSTEROSCOPY WITH               NOVASURE ABLATION;  Surgeon: Brayton Mars, MD;                Location: [facility];  Service: Gynecology;  Laterality:               N/A; No date: JOINT REPLACEMENT; Left     Comment:  30 years ago. knee No date: KNEE ARTHROSCOPY; Left  BMI    Body Mass Index: 30.13 kg/m      Reproductive/Obstetrics negative OB ROS  Anesthesia Physical Anesthesia Plan  ASA: 3  Anesthesia Plan: General   Post-op Pain Management:    Induction: Intravenous  PONV Risk Score and Plan: 1 and Ondansetron  Airway Management Planned: Oral ETT  Additional Equipment:   Intra-op Plan:   Post-operative Plan: Extubation in OR  Informed Consent: I have reviewed the patients History and Physical, chart, labs and discussed the procedure including the risks, benefits and alternatives for the proposed anesthesia with the patient or authorized representative who has indicated his/her understanding and acceptance.     Dental Advisory Given  Plan Discussed with: CRNA and Surgeon  Anesthesia Plan Comments:         Anesthesia Quick Evaluation

## 2021-06-08 NOTE — Anesthesia Postprocedure Evaluation (Signed)
Anesthesia Post Note  Patient: Michelle Marshall  Procedure(s) Performed: HERNIA REPAIR VENTRAL ADULT, incisional INSERTION OF MESH  Patient location during evaluation: PACU Anesthesia Type: General Level of consciousness: awake and awake and alert Pain management: pain level controlled Vital Signs Assessment: post-procedure vital signs reviewed and stable Respiratory status: spontaneous breathing Cardiovascular status: blood pressure returned to baseline and stable Postop Assessment: no headache Anesthetic complications: no   No notable events documented.   Last Vitals:  Vitals:   06/08/21 0919  BP: (!) 149/96  Pulse: 98  Resp: 17  Temp: 36.9 C  SpO2: 100%    Last Pain:  Vitals:   06/08/21 0919  TempSrc: Tympanic  PainSc: 0-No pain                 VAN STAVEREN,[place]

## 2021-06-08 NOTE — Transfer of Care (Signed)
Immediate Anesthesia Transfer of Care Note  Patient: Michelle Marshall  Procedure(s) Performed: HERNIA REPAIR VENTRAL ADULT, incisional INSERTION OF MESH  Patient Location: PACU  Anesthesia Type:General  Level of Consciousness: awake, alert  and oriented  Airway & Oxygen Therapy: Patient Spontanous Breathing and Patient connected to face mask oxygen  Post-op Assessment: Report given to RN and Post -op Vital signs reviewed and stable  Post vital signs: Reviewed and stable  Last Vitals:  Vitals Value Taken Time  BP    Temp    Pulse    Resp    SpO2      Last Pain:  Vitals:   06/08/21 0919  TempSrc: Tympanic  PainSc: 0-No pain         Complications: No notable events documented.

## 2021-06-09 ENCOUNTER — Encounter: Payer: Self-pay | Admitting: Surgery

## 2021-06-09 LAB — SURGICAL PATHOLOGY

## 2021-06-22 ENCOUNTER — Other Ambulatory Visit: Payer: Self-pay | Admitting: Primary Care

## 2021-06-22 DIAGNOSIS — G4700 Insomnia, unspecified: Secondary | ICD-10-CM

## 2021-06-28 ENCOUNTER — Other Ambulatory Visit: Payer: Self-pay

## 2021-06-28 ENCOUNTER — Ambulatory Visit (INDEPENDENT_AMBULATORY_CARE_PROVIDER_SITE_OTHER): Payer: 59 | Admitting: Surgery

## 2021-06-28 ENCOUNTER — Encounter: Payer: Self-pay | Admitting: Surgery

## 2021-06-28 VITALS — BP 120/83 | HR 94 | Temp 98.1°F | Ht 69.0 in | Wt 203.8 lb

## 2021-06-28 DIAGNOSIS — Z09 Encounter for follow-up examination after completed treatment for conditions other than malignant neoplasm: Secondary | ICD-10-CM

## 2021-06-28 DIAGNOSIS — K430 Incisional hernia with obstruction, without gangrene: Secondary | ICD-10-CM

## 2021-06-28 NOTE — Patient Instructions (Addendum)
If you have any concerns or questions, please feel free to call our office. Follow up as needed.   GENERAL POST-OPERATIVE PATIENT INSTRUCTIONS   WOUND CARE INSTRUCTIONS:  Keep a dry clean dressing on the wound if there is drainage. The initial bandage may be removed after 24 hours.  Once the wound has quit draining you may leave it open to air.  If clothing rubs against the wound or causes irritation and the wound is not draining you may cover it with a dry dressing during the daytime.  Try to keep the wound dry and avoid ointments on the wound unless directed to do so.  If the wound becomes bright red and painful or starts to drain infected material that is not clear, please contact your physician immediately.  If the wound is mildly pink and has a thick firm ridge underneath it, this is normal, and is referred to as a healing ridge.  This will resolve over the next 4-6 weeks.  BATHING: You may shower if you have been informed of this by your surgeon. However, Please do not submerge in a tub, hot tub, or pool until incisions are completely sealed or have been told by your surgeon that you may do so.  DIET:  You may eat any foods that you can tolerate.  It is a good idea to eat a high fiber diet and take in plenty of fluids to prevent constipation.  If you do become constipated you may want to take a mild laxative or take ducolax tablets on a daily basis until your bowel habits are regular.  Constipation can be very uncomfortable, along with straining, after recent surgery.  ACTIVITY:  You are encouraged to cough and deep breath or use your incentive spirometer if you were given one, every 15-30 minutes when awake.  This will help prevent respiratory complications and low grade fevers post-operatively if you had a general anesthetic.  You may want to hug a pillow when coughing and sneezing to add additional support to the surgical area, if you had abdominal or chest surgery, which will decrease pain  during these times.  You are encouraged to walk and engage in light activity for the next two weeks.  You should not lift, pull, or push more than 20 pounds for 4-6 weeks after surgery as it could put you at increased risk for complications.  Twenty pounds is roughly equivalent to a plastic bag of groceries. At that time- Listen to your body when lifting, if you have pain when lifting, stop and then try again in a few days. Soreness after doing exercises or activities of daily living is normal as you get back in to your normal routine.  MEDICATIONS:  Try to take narcotic medications and anti-inflammatory medications, such as tylenol, ibuprofen, naprosyn, etc., with food.  This will minimize stomach upset from the medication.  Should you develop nausea and vomiting from the pain medication, or develop a rash, please discontinue the medication and contact your physician.  You should not drive, make important decisions, or operate machinery when taking narcotic pain medication.  SUNBLOCK Use sun block to incision area over the next year if this area will be exposed to sun. This helps decrease scarring and will allow you avoid a permanent darkened area over your incision.  QUESTIONS:  Please feel free to call our office if you have any questions, and we will be glad to assist you.

## 2021-06-30 NOTE — Progress Notes (Signed)
Michelle Marshall is 2 weeks out from robotic ventral hernia repair . No pain.  No fevers no chills taking p.o. and having BMs  PE NAD ABd: Soft nontender incisions healing well without evidence of infection or hernias.  No peritonitis   A/P doing very well without surgical complications. RTC in a few months. lifting restrictions

## 2021-07-26 ENCOUNTER — Ambulatory Visit (INDEPENDENT_AMBULATORY_CARE_PROVIDER_SITE_OTHER): Payer: 59 | Admitting: Obstetrics and Gynecology

## 2021-07-26 ENCOUNTER — Other Ambulatory Visit: Payer: Self-pay

## 2021-07-26 ENCOUNTER — Encounter: Payer: Self-pay | Admitting: Obstetrics and Gynecology

## 2021-07-26 VITALS — BP 139/83 | HR 102 | Ht 69.0 in | Wt 208.0 lb

## 2021-07-26 DIAGNOSIS — N950 Postmenopausal bleeding: Secondary | ICD-10-CM | POA: Diagnosis not present

## 2021-07-26 DIAGNOSIS — N951 Menopausal and female climacteric states: Secondary | ICD-10-CM | POA: Diagnosis not present

## 2021-07-26 MED ORDER — ESTRADIOL-NORETHINDRONE ACET 1-0.5 MG PO TABS
1.0000 | ORAL_TABLET | Freq: Every day | ORAL | 0 refills | Status: DC
Start: 2021-07-26 — End: 2021-11-13

## 2021-07-26 NOTE — Progress Notes (Signed)
HPI:      [name] is a 56 y.o. G2P0020 who LMP was No LMP recorded. Patient has had an ablation.  Subjective:   She presents today for multiple reasons.  Mainly she has had spotting over the last few months on and off.  She has recently resumed intercourse but initially did not associate intercourse with her new onset of spotting. She was previously taking continuous hormone replacement therapy because she states she was in "premenopausal".  She has stopped that a few years ago.  She now believes that she is in actual menopause because she has hot flashes and night sweats. Of significant note patient has heavy previous endometrial ablation. She has not had a Pap smear or mammogram in several years.  She reports no previous abnormals.    Hx: The following portions of the patient's history were reviewed and updated as appropriate:             She  has a past medical history of Anemia, Arthritis, Complication of anesthesia (2002), Depression, Diabetes mellitus without complication ([place]), Dysrhythmia, Family history of adverse reaction to anesthesia, Hypertension, Irregular periods, and Vitamin D deficiency. She does not have any pertinent problems on file. She  has a past surgical history that includes Cholecystectomy; Knee arthroscopy (Left); Dilatation & currettage/hysteroscopy with novasure ablation (N/A, 09/19/2015); Joint replacement (Left); Dilatation & curettage/hysteroscopy with myosure (N/A, 09/30/2017); Ventral hernia repair (N/A, 06/08/2021); and Insertion of mesh (06/08/2021). Her family history includes Cancer in her father; Diabetes in her paternal grandmother; Hypertension in her mother. She  reports that she quit smoking about 5 years ago. Her smoking use included cigarettes. She has a 3.00 pack-year smoking history. She has never used smokeless tobacco. She reports current alcohol use of about 1.0 standard drink per week. She reports that she does not use drugs. She has a  current medication list which includes the following prescription(s): carvedilol, cyclobenzaprine, epinephrine, estradiol-norethindrone, glipizide, hydrochlorothiazide, ibuprofen, potassium chloride, ozempic (1 mg/dose), sertraline, sertraline, trazodone, varenicline, and potassium chloride sa. She is allergic to lisinopril, losartan, wellbutrin [bupropion], amlodipine, and percocet [oxycodone-acetaminophen].       Review of Systems:  Review of Systems  Constitutional: Denied constitutional symptoms, night sweats, recent illness, fatigue, fever, insomnia and weight loss.  Eyes: Denied eye symptoms, eye pain, photophobia, vision change and visual disturbance.  Ears/Nose/Throat/Neck: Denied ear, nose, throat or neck symptoms, hearing loss, nasal discharge, sinus congestion and sore throat.  Cardiovascular: Denied cardiovascular symptoms, arrhythmia, chest pain/pressure, edema, exercise intolerance, orthopnea and palpitations.  Respiratory: Denied pulmonary symptoms, asthma, pleuritic pain, productive sputum, cough, dyspnea and wheezing.  Gastrointestinal: Denied, gastro-esophageal reflux, melena, nausea and vomiting.  Genitourinary: See HPI for additional information.  Musculoskeletal: Denied musculoskeletal symptoms, stiffness, swelling, muscle weakness and myalgia.  Dermatologic: Denied dermatology symptoms, rash and scar.  Neurologic: Denied neurology symptoms, dizziness, headache, neck pain and syncope.  Psychiatric: Denied psychiatric symptoms, anxiety and depression.  Endocrine: Denied endocrine symptoms including hot flashes and night sweats.   Meds:   Current Outpatient Medications on File Prior to Visit  Medication Sig Dispense Refill   carvedilol (COREG) 12.5 MG tablet TAKE 1 TABLET(12.5 MG) BY MOUTH TWICE DAILY WITH A MEAL for heart rate 180 tablet 3   cyclobenzaprine (FLEXERIL) 5 MG tablet Take 1 tablet (5 mg total) by mouth 3 (three) times daily as needed for muscle spasms. 15  tablet 0   EPINEPHrine 0.3 mg/0.3 mL IJ SOAJ injection Inject 0.3 mg into the muscle as needed for  anaphylaxis.     glipiZIDE (GLUCOTROL XL) 10 MG 24 hr tablet Take 1 tablet (10 mg total) by mouth daily with breakfast. For diabetes. 90 tablet 3   hydrochlorothiazide (HYDRODIURIL) 25 MG tablet Take 1 tablet (25 mg total) by mouth daily. For blood pressure. (Patient taking differently: Take 25 mg by mouth every evening. For blood pressure.) 90 tablet 3   ibuprofen (ADVIL) 800 MG tablet Take 1 tablet (800 mg total) by mouth every 8 (eight) hours as needed for moderate pain. 30 tablet 0   potassium chloride (KLOR-CON) 10 MEQ tablet Take 1 tablet (10 mEq total) by mouth daily. (Patient taking differently: Take 10 mEq by mouth every evening.) 90 tablet 1   Semaglutide, 1 MG/DOSE, (OZEMPIC, 1 MG/DOSE,) 2 MG/1.5ML SOPN Inject 1 mg into the skin once a week. For diabetes. (Patient taking differently: Inject 1 mg into the skin every Thursday. For diabetes.) 9 mL 1   sertraline (ZOLOFT) 100 MG tablet TAKE 1 TABLET(100 MG) BY MOUTH DAILY for anxiety (Patient taking differently: Take 100 mg by mouth at bedtime. TAKE 1 TABLET(100 MG) BY MOUTH DAILY for anxiety) 90 tablet 3   sertraline (ZOLOFT) 25 MG tablet Take 1 tablet (25 mg total) by mouth daily. For anxiety. Take with 100 mg tablet. (Patient taking differently: Take 25 mg by mouth at bedtime. For anxiety. Take with 100 mg tablet.) 90 tablet 3   traZODone (DESYREL) 150 MG tablet TAKE 1 TABLET BY MOUTH EVERY DAY AT BEDTIME FOR SLEEP 90 tablet 1   varenicline (CHANTIX CONTINUING MONTH PAK) 1 MG tablet Take 1 tablet (1 mg total) by mouth 2 (two) times daily. 60 tablet 0   potassium chloride SA (KLOR-CON M) 20 MEQ tablet Take 2 tablets (40 mEq total) by mouth 2 (two) times daily for 7 days. 28 tablet 0   No current facility-administered medications on file prior to visit.      Objective:     Vitals:   07/26/21 1332  BP: 139/83  Pulse: (!) 102   Filed  Weights   07/26/21 1332  Weight: 208 lb (94.3 kg)                        Assessment:    G2P0020 Patient Active Problem List   Diagnosis Date Noted   Hypokalemia 16/04/9603   Umbilical hernia without obstruction and without gangrene 05/17/2021   Chronic left-sided low back pain without sciatica 05/17/2021   Tobacco abuse 01/05/2021   Allergic reaction 11/22/2020   Acute pain of right foot 08/31/2020   Abdominal mass 04/06/2020   Acute pain of left knee 10/15/2018   Seasonal allergies 10/10/2018   Grief 04/23/2018   Postop check-status post hysteroscopy/D&C 10/09/2017   Abnormal pelvic ultrasound 09/12/2017   Cervical stenosis (uterine cervix) 09/12/2017   GAD (generalized anxiety disorder) 05/04/2016   Amenorrhea 01/25/2016   Status post endometrial ablation 01/25/2016   Insomnia 10/13/2015   Submucous leiomyoma of uterus 08/21/2015   Abnormal uterine bleeding 08/21/2015   Type 2 diabetes mellitus with hyperglycemia ([place]) 08/05/2015   Hypertension 08/05/2015   Obesity 08/05/2015   H/O: iron deficiency anemia 08/05/2015     1. Postmenopausal bleeding   2. Symptomatic menopausal or female climacteric states     Patient very likely menopausal based on symptoms and age.  Bleeding likely consistent with atrophic vaginitis especially in light of history of recent intercourse and remote history of endometrial ablation.  However, cannot rule out hyperplasia or  malignancy at this time.   Plan:            1.  Ultrasound to assess endometrial thickness  2.  Patient very interested in beginning HRT.   HRT I have discussed HRT with the patient in detail.  The risk/benefits of it were reviewed.  She understands that during menopause Estrogen decreases dramatically and that this results in an increased risk of cardiovascular disease as well as osteoporosis.  We have also discussed the fact that hot flashes often result from a decrease in Estrogen, and that by replacing Estrogen,  they can often be alleviated.  We have discussed skin, vaginal and urinary tract changes that may also take place from this drop in Estrogen.  Emotional changes have also been linked to Estrogen and we have briefly discussed this.  The benefits of HRT including decrease in hot flashes, vaginal dryness, and osteoporosis were discussed.  The emotional benefit and a possible change in her cardiovascular risk profile was also reviewed.  The risks associated with Hormone Replacement Therapy were also reviewed.  The use of unopposed Estrogen and its relationship to endometrial cancer was discussed.  The addition of Progesterone and its beneficial effect on endometrial cancer was also noted.  The fact that there has been no consistent definitive studies showing an increase in breast cancer in women who use HRT was discussed with the patient.  The possible side effects including breast tenderness, fluid retention, mood changes and vaginal bleeding were discussed.  The patient was informed that this is an elective medication and that she may choose not to take Hormone Replacement Therapy.  Literature on HRT was made available, and I believe that after answering all of the patients questions.  Special emphasis on the WHI study, as well as several studies since that pertaining to the risks and benefits of estrogen replacement therapy were compared.  The possible limitations of these studies were discussed including the age stratification of the WHI study.  The possible increased role of Progesterone in these studies was discussed in detail.  Different types of hormone formulation and methods of taking hormone replacement therapy were discussed. Literature on HRT was made available, and I believe that after answering all of the patients questions she has an adequate and informed understanding of the risks and benefits of HRT. Will start [place].  3.  Patient to schedule for ultrasound follow-up and annual examination with  Pap and mammography in 4 weeks.  We can also follow-up her HRT at that time.  Orders Orders Placed This Encounter  Procedures   US PELVIS TRANSVAGINAL NON-OB (TV ONLY)   US PELVIS (TRANSABDOMINAL ONLY)     Meds ordered this encounter  Medications   estradiol-norethindrone (ACTIVELLA) 1-0.5 MG tablet    Sig: Take 1 tablet by mouth daily.    Dispense:  90 tablet    Refill:  0      F/U  Return in about 1 month (around 08/26/2021) for Annual Physical. I spent 35 minutes involved in the care of this patient preparing to see the patient by obtaining and reviewing her medical history (including labs, imaging tests and prior procedures), documenting clinical information in the electronic health record (EHR), counseling and coordinating care plans, writing and sending prescriptions, ordering tests or procedures and in direct communicating with the patient and medical staff discussing pertinent items from her history and physical exam.  Finis Bud, M.D. 07/26/2021 2:14 PM

## 2021-08-02 ENCOUNTER — Other Ambulatory Visit: Payer: Self-pay

## 2021-08-02 ENCOUNTER — Ambulatory Visit (INDEPENDENT_AMBULATORY_CARE_PROVIDER_SITE_OTHER): Payer: 59

## 2021-08-02 ENCOUNTER — Other Ambulatory Visit: Payer: Self-pay | Admitting: Obstetrics and Gynecology

## 2021-08-02 DIAGNOSIS — N950 Postmenopausal bleeding: Secondary | ICD-10-CM

## 2021-08-09 NOTE — Progress Notes (Signed)
Michelle Marshall: Your ultrasound continues to show some small fibroids, but we expected that.  These have likely shrunk from when you were premenopausal to now.  Because you are now in menopause I do not expect these to continue to be an issue.  The lining of your uterus is quite low which means that there is no hyperplasia or evidence of endometrial cancer!  Obviously this is an excellent result.  Please follow-up as suggested regarding your hormone replacement therapy.

## 2021-09-13 ENCOUNTER — Other Ambulatory Visit: Payer: Self-pay | Admitting: Primary Care

## 2021-09-13 DIAGNOSIS — F411 Generalized anxiety disorder: Secondary | ICD-10-CM

## 2021-09-13 NOTE — Telephone Encounter (Signed)
Tried to call pt to schedule an appt but left a vm ?

## 2021-09-13 NOTE — Telephone Encounter (Signed)
Support Pool: ? ?Patient due for diabetes follow up. ?Needs to be seen for further diabetes medication refills, Ozempic. ?

## 2021-09-14 ENCOUNTER — Other Ambulatory Visit: Payer: Self-pay | Admitting: Primary Care

## 2021-09-14 ENCOUNTER — Encounter: Payer: 59 | Admitting: Obstetrics and Gynecology

## 2021-09-14 DIAGNOSIS — E119 Type 2 diabetes mellitus without complications: Secondary | ICD-10-CM

## 2021-09-14 NOTE — Telephone Encounter (Signed)
Patient has appointment for 09/28/21. Would like to have refills sent in.  ?

## 2021-09-14 NOTE — Telephone Encounter (Signed)
Due for diabetes follow up. ?Needs to be seen now please. ? ?Thanks. ?

## 2021-09-28 ENCOUNTER — Ambulatory Visit: Payer: 59 | Admitting: Primary Care

## 2021-10-03 ENCOUNTER — Ambulatory Visit (INDEPENDENT_AMBULATORY_CARE_PROVIDER_SITE_OTHER): Payer: 59 | Admitting: Primary Care

## 2021-10-03 ENCOUNTER — Other Ambulatory Visit: Payer: Self-pay

## 2021-10-03 ENCOUNTER — Encounter: Payer: Self-pay | Admitting: Primary Care

## 2021-10-03 ENCOUNTER — Other Ambulatory Visit: Payer: Self-pay | Admitting: Primary Care

## 2021-10-03 VITALS — BP 132/82 | HR 79 | Temp 98.6°F | Ht 69.0 in | Wt 198.0 lb

## 2021-10-03 DIAGNOSIS — M545 Low back pain, unspecified: Secondary | ICD-10-CM

## 2021-10-03 DIAGNOSIS — Z720 Tobacco use: Secondary | ICD-10-CM

## 2021-10-03 DIAGNOSIS — I10 Essential (primary) hypertension: Secondary | ICD-10-CM | POA: Diagnosis not present

## 2021-10-03 DIAGNOSIS — Z23 Encounter for immunization: Secondary | ICD-10-CM | POA: Diagnosis not present

## 2021-10-03 DIAGNOSIS — E876 Hypokalemia: Secondary | ICD-10-CM

## 2021-10-03 DIAGNOSIS — E1165 Type 2 diabetes mellitus with hyperglycemia: Secondary | ICD-10-CM

## 2021-10-03 DIAGNOSIS — Z1211 Encounter for screening for malignant neoplasm of colon: Secondary | ICD-10-CM

## 2021-10-03 DIAGNOSIS — G8929 Other chronic pain: Secondary | ICD-10-CM

## 2021-10-03 LAB — BASIC METABOLIC PANEL
BUN: 16 mg/dL (ref 6–23)
CO2: 26 mEq/L (ref 19–32)
Calcium: 9.6 mg/dL (ref 8.4–10.5)
Chloride: 103 mEq/L (ref 96–112)
Creatinine, Ser: 0.91 mg/dL (ref 0.40–1.20)
GFR: 71.49 mL/min (ref >60.00)
Glucose, Bld: 175 mg/dL — ABNORMAL HIGH (ref 70–99)
Potassium: 3.3 mEq/L — ABNORMAL LOW (ref 3.5–5.1)
Sodium: 139 mEq/L (ref 135–145)

## 2021-10-03 LAB — POCT GLYCOSYLATED HEMOGLOBIN (HGB A1C): Hemoglobin A1C: 6.6 % — AB (ref 4.0–5.6)

## 2021-10-03 MED ORDER — CARVEDILOL 12.5 MG PO TABS: ORAL_TABLET | ORAL | 0 refills | Status: DC

## 2021-10-03 MED ORDER — POTASSIUM CHLORIDE CRYS ER 20 MEQ PO TBCR: 20.0000 meq | EXTENDED_RELEASE_TABLET | Freq: Every day | ORAL | 0 refills | Status: DC

## 2021-10-03 MED ORDER — CYCLOBENZAPRINE HCL 5 MG PO TABS: 5.0000 mg | ORAL_TABLET | Freq: Three times a day (TID) | ORAL | 0 refills | Status: DC | PRN

## 2021-10-03 NOTE — Assessment & Plan Note (Signed)
Borderline high, however patient reveals today that she was taking carvedilol 12.5 mg once daily. ? ?We discussed to increase carvedilol to 12.5 mg twice daily, continue HCTZ 25 mg daily. ? ?Continue potassium chloride 10 mEq daily ?Repeat BMP pending. ?

## 2021-10-03 NOTE — Assessment & Plan Note (Signed)
Significant improvement with A1c of 6.6 today! ? ?Commended her on her healthier lifestyle, encouraged exercise. ? ?Continue Ozempic 1 mg weekly, glipizide XL 10 mg daily. ? ?We will plan to see her back later this summer for physical. ?

## 2021-10-03 NOTE — Assessment & Plan Note (Signed)
Chronic, overall stable for patient. ? ?Continue cyclobenzaprine 5 mg daily as needed. ?Offered physical therapy, she currently declines but will notify when ready. ? ?Commended her on weight loss. ?

## 2021-10-03 NOTE — Assessment & Plan Note (Signed)
She quit in December 2022, commended her for this! ?

## 2021-10-03 NOTE — Assessment & Plan Note (Signed)
Likely secondary to HCTZ, however she is intolerant of multiple other blood pressure regimens. ? ?Repeat BMP pending. ?Continue potassium chloride 10 mEQ daily. ?

## 2021-10-03 NOTE — Patient Instructions (Addendum)
Stop by the lab prior to leaving today. I will notify you of your results once received.  ? ?Keep up the great work!! ? ?It was a pleasure to see you today! ? ?

## 2021-10-03 NOTE — Progress Notes (Signed)
? ?Subjective:  ? ? Patient ID: Michelle Marshall, female    DOB: 1965-09-15, 56 y.o.   MRN: [id number] ? ?HPI ? ?Michelle Marshall is a very pleasant 56 y.o. female with a history of hypertension, type 2 diabetes, tobacco abuse, chronic low back pain who presents today for follow up of diabetes. She is also due for potassium check. She is requesting refills of cyclobenzaprine. She is needing a referral to GI for colonoscopy. ? ?She is also due for her first Shingrix vaccine.  ? ?1) Type 2 Diabetes: Current medications include: Glipizide XL 10 mg, Ozempic 1 mg weekly.  ? ?Last A1C: 7.2 in October 2022, 6.6 today ?Last Eye Exam: Due ?Last Foot Exam: Due ?Pneumonia Vaccination: 2018 ?Urine Microalbumin: UTD ?Statin: None.  ? ?Dietary changes since last visit: Limiting bread and pastas. She's increased protein and vegetables.  ? ? ?Exercise: No regular exercise. Active at home.  ? ?Wt Readings from Last 3 Encounters:  ?10/03/21 198 lb (89.8 kg)  ?07/26/21 208 lb (94.3 kg)  ?06/28/21 203 lb 12.8 oz (92.4 kg)  ? ? ?BP Readings from Last 3 Encounters:  ?10/03/21 132/82  ?07/26/21 139/83  ?06/28/21 120/83  ? ?2) Chronic Back Pain: Chronic to the left lower back. Currently prescribed cyclobenzaprine 5 mg for which she uses sparingly. She is needing refills today. She is not interested in PT at the time as she cannot get off from work. ? ?3) Hypokalemia: Chronic over the last 6 months.  Currently prescribed potassium chloride 10 mEq for which she takes daily.  She is due for repeat potassium check.  She is managed on HCTZ 25 mg daily, she is intolerant to numerous other blood pressure medications. ? ?4) Essential Hypertension: Currently managed on HCTZ 25 mg daily, carvedilol 12.5 mg twice daily.  Today patient mentions she is taking carvedilol 12.5 mg once daily and has been doing so for the last year.  She is also managed on potassium chloride 10 mEq for hypokalemia suspected to be secondary to HCTZ. She cannot tolerate  other classes of antihypertensive regimens. ? ? ?Review of Systems  ?Respiratory:  Negative for shortness of breath.   ?Cardiovascular:  Negative for chest pain.  ?Musculoskeletal:  Positive for arthralgias and back pain.  ?Neurological:  Negative for numbness and headaches.  ? ?   ? ? ?Past Medical History:  ?Diagnosis Date  ? Anemia   ? Arthritis   ? Complication of anesthesia 2002  ? DURING SCAR TISSUE EXCISION FROM FALLOPIAN TUBES(2002), PT WAS GIVEN GENERAL ANESTHESIA AND PT BEGAN HAVING A REACTION TO THE ANESTHESIA AND LIPS AND TONGUE STARTED SWELLING AND THE SURGERY HAD TO BE STOPPED DUE TO AIRWAY CLOSING UP-PT HAD GA PRIOR TO THIS FOR HER GALLBLADDER WITH NO PROBLEM PREVIOUSLY  ? Depression   ? Diabetes mellitus without complication ([place])   ? Dysrhythmia   ? H/O TACHYCARDIA. stress and anxiety brings it on  ? Family history of adverse reaction to anesthesia   ? [place] UP  ? Hypertension   ? Irregular periods   ? Vitamin D deficiency   ? per patient, no longer a problem  ? ? ?Social History  ? ?Socioeconomic History  ? Marital status: Widowed  ?  Spouse name: Not on file  ? Number of children: 1  ? Years of education: Not on file  ? Highest education level: Not on file  ?Occupational History  ? Not on file  ?Tobacco Use  ? Smoking status: Former  ?  Packs/day: 0.15  ?  Years: 20.00  ?  Pack years: 3.00  ?  Types: Cigarettes  ?  Quit date: 09/05/2015  ?  Years since quitting: 6.0  ? Smokeless tobacco: Never  ?Vaping Use  ? Vaping Use: Never used  ?Substance and Sexual Activity  ? Alcohol use: Yes  ?  Alcohol/week: 1.0 standard drink  ?  Types: 1 Glasses of wine per week  ?  Comment: occasional glass of wine  ? Drug use: No  ? Sexual activity: Not Currently  ?  Birth control/protection: None  ?Other Topics Concern  ? Not on file  ?Social History Narrative  ? Married.  ? 1 child.  ? She works as a Designer, industrial/product  ? Enjoys shopping, traveling.   ? ?Social Determinants of Health  ? ?Financial  Resource Strain: Not on file  ?Food Insecurity: Not on file  ?Transportation Needs: Not on file  ?Physical Activity: Not on file  ?Stress: Not on file  ?Social Connections: Not on file  ?Intimate Partner Violence: Not on file  ? ? ?Past Surgical History:  ?Procedure Laterality Date  ? CHOLECYSTECTOMY    ? DILATATION & CURETTAGE/HYSTEROSCOPY WITH MYOSURE N/A 09/30/2017  ? Procedure: DILATATION & CURETTAGE/HYSTEROSCOPY;  Surgeon: Brayton Mars, MD;  Location: [facility];  Service: Gynecology;  Laterality: N/A;  ? DILITATION & CURRETTAGE/HYSTROSCOPY WITH NOVASURE ABLATION N/A 09/19/2015  ? Procedure: DILATATION & CURETTAGE/HYSTEROSCOPY WITH NOVASURE ABLATION;  Surgeon: Brayton Mars, MD;  Location: [facility];  Service: Gynecology;  Laterality: N/A;  ? INSERTION OF MESH  06/08/2021  ? Procedure: INSERTION OF MESH;  Surgeon: Jules Husbands, MD;  Location: [facility];  Service: General;;  ? JOINT REPLACEMENT Left   ? 30 years ago. knee  ? KNEE ARTHROSCOPY Left   ? VENTRAL HERNIA REPAIR N/A 06/08/2021  ? Procedure: HERNIA REPAIR VENTRAL ADULT, incisional;  Surgeon: Jules Husbands, MD;  Location: [facility];  Service: General;  Laterality: N/A;  ? ? ?Family History  ?Problem Relation Age of Onset  ? Cancer Father   ?     throat  ? Hypertension Mother   ? Diabetes Paternal Grandmother   ? Breast cancer Neg Hx   ? ? ?Allergies  ?Allergen Reactions  ? Lisinopril Swelling  ?  Lips and face  ? Losartan Other (See Comments)  ?  Mouth swollen, rash and itching  ? Wellbutrin [Bupropion] Hives and Swelling  ?  Swelling of mouth  ? Amlodipine Swelling  ? Percocet [Oxycodone-Acetaminophen] Anxiety  ? ? ?Current Outpatient Medications on File Prior to Visit  ?Medication Sig Dispense Refill  ? EPINEPHrine 0.3 mg/0.3 mL IJ SOAJ injection Inject 0.3 mg into the muscle as needed for anaphylaxis.    ? glipiZIDE (GLUCOTROL XL) 10 MG 24 hr tablet Take 1 tablet (10 mg total) by mouth daily with breakfast. For diabetes. Office visit  required for further refills. 30 tablet 0  ? hydrochlorothiazide (HYDRODIURIL) 25 MG tablet Take 1 tablet (25 mg total) by mouth daily. For blood pressure. (Patient taking differently: Take 25 mg by mouth every evening. For blood pressure.) 90 tablet 3  ? ibuprofen (ADVIL) 800 MG tablet Take 1 tablet (800 mg total) by mouth every 8 (eight) hours as needed for moderate pain. 30 tablet 0  ? potassium chloride (KLOR-CON) 10 MEQ tablet Take 1 tablet (10 mEq total) by mouth daily. (Patient taking differently: Take 10 mEq by mouth every evening.) 90 tablet 1  ? Semaglutide, 1 MG/DOSE, (OZEMPIC,  1 MG/DOSE,) 2 MG/1.5ML SOPN Inject 1 mg into the skin once a week. For diabetes. (Patient taking differently: Inject 1 mg into the skin every Thursday. For diabetes.) 9 mL 1  ? sertraline (ZOLOFT) 100 MG tablet TAKE 1 TABLET(100 MG) BY MOUTH DAILY for anxiety (Patient taking differently: Take 100 mg by mouth at bedtime. TAKE 1 TABLET(100 MG) BY MOUTH DAILY for anxiety) 90 tablet 3  ? sertraline (ZOLOFT) 25 MG tablet Take 1 tablet (25 mg total) by mouth daily. For anxiety. Take with 100 mg tablet. (Patient taking differently: Take 25 mg by mouth at bedtime. For anxiety. Take with 100 mg tablet.) 90 tablet 3  ? traZODone (DESYREL) 150 MG tablet TAKE 1 TABLET BY MOUTH EVERY DAY AT BEDTIME FOR SLEEP 90 tablet 1  ? estradiol-norethindrone (ACTIVELLA) 1-0.5 MG tablet Take 1 tablet by mouth daily. (Patient not taking: Reported on 10/03/2021) 90 tablet 0  ? ?No current facility-administered medications on file prior to visit.  ? ? ?BP 132/82   Pulse 79   Temp 98.6 ?F (37 ?C) (Oral)   Ht '5\' 9"'$  (1.753 m)   Wt 198 lb (89.8 kg)   SpO2 97%   BMI 29.24 kg/m?  ?Objective:  ? Physical Exam ?Cardiovascular:  ?   Rate and Rhythm: Normal rate and regular rhythm.  ?Pulmonary:  ?   Effort: Pulmonary effort is normal.  ?   Breath sounds: Normal breath sounds.  ?Musculoskeletal:  ?   Cervical back: Neck supple.  ?Skin: ?   General: Skin is warm and  dry.  ?Psychiatric:     ?   Mood and Affect: Mood normal.  ? ? ? ? ? ?   ?Assessment & Plan:  ? ? ? ? ?This visit occurred during the SARS-CoV-2 public health emergency.  Safety protocols were in place, including scre

## 2021-10-04 ENCOUNTER — Telehealth: Payer: Self-pay

## 2021-10-04 NOTE — Telephone Encounter (Signed)
CALLED PATIENT NO ANSWER LEFT VOICEMAIL FOR A CALL BACK ? ?

## 2021-10-04 NOTE — Addendum Note (Signed)
Addended by: Francella Solian on: 10/04/2021 09:07 AM ? ? Modules accepted: Orders ? ?

## 2021-10-05 ENCOUNTER — Telehealth: Payer: Self-pay

## 2021-10-05 NOTE — Telephone Encounter (Signed)
CALLED PATIENT NO ANSWER LEFT VOICEMAIL FOR A CALL BACK ? ?

## 2021-10-06 ENCOUNTER — Telehealth: Payer: Self-pay

## 2021-10-06 ENCOUNTER — Other Ambulatory Visit: Payer: Self-pay | Admitting: Primary Care

## 2021-10-06 ENCOUNTER — Encounter: Payer: 59 | Admitting: Obstetrics and Gynecology

## 2021-10-06 DIAGNOSIS — E876 Hypokalemia: Secondary | ICD-10-CM

## 2021-10-06 NOTE — Telephone Encounter (Signed)
CALLED PATIENT NO ANSWER LEFT VOICEMAIL FOR A CALL BACK °Letter sent °

## 2021-11-10 ENCOUNTER — Other Ambulatory Visit: Payer: Self-pay | Admitting: Obstetrics and Gynecology

## 2021-11-10 DIAGNOSIS — N951 Menopausal and female climacteric states: Secondary | ICD-10-CM

## 2021-11-13 NOTE — Telephone Encounter (Signed)
Activella Rx refilled per pt request ?

## 2021-11-21 ENCOUNTER — Other Ambulatory Visit (HOSPITAL_COMMUNITY)
Admission: RE | Admit: 2021-11-21 | Discharge: 2021-11-21 | Disposition: A | Discharge status: Home / Self Care | Payer: 59 | Source: Ambulatory Visit | Attending: Obstetrics and Gynecology | Admitting: Obstetrics and Gynecology

## 2021-11-21 ENCOUNTER — Encounter: Payer: Self-pay | Admitting: Obstetrics and Gynecology

## 2021-11-21 ENCOUNTER — Ambulatory Visit (INDEPENDENT_AMBULATORY_CARE_PROVIDER_SITE_OTHER): Payer: 59 | Admitting: Obstetrics and Gynecology

## 2021-11-21 VITALS — BP 137/94 | HR 85 | Ht 69.0 in | Wt 194.1 lb

## 2021-11-21 DIAGNOSIS — Z79890 Hormone replacement therapy (postmenopausal): Secondary | ICD-10-CM

## 2021-11-21 DIAGNOSIS — N951 Menopausal and female climacteric states: Secondary | ICD-10-CM

## 2021-11-21 DIAGNOSIS — Z124 Encounter for screening for malignant neoplasm of cervix: Secondary | ICD-10-CM

## 2021-11-21 DIAGNOSIS — Z1211 Encounter for screening for malignant neoplasm of colon: Secondary | ICD-10-CM

## 2021-11-21 DIAGNOSIS — Z1231 Encounter for screening mammogram for malignant neoplasm of breast: Secondary | ICD-10-CM

## 2021-11-21 DIAGNOSIS — Z01419 Encounter for gynecological examination (general) (routine) without abnormal findings: Secondary | ICD-10-CM

## 2021-11-21 MED ORDER — ESTRADIOL-NORETHINDRONE ACET 1-0.5 MG PO TABS: 1.0000 | ORAL_TABLET | Freq: Every day | ORAL | 3 refills | Status: AC

## 2021-11-21 NOTE — Progress Notes (Signed)
Patients presents for annual exam today. She states continued use of Mimvey and has not had any bleeding. ?Patient is due for pap smear, ordered. Patient is due for mammogram, ordered. Cologuard ordered. Patients annual labs are ordered. Patient states no other questions or concerns at this time.   ?

## 2021-11-21 NOTE — Progress Notes (Signed)
HPI: ?     Michelle Marshall is a 56 y.o. [id number] who LMP was No LMP recorded. Patient has had an ablation. ? ?Subjective:  ? ?She presents today for her annual examination.  She states that she no longer has any postmenopausal bleeding.  On her HRT she is still having occasional daily hot flashes but is much improved.  Does not want to change doses.  Overall she seems very satisfied with HRT. ?She does state that she occasionally has back pain and has recently flared up.  She has had this back pain for greater than a year and previously had "x-rays" that revealed arthritis.  They recommended physical therapy. ?Patient has had a previous endometrial ablation ? ?  Hx: ?The following portions of the patient's history were reviewed and updated as appropriate: ?            She  has a past medical history of Anemia, Arthritis, Complication of anesthesia (2002), Depression, Diabetes mellitus without complication ([place]), Dysrhythmia, Family history of adverse reaction to anesthesia, Grief (04/23/2018), Hypertension, Irregular periods, and Vitamin D deficiency. ?She does not have any pertinent problems on file. ?She  has a past surgical history that includes Cholecystectomy; Knee arthroscopy (Left); Dilatation & currettage/hysteroscopy with novasure ablation (N/A, 09/19/2015); Joint replacement (Left); Dilatation & curettage/hysteroscopy with myosure (N/A, 09/30/2017); Ventral hernia repair (N/A, 06/08/2021); and Insertion of mesh (06/08/2021). ?Her family history includes Cancer in her father; Diabetes in her paternal grandmother; Hypertension in her mother. ?She  reports that she has been smoking cigarettes. She has a 3.00 pack-year smoking history. She has never used smokeless tobacco. She reports that she does not currently use alcohol after a past usage of about 1.0 standard drink per week. She reports that she does not use drugs. ?She has a current medication list which includes the following prescription(s):  carvedilol, cyclobenzaprine, epinephrine, glipizide, hydrochlorothiazide, ibuprofen, mimvey, multivitamin, potassium chloride sa, ozempic (1 mg/dose), sertraline, trazodone, and sertraline. ?She is allergic to lisinopril, losartan, wellbutrin [bupropion], amlodipine, and percocet [oxycodone-acetaminophen]. ?      ?Review of Systems:  ?Review of Systems ? ?Constitutional: Denied constitutional symptoms, night sweats, recent illness, fatigue, fever, insomnia and weight loss.  ?Eyes: Denied eye symptoms, eye pain, photophobia, vision change and visual disturbance.  ?Ears/Nose/Throat/Neck: Denied ear, nose, throat or neck symptoms, hearing loss, nasal discharge, sinus congestion and sore throat.  ?Cardiovascular: Denied cardiovascular symptoms, arrhythmia, chest pain/pressure, edema, exercise intolerance, orthopnea and palpitations.  ?Respiratory: Denied pulmonary symptoms, asthma, pleuritic pain, productive sputum, cough, dyspnea and wheezing.  ?Gastrointestinal: Denied, gastro-esophageal reflux, melena, nausea and vomiting.  ?Genitourinary: Denied genitourinary symptoms including symptomatic vaginal discharge, pelvic relaxation issues, and urinary complaints.  ?Musculoskeletal: Denied musculoskeletal symptoms, stiffness, swelling, muscle weakness and myalgia.  ?Dermatologic: Denied dermatology symptoms, rash and scar.  ?Neurologic: Denied neurology symptoms, dizziness, headache, neck pain and syncope.  ?Psychiatric: Denied psychiatric symptoms, anxiety and depression.  ?Endocrine: Denied endocrine symptoms including hot flashes and night sweats.  ? ?Meds: ?  ?Current Outpatient Medications on File Prior to Visit  ?Medication Sig Dispense Refill  ? carvedilol (COREG) 12.5 MG tablet TAKE 1 TABLET(12.5 MG) BY MOUTH TWICE DAILY WITH A MEAL for heart rate 180 tablet 0  ? cyclobenzaprine (FLEXERIL) 5 MG tablet Take 1 tablet (5 mg total) by mouth 3 (three) times daily as needed for muscle spasms. 30 tablet 0  ? EPINEPHrine  0.3 mg/0.3 mL IJ SOAJ injection Inject 0.3 mg into the muscle as needed for anaphylaxis.    ?  glipiZIDE (GLUCOTROL XL) 10 MG 24 hr tablet Take 1 tablet (10 mg total) by mouth daily with breakfast. For diabetes. Office visit required for further refills. 30 tablet 0  ? hydrochlorothiazide (HYDRODIURIL) 25 MG tablet Take 1 tablet (25 mg total) by mouth daily. For blood pressure. (Patient taking differently: Take 25 mg by mouth every evening. For blood pressure.) 90 tablet 3  ? ibuprofen (ADVIL) 800 MG tablet Take 1 tablet (800 mg total) by mouth every 8 (eight) hours as needed for moderate pain. 30 tablet 0  ? MIMVEY 1-0.5 MG tablet TAKE 1 TABLET BY MOUTH EVERY DAY 28 tablet 2  ? Multiple Vitamin (MULTIVITAMIN) tablet Take 1 tablet by mouth daily.    ? potassium chloride SA (KLOR-CON M) 20 MEQ tablet Take 1 tablet (20 mEq total) by mouth daily. For low potassium. 90 tablet 0  ? Semaglutide, 1 MG/DOSE, (OZEMPIC, 1 MG/DOSE,) 2 MG/1.5ML SOPN Inject 1 mg into the skin once a week. For diabetes. (Patient taking differently: Inject 1 mg into the skin every Thursday. For diabetes.) 9 mL 1  ? sertraline (ZOLOFT) 25 MG tablet Take 1 tablet (25 mg total) by mouth daily. For anxiety. Take with 100 mg tablet. (Patient taking differently: Take 25 mg by mouth at bedtime. For anxiety. Take with 100 mg tablet.) 90 tablet 3  ? traZODone (DESYREL) 150 MG tablet TAKE 1 TABLET BY MOUTH EVERY DAY AT BEDTIME FOR SLEEP 90 tablet 1  ? sertraline (ZOLOFT) 100 MG tablet TAKE 1 TABLET(100 MG) BY MOUTH DAILY for anxiety (Patient taking differently: Take 100 mg by mouth at bedtime. TAKE 1 TABLET(100 MG) BY MOUTH DAILY for anxiety) 90 tablet 3  ? ?No current facility-administered medications on file prior to visit.  ? ? ? ?Objective:  ?  ? ?Vitals:  ? 11/21/21 0821  ?BP: (!) 137/94  ?Pulse: 85  ?  ?Filed Weights  ? 11/21/21 0821  ?Weight: 194 lb 1.6 oz (88 kg)  ? ?  ?         Physical examination ?General NAD, Conversant  ?HEENT Atraumatic; Op  clear with mmm.  Normo-cephalic. Pupils reactive. Anicteric sclerae  ?Thyroid/Neck Smooth without nodularity or enlargement. Normal ROM.  Neck Supple.  ?Skin No rashes, lesions or ulceration. Normal palpated skin turgor. No nodularity.  ?Breasts: No masses or discharge.  Symmetric.  No axillary adenopathy.  ?Lungs: Clear to auscultation.No rales or wheezes. Normal Respiratory effort, no retractions.  ?Heart: NSR.  No murmurs or rubs appreciated. No periferal edema  ?Abdomen: Soft.  Non-tender.  No masses.  No HSM. No hernia  ?Extremities: Moves all appropriately.  Normal ROM for age. No lymphadenopathy.  ?Neuro: Oriented to PPT.  Normal mood. Normal affect.  ? ?  Pelvic:   ?Vulva: Normal appearance.  No lesions.  ?Vagina: No lesions or abnormalities noted.  ?Support: Normal pelvic support.  ?Urethra No masses tenderness or scarring.  ?Meatus Normal size without lesions or prolapse.  ?Cervix: Normal appearance.  No lesions.  ?Anus: Normal exam.  No lesions.  ?Perineum: Normal exam.  No lesions.  ?      Bimanual   ?Uterus: Normal size.  Non-tender.  Mobile.  AV.  ?Adnexae: No masses.  Non-tender to palpation.  ?Cul-de-sac: Negative for abnormality.  ? ? ? ?Assessment:  ?  ?[id number] ?Patient Active Problem List  ? Diagnosis Date Noted  ? Hypokalemia 05/17/2021  ? Umbilical hernia without obstruction and without gangrene 05/17/2021  ? Chronic left-sided low back pain without sciatica 05/17/2021  ?  Tobacco abuse 01/05/2021  ? Allergic reaction 11/22/2020  ? Acute pain of right foot 08/31/2020  ? Abdominal mass 04/06/2020  ? Acute pain of left knee 10/15/2018  ? Seasonal allergies 10/10/2018  ? Postop check-status post hysteroscopy/D&C 10/09/2017  ? Abnormal pelvic ultrasound 09/12/2017  ? Cervical stenosis (uterine cervix) 09/12/2017  ? GAD (generalized anxiety disorder) 05/04/2016  ? Amenorrhea 01/25/2016  ? Status post endometrial ablation 01/25/2016  ? Insomnia 10/13/2015  ? Submucous leiomyoma of uterus 08/21/2015  ?  Abnormal uterine bleeding 08/21/2015  ? Type 2 diabetes mellitus with hyperglycemia ([place]) 08/05/2015  ? Hypertension 08/05/2015  ? Obesity 08/05/2015  ? H/O: iron deficiency anemia 08/05/2015  ? ?  ?1. Well woma

## 2021-11-22 ENCOUNTER — Telehealth: Payer: Self-pay | Admitting: Obstetrics and Gynecology

## 2021-11-22 LAB — BASIC METABOLIC PANEL
BUN/Creatinine Ratio: 19 (ref 9–23)
BUN: 16 mg/dL (ref 6–24)
CO2: 24 mmol/L (ref 20–29)
Calcium: 9.5 mg/dL (ref 8.7–10.2)
Chloride: 100 mmol/L (ref 96–106)
Creatinine, Ser: 0.86 mg/dL (ref 0.57–1.00)
Glucose: 191 mg/dL — ABNORMAL HIGH (ref 70–99)
Potassium: 4 mmol/L (ref 3.5–5.2)
Sodium: 140 mmol/L (ref 134–144)
eGFR: 80 mL/min/{1.73_m2} (ref >59)

## 2021-11-22 LAB — LIPID PANEL
Chol/HDL Ratio: 2.3 ratio (ref 0.0–4.4)
Cholesterol, Total: 108 mg/dL (ref 100–199)
HDL: 48 mg/dL (ref >39)
LDL Chol Calc (NIH): 47 mg/dL (ref 0–99)
Triglycerides: 59 mg/dL (ref 0–149)
VLDL Cholesterol Cal: 13 mg/dL (ref 5–40)

## 2021-11-22 LAB — CBC
Hematocrit: 41 % (ref 34.0–46.6)
Hemoglobin: 14.4 g/dL (ref 11.1–15.9)
MCH: 32.1 pg (ref 26.6–33.0)
MCHC: 35.1 g/dL (ref 31.5–35.7)
MCV: 92 fL (ref 79–97)
Platelets: 326 10*3/uL (ref 150–450)
RBC: 4.48 x10E6/uL (ref 3.77–5.28)
RDW: 11.8 % (ref 11.7–15.4)
WBC: 10.9 10*3/uL — ABNORMAL HIGH (ref 3.4–10.8)

## 2021-11-22 LAB — CYTOLOGY - PAP
Comment: NEGATIVE
Diagnosis: NEGATIVE
High risk HPV: NEGATIVE

## 2021-11-22 LAB — TSH: TSH: 2.13 u[IU]/mL (ref 0.450–4.500)

## 2021-11-22 LAB — HEMOGLOBIN A1C
Est. average glucose Bld gHb Est-mCnc: 177 mg/dL
Hgb A1c MFr Bld: 7.8 % — ABNORMAL HIGH (ref 4.8–5.6)

## 2021-11-22 NOTE — Telephone Encounter (Signed)
Pt returned call, advised  of CMA note. Pt voiced understanding ?

## 2021-11-29 ENCOUNTER — Other Ambulatory Visit: Payer: Self-pay | Admitting: Primary Care

## 2021-11-29 DIAGNOSIS — E1165 Type 2 diabetes mellitus with hyperglycemia: Secondary | ICD-10-CM

## 2021-11-30 ENCOUNTER — Other Ambulatory Visit: Payer: Self-pay | Admitting: Primary Care

## 2021-11-30 DIAGNOSIS — F411 Generalized anxiety disorder: Secondary | ICD-10-CM

## 2021-12-19 ENCOUNTER — Other Ambulatory Visit: Payer: Self-pay | Admitting: Primary Care

## 2021-12-19 DIAGNOSIS — G4700 Insomnia, unspecified: Secondary | ICD-10-CM

## 2021-12-25 ENCOUNTER — Other Ambulatory Visit: Payer: Self-pay | Admitting: Primary Care

## 2021-12-25 DIAGNOSIS — E876 Hypokalemia: Secondary | ICD-10-CM

## 2021-12-30 ENCOUNTER — Other Ambulatory Visit: Payer: Self-pay | Admitting: Primary Care

## 2021-12-30 DIAGNOSIS — I10 Essential (primary) hypertension: Secondary | ICD-10-CM

## 2022-01-14 ENCOUNTER — Other Ambulatory Visit: Payer: Self-pay | Admitting: Primary Care

## 2022-01-14 DIAGNOSIS — I10 Essential (primary) hypertension: Secondary | ICD-10-CM

## 2022-01-17 ENCOUNTER — Encounter: Payer: Self-pay | Admitting: Primary Care

## 2022-01-17 ENCOUNTER — Ambulatory Visit (INDEPENDENT_AMBULATORY_CARE_PROVIDER_SITE_OTHER): Payer: 59 | Admitting: Primary Care

## 2022-01-17 VITALS — BP 130/84 | HR 78 | Temp 98.6°F | Ht 69.0 in | Wt 192.0 lb

## 2022-01-17 DIAGNOSIS — M25559 Pain in unspecified hip: Secondary | ICD-10-CM | POA: Insufficient documentation

## 2022-01-17 DIAGNOSIS — M25551 Pain in right hip: Secondary | ICD-10-CM

## 2022-01-17 DIAGNOSIS — Z23 Encounter for immunization: Secondary | ICD-10-CM

## 2022-01-17 DIAGNOSIS — M545 Low back pain, unspecified: Secondary | ICD-10-CM

## 2022-01-17 DIAGNOSIS — I10 Essential (primary) hypertension: Secondary | ICD-10-CM

## 2022-01-17 DIAGNOSIS — Z0001 Encounter for general adult medical examination with abnormal findings: Secondary | ICD-10-CM | POA: Insufficient documentation

## 2022-01-17 DIAGNOSIS — E1165 Type 2 diabetes mellitus with hyperglycemia: Secondary | ICD-10-CM | POA: Diagnosis not present

## 2022-01-17 DIAGNOSIS — F411 Generalized anxiety disorder: Secondary | ICD-10-CM

## 2022-01-17 DIAGNOSIS — Z0000 Encounter for general adult medical examination without abnormal findings: Secondary | ICD-10-CM | POA: Diagnosis not present

## 2022-01-17 DIAGNOSIS — F5102 Adjustment insomnia: Secondary | ICD-10-CM

## 2022-01-17 DIAGNOSIS — E876 Hypokalemia: Secondary | ICD-10-CM

## 2022-01-17 DIAGNOSIS — G8929 Other chronic pain: Secondary | ICD-10-CM

## 2022-01-17 MED ORDER — SEMAGLUTIDE (2 MG/DOSE) 8 MG/3ML ~~LOC~~ SOPN: 2.0000 mg | PEN_INJECTOR | SUBCUTANEOUS | 1 refills | Status: DC

## 2022-01-17 MED ORDER — CYCLOBENZAPRINE HCL 5 MG PO TABS: 5.0000 mg | ORAL_TABLET | Freq: Three times a day (TID) | ORAL | 0 refills | Status: DC | PRN

## 2022-01-17 NOTE — Patient Instructions (Addendum)
Complete the Cologuard kit once received.  Schedule your mammogram.  We increased the dose of your Ozempic to 2 mg weekly.  Schedule a lab only appointment for 3 months to repeat your A1c.  Please schedule a follow up visit for 6 months for a diabetes check.  It was a pleasure to see you today!  Preventive Care 4-55 Years Old, Female Preventive care refers to lifestyle choices and visits with your health care provider that can promote health and wellness. Preventive care visits are also called wellness exams. What can I expect for my preventive care visit? Counseling Your health care provider may ask you questions about your: Medical history, including: Past medical problems. Family medical history. Pregnancy history. Current health, including: Menstrual cycle. Method of birth control. Emotional well-being. Home life and relationship well-being. Sexual activity and sexual health. Lifestyle, including: Alcohol, nicotine or tobacco, and drug use. Access to firearms. Diet, exercise, and sleep habits. Work and work Statistician. Sunscreen use. Safety issues such as seatbelt and bike helmet use. Physical exam Your health care provider will check your: Height and weight. These may be used to calculate your BMI (body mass index). BMI is a measurement that tells if you are at a healthy weight. Waist circumference. This measures the distance around your waistline. This measurement also tells if you are at a healthy weight and may help predict your risk of certain diseases, such as type 2 diabetes and high blood pressure. Heart rate and blood pressure. Body temperature. Skin for abnormal spots. What immunizations do I need?  Vaccines are usually given at various ages, according to a schedule. Your health care provider will recommend vaccines for you based on your age, medical history, and lifestyle or other factors, such as travel or where you work. What tests do I  need? Screening Your health care provider may recommend screening tests for certain conditions. This may include: Lipid and cholesterol levels. Diabetes screening. This is done by checking your blood sugar (glucose) after you have not eaten for a while (fasting). Pelvic exam and Pap test. Hepatitis B test. Hepatitis C test. HIV (human immunodeficiency virus) test. STI (sexually transmitted infection) testing, if you are at risk. Lung cancer screening. Colorectal cancer screening. Mammogram. Talk with your health care provider about when you should start having regular mammograms. This may depend on whether you have a family history of breast cancer. BRCA-related cancer screening. This may be done if you have a family history of breast, ovarian, tubal, or peritoneal cancers. Bone density scan. This is done to screen for osteoporosis. Talk with your health care provider about your test results, treatment options, and if necessary, the need for more tests. Follow these instructions at home: Eating and drinking  Eat a diet that includes fresh fruits and vegetables, whole grains, lean protein, and low-fat dairy products. Take vitamin and mineral supplements as recommended by your health care provider. Do not drink alcohol if: Your health care provider tells you not to drink. You are pregnant, may be pregnant, or are planning to become pregnant. If you drink alcohol: Limit how much you have to 0-1 drink a day. Know how much alcohol is in your drink. In the [place] one drink equals one 12 oz bottle of beer (355 mL), one 5 oz glass of wine (148 mL), or one 1 oz glass of hard liquor (44 mL). Lifestyle Brush your teeth every morning and night with fluoride toothpaste. Floss one time each day. Exercise for at least 30 minutes  5 or more days each week. Do not use any products that contain nicotine or tobacco. These products include cigarettes, chewing tobacco, and vaping devices, such as  e-cigarettes. If you need help quitting, ask your health care provider. Do not use drugs. If you are sexually active, practice safe sex. Use a condom or other form of protection to prevent STIs. If you do not wish to become pregnant, use a form of birth control. If you plan to become pregnant, see your health care provider for a prepregnancy visit. Take aspirin only as told by your health care provider. Make sure that you understand how much to take and what form to take. Work with your health care provider to find out whether it is safe and beneficial for you to take aspirin daily. Find healthy ways to manage stress, such as: Meditation, yoga, or listening to music. Journaling. Talking to a trusted person. Spending time with friends and family. Minimize exposure to UV radiation to reduce your risk of skin cancer. Safety Always wear your seat belt while driving or riding in a vehicle. Do not drive: If you have been drinking alcohol. Do not ride with someone who has been drinking. When you are tired or distracted. While texting. If you have been using any mind-altering substances or drugs. Wear a helmet and other protective equipment during sports activities. If you have firearms in your house, make sure you follow all gun safety procedures. Seek help if you have been physically or sexually abused. What's next? Visit your health care provider once a year for an annual wellness visit. Ask your health care provider how often you should have your eyes and teeth checked. Stay up to date on all vaccines. This information is not intended to replace advice given to you by your health care provider. Make sure you discuss any questions you have with your health care provider. Document Revised: 12/21/2020 Document Reviewed: 12/21/2020 Elsevier Patient Education  [employer].

## 2022-01-17 NOTE — Assessment & Plan Note (Signed)
Since recent fall.  Following with orthopedics, completed recent MRI.

## 2022-01-17 NOTE — Assessment & Plan Note (Signed)
Second Shingrix vaccine provided today. Other vaccines UTD. Mammogram due, she will schedule. Colon cancer screening due, she will complete Cologuard. Pap smear UTD, follows with GYN  Discussed the importance of a healthy diet and regular exercise in order for weight loss, and to reduce the risk of further co-morbidity.  Exam stable. Labs pending.  Follow up in 1 year for repeat physical.'

## 2022-01-17 NOTE — Assessment & Plan Note (Addendum)
Controlled.  Continue trazodone 150 mg nightly.

## 2022-01-17 NOTE — Assessment & Plan Note (Signed)
Reviewed recent A1C per GYN from May 2023 which is higher than usual at 7.8.  Increase Ozempic to 2 mg, she agrees. Continue Glipizide XL 10 mg daily.  Foot exam today. Pneumonia vaccine up-to-date. Eye exam up-to-date per patient. Urine microalbumin up-to-date.  Repeat A1c in 3 months, follow-up in 6 months.

## 2022-01-17 NOTE — Assessment & Plan Note (Signed)
Controlled.  Continue HCTZ 25 mg daily, potassium 20 mg daily.  Reviewed BMP from May 2023 per GYN.

## 2022-01-17 NOTE — Addendum Note (Signed)
Addended by: Francella Solian on: 01/17/2022 08:51 AM   Modules accepted: Orders

## 2022-01-17 NOTE — Assessment & Plan Note (Signed)
Overall stable.  Continue cyclobenzaprine 5 mg as needed. Refill provided today.

## 2022-01-17 NOTE — Assessment & Plan Note (Signed)
Secondary to thiazide diuretic.  Stable now.  Continue potassium chloride 20 mEq daily.

## 2022-01-17 NOTE — Progress Notes (Signed)
Subjective:    Patient ID: Michelle Marshall, female    DOB: October 19, 1965, 56 y.o.   MRN: [id number]  HPI  Michelle Marshall is a very pleasant 56 y.o. female who presents today for complete physical and follow up of chronic conditions.  Immunizations: -Tetanus: 2021 -Influenza: Completed last season  -Covid-19: 3 vaccines -Shingles: Completed 1 dose of Shingrix -Pneumonia: 2018  Diet: [place].  Exercise: No regular exercise.  Eye exam: Completes annually  Dental exam: Completes semi-annually   Pap Smear: Completed in 2023 Mammogram: Completed years ago. She will schedule.   Colonoscopy: Never completed. Was recently provided Cologuard by GYN.   BP Readings from Last 3 Encounters:  01/17/22 130/84  11/21/21 (!) 137/94  10/03/21 132/82    Wt Readings from Last 3 Encounters:  01/17/22 192 lb (87.1 kg)  11/21/21 194 lb 1.6 oz (88 kg)  10/03/21 198 lb (89.8 kg)       Review of Systems  Constitutional:  Negative for unexpected weight change.  HENT:  Negative for rhinorrhea.   Respiratory:  Negative for cough and shortness of breath.   Cardiovascular:  Negative for chest pain.  Gastrointestinal:  Negative for constipation and diarrhea.  Genitourinary:  Negative for difficulty urinating.  Musculoskeletal:  Positive for arthralgias. Negative for myalgias.  Skin:  Negative for rash.  Allergic/Immunologic: Negative for environmental allergies.  Neurological:  Negative for dizziness and headaches.  Psychiatric/Behavioral:  The patient is not nervous/anxious.          Past Medical History:  Diagnosis Date   Abnormal uterine bleeding 08/21/2015   May 2018-spotting associated mild cramping   Acute pain of left knee 10/15/2018   Acute pain of right foot 08/31/2020   Amenorrhea 01/25/2016   Anemia    Arthritis    Complication of anesthesia 2002   DURING SCAR TISSUE EXCISION FROM FALLOPIAN TUBES(2002), PT WAS GIVEN GENERAL ANESTHESIA AND PT BEGAN HAVING A REACTION  TO THE ANESTHESIA AND LIPS AND TONGUE STARTED SWELLING AND THE SURGERY HAD TO BE STOPPED DUE TO AIRWAY CLOSING UP-PT HAD GA PRIOR TO THIS FOR HER GALLBLADDER WITH NO PROBLEM PREVIOUSLY   Depression    Diabetes mellitus without complication ([place])    Dysrhythmia    H/O TACHYCARDIA. stress and anxiety brings it on   Family history of adverse reaction to anesthesia    [place] UP   Grief 04/23/2018   Recent loss of husband August 2019-brain aneurysm   Hypertension    Irregular periods    Postop check-status post hysteroscopy/D&C 10/09/2017   Status post endometrial ablation 01/25/2016   Vitamin D deficiency    per patient, no longer a problem    Social History   Socioeconomic History   Marital status: Widowed    Spouse name: Not on file   Number of children: 1   Years of education: Not on file   Highest education level: Not on file  Occupational History   Not on file  Tobacco Use   Smoking status: Some Days    Packs/day: 0.15    Years: 20.00    Total pack years: 3.00    Types: Cigarettes    Last attempt to quit: 09/05/2015    Years since quitting: 6.3   Smokeless tobacco: Never  Vaping Use   Vaping Use: Never used  Substance and Sexual Activity   Alcohol use: Not Currently    Alcohol/week: 1.0 standard drink of alcohol    Types: 1 Glasses of wine per week  Comment: occasional glass of wine   Drug use: No   Sexual activity: Yes    Birth control/protection: None, Post-menopausal    Comment: ablation  Other Topics Concern   Not on file  Social History Narrative   Married.   1 child.   She works as a Designer, industrial/product   Enjoys shopping, traveling.    Social Determinants of Health   Financial Resource Strain: Not on file  Food Insecurity: Not on file  Transportation Needs: Not on file  Physical Activity: Inactive (05/15/2018)   Exercise Vital Sign    Days of Exercise per Week: 0 days    Minutes of Exercise per Session: 0 min  Stress: Not on file   Social Connections: Not on file  Intimate Partner Violence: Not on file    Past Surgical History:  Procedure Laterality Date   [place] N/A 09/30/2017   Procedure: DILATATION & CURETTAGE/HYSTEROSCOPY;  Surgeon: Brayton Mars, MD;  Location: [facility];  Service: Gynecology;  Laterality: N/A;   DILITATION & CURRETTAGE/HYSTROSCOPY WITH NOVASURE ABLATION N/A 09/19/2015   Procedure: DILATATION & CURETTAGE/HYSTEROSCOPY WITH NOVASURE ABLATION;  Surgeon: Brayton Mars, MD;  Location: [facility];  Service: Gynecology;  Laterality: N/A;   INSERTION OF MESH  06/08/2021   Procedure: INSERTION OF MESH;  Surgeon: Jules Husbands, MD;  Location: [facility];  Service: General;;   JOINT REPLACEMENT Left    30 years ago. knee   KNEE ARTHROSCOPY Left    VENTRAL HERNIA REPAIR N/A 06/08/2021   Procedure: HERNIA REPAIR VENTRAL ADULT, incisional;  Surgeon: Jules Husbands, MD;  Location: [facility];  Service: General;  Laterality: N/A;    Family History  Problem Relation Age of Onset   Cancer Father        throat   Hypertension Mother    Diabetes Paternal Grandmother    Breast cancer Neg Hx     Allergies  Allergen Reactions   Lisinopril Swelling    Lips and face   Losartan Other (See Comments)    Mouth swollen, rash and itching   Wellbutrin [Bupropion] Hives and Swelling    Swelling of mouth   Amlodipine Swelling   Percocet [Oxycodone-Acetaminophen] Anxiety    Current Outpatient Medications on File Prior to Visit  Medication Sig Dispense Refill   carvedilol (COREG) 12.5 MG tablet TAKE 1 TABLET(12.5 MG) BY MOUTH TWICE DAILY WITH A MEAL FOR HEART RATE 180 tablet 2   EPINEPHrine 0.3 mg/0.3 mL IJ SOAJ injection Inject 0.3 mg into the muscle as needed for anaphylaxis.     estradiol-norethindrone (MIMVEY) 1-0.5 MG tablet Take 1 tablet by mouth daily. 90 tablet 3   glipiZIDE (GLUCOTROL XL) 10 MG 24 hr tablet Take 1 tablet (10 mg total)  by mouth daily with breakfast. For diabetes. Office visit required for further refills. 30 tablet 0   hydrochlorothiazide (HYDRODIURIL) 25 MG tablet TAKE 1 TABLET BY MOUTH EVERY DAY FOR BLOOD PRESSURE 90 tablet 1   ibuprofen (ADVIL) 800 MG tablet Take 1 tablet (800 mg total) by mouth every 8 (eight) hours as needed for moderate pain. 30 tablet 0   KLOR-CON M20 20 MEQ tablet TAKE 1 TABLET (20 MEQ TOTAL) BY MOUTH DAILY. FOR LOW POTASSIUM. 90 tablet 0   Multiple Vitamin (MULTIVITAMIN) tablet Take 1 tablet by mouth daily.     sertraline (ZOLOFT) 100 MG tablet TAKE 1 TABLET(100 MG) BY MOUTH DAILY for anxiety (Patient taking differently: Take 100  mg by mouth at bedtime. TAKE 1 TABLET(100 MG) BY MOUTH DAILY for anxiety) 90 tablet 3   traZODone (DESYREL) 150 MG tablet TAKE 1 TABLET BY MOUTH EVERY DAY AT BEDTIME FOR SLEEP 90 tablet 0   sertraline (ZOLOFT) 25 MG tablet TAKE 1 TABLET (25 MG TOTAL) BY MOUTH DAILY. FOR ANXIETY. TAKE WITH 100 MG TABLET. (Patient not taking: Reported on 01/17/2022) 90 tablet 0   No current facility-administered medications on file prior to visit.    BP 130/84   Pulse 78   Temp 98.6 F (37 C) (Oral)   Ht '5\' 9"'$  (1.753 m)   Wt 192 lb (87.1 kg)   SpO2 95%   BMI 28.35 kg/m  Objective:   Physical Exam HENT:     Right Ear: Tympanic membrane and ear canal normal.     Left Ear: Tympanic membrane and ear canal normal.     Nose: Nose normal.  Eyes:     Conjunctiva/sclera: Conjunctivae normal.     Pupils: Pupils are equal, round, and reactive to light.  Neck:     Thyroid: No thyromegaly.  Cardiovascular:     Rate and Rhythm: Normal rate and regular rhythm.     Heart sounds: No murmur heard. Pulmonary:     Effort: Pulmonary effort is normal.     Breath sounds: Normal breath sounds. No rales.  Abdominal:     General: Bowel sounds are normal.     Palpations: Abdomen is soft.     Tenderness: There is no abdominal tenderness.  Musculoskeletal:     Cervical back: Neck  supple.     Right hip: Decreased range of motion.  Lymphadenopathy:     Cervical: No cervical adenopathy.  Skin:    General: Skin is warm and dry.     Findings: No rash.  Neurological:     Mental Status: She is alert and oriented to person, place, and time.     Cranial Nerves: No cranial nerve deficit.     Deep Tendon Reflexes: Reflexes are normal and symmetric.  Psychiatric:        Mood and Affect: Mood normal.           Assessment & Plan:   Problem List Items Addressed This Visit       Cardiovascular and Mediastinum   Hypertension    Controlled.  Continue HCTZ 25 mg daily, potassium 20 mg daily.  Reviewed BMP from May 2023 per GYN.        Endocrine   Type 2 diabetes mellitus with hyperglycemia ([place])    Reviewed recent A1C per GYN from May 2023 which is higher than usual at 7.8.  Increase Ozempic to 2 mg, she agrees. Continue Glipizide XL 10 mg daily.  Foot exam today. Pneumonia vaccine up-to-date. Eye exam up-to-date per patient. Urine microalbumin up-to-date.  Repeat A1c in 3 months, follow-up in 6 months.      Relevant Medications   Semaglutide, 2 MG/DOSE, 8 MG/3ML SOPN     Other   Insomnia    Controlled.  Continue trazodone 150 mg nightly.      GAD (generalized anxiety disorder)    Controlled.  Continue sertraline 125 mg daily.      Hypokalemia    Secondary to thiazide diuretic.  Stable now.  Continue potassium chloride 20 mEq daily.      Chronic left-sided low back pain without sciatica    Overall stable.  Continue cyclobenzaprine 5 mg as needed. Refill provided today.  Relevant Medications   cyclobenzaprine (FLEXERIL) 5 MG tablet   Acute hip pain    Since recent fall.  Following with orthopedics, completed recent MRI.       Preventative health care - Primary    Second Shingrix vaccine provided today. Other vaccines UTD. Mammogram due, she will schedule. Colon cancer screening due, she will complete Cologuard. Pap  smear UTD, follows with GYN  Discussed the importance of a healthy diet and regular exercise in order for weight loss, and to reduce the risk of further co-morbidity.  Exam stable. Labs pending.  Follow up in 1 year for repeat physical.'          Pleas Koch, NP

## 2022-01-17 NOTE — Assessment & Plan Note (Signed)
Controlled.  Continue sertraline 125 mg daily.

## 2022-02-27 ENCOUNTER — Other Ambulatory Visit: Payer: Self-pay | Admitting: Primary Care

## 2022-02-27 DIAGNOSIS — F411 Generalized anxiety disorder: Secondary | ICD-10-CM

## 2022-03-21 ENCOUNTER — Other Ambulatory Visit: Payer: Self-pay | Admitting: Primary Care

## 2022-03-21 DIAGNOSIS — G4700 Insomnia, unspecified: Secondary | ICD-10-CM

## 2022-03-21 DIAGNOSIS — E876 Hypokalemia: Secondary | ICD-10-CM

## 2022-04-14 ENCOUNTER — Other Ambulatory Visit: Payer: Self-pay | Admitting: Primary Care

## 2022-04-14 DIAGNOSIS — E1165 Type 2 diabetes mellitus with hyperglycemia: Secondary | ICD-10-CM

## 2022-04-19 ENCOUNTER — Other Ambulatory Visit: Payer: 59

## 2022-04-23 ENCOUNTER — Other Ambulatory Visit (INDEPENDENT_AMBULATORY_CARE_PROVIDER_SITE_OTHER): Payer: 59

## 2022-04-23 DIAGNOSIS — E1165 Type 2 diabetes mellitus with hyperglycemia: Secondary | ICD-10-CM | POA: Diagnosis not present

## 2022-04-23 LAB — POCT GLYCOSYLATED HEMOGLOBIN (HGB A1C): Hemoglobin A1C: 7.3 % — AB (ref 4.0–5.6)

## 2022-06-04 ENCOUNTER — Encounter (INDEPENDENT_AMBULATORY_CARE_PROVIDER_SITE_OTHER): Payer: 59

## 2022-06-04 DIAGNOSIS — G8929 Other chronic pain: Secondary | ICD-10-CM

## 2022-06-04 DIAGNOSIS — E1165 Type 2 diabetes mellitus with hyperglycemia: Secondary | ICD-10-CM | POA: Diagnosis not present

## 2022-06-04 DIAGNOSIS — F411 Generalized anxiety disorder: Secondary | ICD-10-CM

## 2022-06-05 MED ORDER — SERTRALINE HCL 100 MG PO TABS: ORAL_TABLET | ORAL | 1 refills | Status: DC

## 2022-06-05 MED ORDER — SEMAGLUTIDE (1 MG/DOSE) 4 MG/3ML ~~LOC~~ SOPN: 2.0000 mg | PEN_INJECTOR | SUBCUTANEOUS | 1 refills | Status: DC

## 2022-06-05 MED ORDER — CYCLOBENZAPRINE HCL 5 MG PO TABS: 5.0000 mg | ORAL_TABLET | Freq: Three times a day (TID) | ORAL | 0 refills | Status: DC | PRN

## 2022-06-05 NOTE — Telephone Encounter (Signed)
Please see the MyChart message reply(ies) for my assessment and plan.  The patient gave consent for this Medical Advice Message and is aware that it may result in a bill to their insurance company as well as the possibility that this may result in a co-payment or deductible. They are an established patient, but are not seeking medical advice exclusively about a problem treated during an in person or video visit in the last 7 days. I did not recommend an in person or video visit within 7 days of my reply.  I spent a total of 10 minutes cumulative time within 7 days through [place], NP

## 2022-06-08 MED ORDER — TIRZEPATIDE 2.5 MG/0.5ML ~~LOC~~ SOAJ
2.5000 mg | SUBCUTANEOUS | 0 refills | Status: DC
Start: 2022-06-08 — End: 2022-07-03

## 2022-06-08 NOTE — Telephone Encounter (Signed)
Please see the MyChart message reply(ies) for my assessment and plan.  The patient gave consent for this Medical Advice Message and is aware that it may result in a bill to their insurance company as well as the possibility that this may result in a co-payment or deductible. They are an established patient, but are not seeking medical advice exclusively about a problem treated during an in person or video visit in the last 7 days. I did not recommend an in person or video visit within 7 days of my reply.  I spent a total of 10 minutes cumulative time within 7 days through [place], NP

## 2022-06-08 NOTE — Addendum Note (Signed)
Addended by: Pleas Koch on: 06/08/2022 06:55 AM   Modules accepted: Orders

## 2022-07-03 ENCOUNTER — Other Ambulatory Visit: Payer: Self-pay | Admitting: Primary Care

## 2022-07-03 DIAGNOSIS — E1165 Type 2 diabetes mellitus with hyperglycemia: Secondary | ICD-10-CM

## 2022-07-03 MED ORDER — TIRZEPATIDE 5 MG/0.5ML ~~LOC~~ SOAJ: 5.0000 mg | SUBCUTANEOUS | 0 refills | Status: DC

## 2022-07-20 ENCOUNTER — Ambulatory Visit: Payer: 59 | Admitting: Primary Care

## 2022-07-21 ENCOUNTER — Other Ambulatory Visit: Payer: Self-pay | Admitting: Primary Care

## 2022-07-21 DIAGNOSIS — I10 Essential (primary) hypertension: Secondary | ICD-10-CM

## 2022-07-26 ENCOUNTER — Other Ambulatory Visit: Payer: Self-pay | Admitting: Primary Care

## 2022-07-26 DIAGNOSIS — E1165 Type 2 diabetes mellitus with hyperglycemia: Secondary | ICD-10-CM

## 2022-07-26 NOTE — Telephone Encounter (Signed)
LVM for patient to call back and scheduled. Sent MyChart message.

## 2022-07-26 NOTE — Telephone Encounter (Signed)
Patient is due for diabetes follow up, this will be required prior to any further refills.  Please schedule, thank you!

## 2022-07-31 ENCOUNTER — Encounter: Payer: Self-pay | Admitting: Primary Care

## 2022-07-31 ENCOUNTER — Telehealth: Payer: Self-pay | Admitting: Primary Care

## 2022-07-31 ENCOUNTER — Ambulatory Visit (INDEPENDENT_AMBULATORY_CARE_PROVIDER_SITE_OTHER): Payer: 59 | Admitting: Primary Care

## 2022-07-31 VITALS — BP 138/90 | HR 75 | Temp 98.1°F | Ht 69.0 in | Wt 180.0 lb

## 2022-07-31 DIAGNOSIS — E1165 Type 2 diabetes mellitus with hyperglycemia: Secondary | ICD-10-CM | POA: Diagnosis not present

## 2022-07-31 DIAGNOSIS — I10 Essential (primary) hypertension: Secondary | ICD-10-CM | POA: Diagnosis not present

## 2022-07-31 LAB — POCT GLYCOSYLATED HEMOGLOBIN (HGB A1C): Hemoglobin A1C: 6.5 % — AB (ref 4.0–5.6)

## 2022-07-31 LAB — MICROALBUMIN / CREATININE URINE RATIO
Creatinine,U: 153.1 mg/dL
Microalb Creat Ratio: 1.3 mg/g (ref 0.0–30.0)
Microalb, Ur: 2 mg/dL — ABNORMAL HIGH (ref 0.0–1.9)

## 2022-07-31 NOTE — Telephone Encounter (Signed)
Samantha called from [facility] and stated it has been sent to the wrong office and it should have been sent to the [facility] location on [address] and also they don't do diabetic eye exams.

## 2022-07-31 NOTE — Assessment & Plan Note (Addendum)
Controlled with hemoglobin A1C reading of 6.5 today.   Continue Glipizide 10 mg by mouth daily and Mounjaro 5 mg injection weekly.   Can try docusate sodium 100 mg 1-2 times daily as needed for constipation. Discussed with patient about increasing fiber and water intake.   Foot exam up to date.  Pnuemonia vaccine up to date.  Eye exam up to-date Urine ACR pending.  Follow up in 6 months.  I evaluated patient, was consulted regarding treatment, and agree with assessment and plan per Tinnie Gens, RN, DNP student.   Allie Bossier, NP-C

## 2022-07-31 NOTE — Assessment & Plan Note (Addendum)
BP reading not at goal.   Discussed with patient to start keeping a BP log and send readings via MyChart in 2 weeks.  I evaluated patient, was consulted regarding treatment, and agree with assessment and plan per Tinnie Gens, RN, DNP student.   Allie Bossier, NP-C

## 2022-07-31 NOTE — Telephone Encounter (Signed)
Noted  

## 2022-07-31 NOTE — Progress Notes (Signed)
Established Patient Office Visit  Subjective   Patient ID: Piccola Arico, female    DOB: 1965/09/22  Age: 57 y.o. MRN: [id number]  Chief Complaint  Patient presents with   Medical Management of Chronic Issues    Diabetes    HPI  Aviya Jarvie is a 57 year old female with past medical history of hypertension, type 2 diabetes, obesity, GAD presents today for a diabetes follow up.   Current medications include: Glipizide 10 mg daily, Mounjaro 5 mg weekly  She is checking her blood glucose once a week and is getting readings of 160s fasting.   Last A1C: 7.3 on 04/23/22 Last Eye Exam: up to date. Last Foot Exam: up to date. Pneumonia Vaccination: 2018 Urine Microalbumin: due Statin: None  Dietary changes since last visit: eats a lot of fast foods with occasional salads. Does not drink any soda or sweetened drinks. She does not eat a lot of desserts.   Exercise: no regular exercise.  She has experiencing constipation after starting Mounjaro. She has tried magnesium citrate OTC once a week.    Patient Active Problem List   Diagnosis Date Noted   Acute hip pain 01/17/2022   Preventative health care 01/17/2022   Hypokalemia 09/38/1829   Umbilical hernia without obstruction and without gangrene 05/17/2021   Chronic left-sided low back pain without sciatica 05/17/2021   Tobacco abuse 01/05/2021   Allergic reaction 11/22/2020   Abdominal mass 04/06/2020   Seasonal allergies 10/10/2018   Abnormal pelvic ultrasound 09/12/2017   Cervical stenosis (uterine cervix) 09/12/2017   GAD (generalized anxiety disorder) 05/04/2016   Insomnia 10/13/2015   Submucous leiomyoma of uterus 08/21/2015   Type 2 diabetes mellitus with hyperglycemia ([place]) 08/05/2015   Hypertension 08/05/2015   Obesity 08/05/2015   H/O: iron deficiency anemia 08/05/2015   Past Medical History:  Diagnosis Date   Abnormal uterine bleeding 08/21/2015   May 2018-spotting associated mild cramping    Acute pain of left knee 10/15/2018   Acute pain of right foot 08/31/2020   Amenorrhea 01/25/2016   Anemia    Arthritis    Complication of anesthesia 2002   DURING SCAR TISSUE EXCISION FROM FALLOPIAN TUBES(2002), PT WAS GIVEN GENERAL ANESTHESIA AND PT BEGAN HAVING A REACTION TO THE ANESTHESIA AND LIPS AND TONGUE STARTED SWELLING AND THE SURGERY HAD TO BE STOPPED DUE TO AIRWAY CLOSING UP-PT HAD GA PRIOR TO THIS FOR HER GALLBLADDER WITH NO PROBLEM PREVIOUSLY   Depression    Diabetes mellitus without complication ([place])    Dysrhythmia    H/O TACHYCARDIA. stress and anxiety brings it on   Family history of adverse reaction to anesthesia    [place] UP   Grief 04/23/2018   Recent loss of husband August 2019-brain aneurysm   Hypertension    Irregular periods    Postop check-status post hysteroscopy/D&C 10/09/2017   Status post endometrial ablation 01/25/2016   Vitamin D deficiency    per patient, no longer a problem   Past Surgical History:  Procedure Laterality Date   CHOLECYSTECTOMY     DILATATION & CURETTAGE/HYSTEROSCOPY WITH MYOSURE N/A 09/30/2017   Procedure: DILATATION & CURETTAGE/HYSTEROSCOPY;  Surgeon: Brayton Mars, MD;  Location: [facility];  Service: Gynecology;  Laterality: N/A;   DILITATION & CURRETTAGE/HYSTROSCOPY WITH NOVASURE ABLATION N/A 09/19/2015   Procedure: DILATATION & CURETTAGE/HYSTEROSCOPY WITH NOVASURE ABLATION;  Surgeon: Brayton Mars, MD;  Location: [facility];  Service: Gynecology;  Laterality: N/A;   INSERTION OF MESH  06/08/2021   Procedure:  INSERTION OF MESH;  Surgeon: Jules Husbands, MD;  Location: [facility];  Service: General;;   JOINT REPLACEMENT Left    30 years ago. knee   KNEE ARTHROSCOPY Left    VENTRAL HERNIA REPAIR N/A 06/08/2021   Procedure: HERNIA REPAIR VENTRAL ADULT, incisional;  Surgeon: Jules Husbands, MD;  Location: [facility];  Service: General;  Laterality: N/A;   Social History   Tobacco Use   Smoking status: Some Days     Packs/day: 0.15    Years: 20.00    Total pack years: 3.00    Types: Cigarettes    Last attempt to quit: 09/05/2015    Years since quitting: 6.9   Smokeless tobacco: Never  Vaping Use   Vaping Use: Never used  Substance Use Topics   Alcohol use: Not Currently    Alcohol/week: 1.0 standard drink of alcohol    Types: 1 Glasses of wine per week    Comment: occasional glass of wine   Drug use: No   Family History  Problem Relation Age of Onset   Cancer Father        throat   Hypertension Mother    Diabetes Paternal Grandmother    Breast cancer Neg Hx    Allergies  Allergen Reactions   Lisinopril Swelling    Lips and face   Losartan Other (See Comments)    Mouth swollen, rash and itching   Wellbutrin [Bupropion] Hives and Swelling    Swelling of mouth   Amlodipine Swelling   Percocet [Oxycodone-Acetaminophen] Anxiety      Review of Systems  Respiratory:  Negative for cough and shortness of breath.   Cardiovascular:  Negative for chest pain.  Gastrointestinal:  Positive for constipation. Negative for nausea.  Genitourinary:  Negative for frequency.  Neurological:  Negative for dizziness and headaches.  Endo/Heme/Allergies:  Negative for polydipsia.      Objective:     BP (!) 138/90   Pulse 75   Temp 98.1 F (36.7 C) (Temporal)   Ht '5\' 9"'$  (1.753 m)   Wt 180 lb (81.6 kg)   SpO2 96%   BMI 26.58 kg/m  BP Readings from Last 3 Encounters:  07/31/22 (!) 138/90  01/17/22 130/84  11/21/21 (!) 137/94   Wt Readings from Last 3 Encounters:  07/31/22 180 lb (81.6 kg)  01/17/22 192 lb (87.1 kg)  11/21/21 194 lb 1.6 oz (88 kg)      Physical Exam Vitals and nursing note reviewed.  Constitutional:      Appearance: Normal appearance.  Cardiovascular:     Rate and Rhythm: Normal rate and regular rhythm.     Pulses: Normal pulses.     Heart sounds: Normal heart sounds.  Pulmonary:     Effort: Pulmonary effort is normal.     Breath sounds: Normal breath sounds.   Neurological:     Mental Status: She is alert and oriented to person, place, and time.      Results for orders placed or performed in visit on 07/31/22  POCT glycosylated hemoglobin (Hb A1C)  Result Value Ref Range   Hemoglobin A1C 6.5 (A) 4.0 - 5.6 %   HbA1c POC (<> result, manual entry)     HbA1c, POC (prediabetic range)     HbA1c, POC (controlled diabetic range)      Last hemoglobin A1c Lab Results  Component Value Date   HGBA1C 6.5 (A) 07/31/2022      The ASCVD Risk score (Arnett DK, et al., 2019)  failed to calculate for the following reasons:   The valid total cholesterol range is 130 to 320 mg/dL    Assessment & Plan:   Problem List Items Addressed This Visit       Cardiovascular and Mediastinum   Hypertension    BP reading not at goal.   Discussed with patient to start keeping a BP log and send readings via MyChart in 2 weeks.  I evaluated patient, was consulted regarding treatment, and agree with assessment and plan per Tinnie Gens, RN, DNP student.   Allie Bossier, NP-C         Endocrine   Type 2 diabetes mellitus with hyperglycemia ([place]) - Primary    Controlled with hemoglobin A1C reading of 6.5 today.   Continue Glipizide 10 mg by mouth daily and Mounjaro 5 mg injection weekly.   Can try docusate sodium 100 mg 1-2 times daily as needed for constipation. Discussed with patient about increasing fiber and water intake.   Foot exam up to date.  Pnuemonia vaccine up to date.  Eye exam up to-date Urine ACR pending.  Follow up in 6 months.  I evaluated patient, was consulted regarding treatment, and agree with assessment and plan per Tinnie Gens, RN, DNP student.   Allie Bossier, NP-C       Relevant Orders   POCT glycosylated hemoglobin (Hb A1C) (Completed)   Microalbumin/Creatinine Ratio, Urine    No follow-ups on file.    Tinnie Gens, BSN-RN, DNP STUDENT

## 2022-07-31 NOTE — Progress Notes (Signed)
Subjective:    Patient ID: Michelle Marshall, female    DOB: 11-Aug-1965, 57 y.o.   MRN: [id number]  HPI  Michelle Marshall is a very pleasant 57 y.o. female with a history of type 2 diabetes, hypertension, iron deficiency anemia, tobacco use, obesity who presents today for follow up of diabetes.  Current medications include: Mounjaro 5 mg weekly, Glipizide Xl 10 mg daily. She has noticed constipation since starting Mounjaro. She will take magnesium citrate once weekly.   She is checking her blood glucose 1 times weekly and is getting readings of 160's-170's  Last A1C: 7.3 in October 2023, 6.5 today Last Eye Exam: UTD per patient Last Foot Exam: UTD Pneumonia Vaccination: 2018 Urine Microalbumin: Due Statin: None, LDL of 47 in May 2023  Dietary changes since last visit: Fast food, occasional salads. No desserts, sodas, teas. She continues to eat potatoes.    Exercise: None.  Wt Readings from Last 3 Encounters:  07/31/22 180 lb (81.6 kg)  01/17/22 192 lb (87.1 kg)  11/21/21 194 lb 1.6 oz (88 kg)    BP Readings from Last 3 Encounters:  07/31/22 (!) 138/90  01/17/22 130/84  11/21/21 (!) 137/94   She does not check her BP at home. She is compliant to all medications as prescribed. She does have a BP cuff at home.    Review of Systems  Respiratory:  Negative for shortness of breath.   Cardiovascular:  Negative for chest pain.  Gastrointestinal:  Positive for constipation.  Endocrine: Negative for polydipsia, polyphagia and polyuria.         Past Medical History:  Diagnosis Date   Abnormal uterine bleeding 08/21/2015   May 2018-spotting associated mild cramping   Acute pain of left knee 10/15/2018   Acute pain of right foot 08/31/2020   Amenorrhea 01/25/2016   Anemia    Arthritis    Complication of anesthesia 2002   DURING SCAR TISSUE EXCISION FROM FALLOPIAN TUBES(2002), PT WAS GIVEN GENERAL ANESTHESIA AND PT BEGAN HAVING A REACTION TO THE ANESTHESIA AND LIPS AND  TONGUE STARTED SWELLING AND THE SURGERY HAD TO BE STOPPED DUE TO AIRWAY CLOSING UP-PT HAD GA PRIOR TO THIS FOR HER GALLBLADDER WITH NO PROBLEM PREVIOUSLY   Depression    Diabetes mellitus without complication ([place])    Dysrhythmia    H/O TACHYCARDIA. stress and anxiety brings it on   Family history of adverse reaction to anesthesia    [place] UP   Grief 04/23/2018   Recent loss of husband August 2019-brain aneurysm   Hypertension    Irregular periods    Postop check-status post hysteroscopy/D&C 10/09/2017   Status post endometrial ablation 01/25/2016   Vitamin D deficiency    per patient, no longer a problem    Social History   Socioeconomic History   Marital status: Widowed    Spouse name: Not on file   Number of children: 1   Years of education: Not on file   Highest education level: Not on file  Occupational History   Not on file  Tobacco Use   Smoking status: Some Days    Packs/day: 0.15    Years: 20.00    Total pack years: 3.00    Types: Cigarettes    Last attempt to quit: 09/05/2015    Years since quitting: 6.9   Smokeless tobacco: Never  Vaping Use   Vaping Use: Never used  Substance and Sexual Activity   Alcohol use: Not Currently    Alcohol/week: 1.0  standard drink of alcohol    Types: 1 Glasses of wine per week    Comment: occasional glass of wine   Drug use: No   Sexual activity: Yes    Birth control/protection: None, Post-menopausal    Comment: ablation  Other Topics Concern   Not on file  Social History Narrative   Married.   1 child.   She works as a Designer, industrial/product   Enjoys shopping, traveling.    Social Determinants of Health   Financial Resource Strain: Not on file  Food Insecurity: Not on file  Transportation Needs: Not on file  Physical Activity: Inactive (05/15/2018)   Exercise Vital Sign    Days of Exercise per Week: 0 days    Minutes of Exercise per Session: 0 min  Stress: Not on file  Social Connections: Not on file   Intimate Partner Violence: Not on file    Past Surgical History:  Procedure Laterality Date   [place] N/A 09/30/2017   Procedure: DILATATION & CURETTAGE/HYSTEROSCOPY;  Surgeon: Brayton Mars, MD;  Location: [facility];  Service: Gynecology;  Laterality: N/A;   DILITATION & CURRETTAGE/HYSTROSCOPY WITH NOVASURE ABLATION N/A 09/19/2015   Procedure: DILATATION & CURETTAGE/HYSTEROSCOPY WITH NOVASURE ABLATION;  Surgeon: Brayton Mars, MD;  Location: [facility];  Service: Gynecology;  Laterality: N/A;   INSERTION OF MESH  06/08/2021   Procedure: INSERTION OF MESH;  Surgeon: Jules Husbands, MD;  Location: [facility];  Service: General;;   JOINT REPLACEMENT Left    30 years ago. knee   KNEE ARTHROSCOPY Left    VENTRAL HERNIA REPAIR N/A 06/08/2021   Procedure: HERNIA REPAIR VENTRAL ADULT, incisional;  Surgeon: Jules Husbands, MD;  Location: [facility];  Service: General;  Laterality: N/A;    Family History  Problem Relation Age of Onset   Cancer Father        throat   Hypertension Mother    Diabetes Paternal Grandmother    Breast cancer Neg Hx     Allergies  Allergen Reactions   Lisinopril Swelling    Lips and face   Losartan Other (See Comments)    Mouth swollen, rash and itching   Wellbutrin [Bupropion] Hives and Swelling    Swelling of mouth   Amlodipine Swelling   Percocet [Oxycodone-Acetaminophen] Anxiety    Current Outpatient Medications on File Prior to Visit  Medication Sig Dispense Refill   carvedilol (COREG) 12.5 MG tablet TAKE 1 TABLET(12.5 MG) BY MOUTH TWICE DAILY WITH A MEAL FOR HEART RATE 180 tablet 2   cyclobenzaprine (FLEXERIL) 5 MG tablet Take 1-2 tablets (5-10 mg total) by mouth 3 (three) times daily as needed for muscle spasms. 30 tablet 0   EPINEPHrine 0.3 mg/0.3 mL IJ SOAJ injection Inject 0.3 mg into the muscle as needed for anaphylaxis.     estradiol-norethindrone (MIMVEY) 1-0.5 MG tablet Take  1 tablet by mouth daily. 90 tablet 3   glipiZIDE (GLUCOTROL XL) 10 MG 24 hr tablet Take 1 tablet (10 mg total) by mouth daily with breakfast. For diabetes. Office visit required for further refills. 30 tablet 0   hydrochlorothiazide (HYDRODIURIL) 25 MG tablet TAKE 1 TABLET BY MOUTH EVERY DAY FOR BLOOD PRESSURE 90 tablet 1   ibuprofen (ADVIL) 800 MG tablet Take 1 tablet (800 mg total) by mouth every 8 (eight) hours as needed for moderate pain. 30 tablet 0   Multiple Vitamin (MULTIVITAMIN) tablet Take 1 tablet by mouth daily.  potassium chloride SA (KLOR-CON M20) 20 MEQ tablet TAKE 1 TABLET (20 MEQ TOTAL) BY MOUTH DAILY. FOR LOW POTASSIUM. 90 tablet 2   sertraline (ZOLOFT) 100 MG tablet TAKE 1 TABLET(100 MG) BY MOUTH DAILY for anxiety. Take with 25 mg dose. 90 tablet 1   sertraline (ZOLOFT) 25 MG tablet TAKE 1 TABLET BY MOUTH DAILY. FOR ANXIETY. TAKE WITH 100 MG TABLET. 90 tablet 2   tirzepatide (MOUNJARO) 5 MG/0.5ML Pen Inject 5 mg into the skin once a week. for diabetes. 2 mL 0   traZODone (DESYREL) 150 MG tablet TAKE 1 TABLET BY MOUTH EVERY DAY AT BEDTIME FOR SLEEP 90 tablet 2   No current facility-administered medications on file prior to visit.    BP (!) 138/90   Pulse 75   Temp 98.1 F (36.7 C) (Temporal)   Ht '5\' 9"'$  (1.753 m)   Wt 180 lb (81.6 kg)   SpO2 96%   BMI 26.58 kg/m  Objective:   Physical Exam Cardiovascular:     Rate and Rhythm: Normal rate and regular rhythm.  Pulmonary:     Effort: Pulmonary effort is normal.     Breath sounds: Normal breath sounds.  Musculoskeletal:     Cervical back: Neck supple.  Skin:    General: Skin is warm and dry.           Assessment & Plan:  Type 2 diabetes mellitus with hyperglycemia, without long-term current use of insulin (HCC) Assessment & Plan: Controlled with hemoglobin A1C reading of 6.5 today.   Continue Glipizide 10 mg by mouth daily and Mounjaro 5 mg injection weekly.   Can try docusate sodium 100 mg 1-2 times  daily as needed for constipation. Discussed with patient about increasing fiber and water intake.   Foot exam up to date.  Pnuemonia vaccine up to date.  Eye exam up to-date Urine ACR pending.  Follow up in 6 months.  I evaluated patient, was consulted regarding treatment, and agree with assessment and plan per Tinnie Gens, RN, DNP student.   Allie Bossier, NP-C   Orders: -     POCT glycosylated hemoglobin (Hb A1C) -     Microalbumin / creatinine urine ratio  Primary hypertension Assessment & Plan: BP reading not at goal.   Discussed with patient to start keeping a BP log and send readings via MyChart in 2 weeks.  I evaluated patient, was consulted regarding treatment, and agree with assessment and plan per Tinnie Gens, RN, DNP student.   Allie Bossier, NP-C          Pleas Koch, NP

## 2022-07-31 NOTE — Patient Instructions (Addendum)
Please start keeping a BP log and update me in two weeks.  Continue current medications for diabetes. Follow up in 6 months.  Can take docusate sodium 100 mg 1-2 times daily for constipation. Increase your fiber intake and water intake.  It was a pleasure to see you today!

## 2022-08-03 ENCOUNTER — Other Ambulatory Visit: Payer: Self-pay | Admitting: Primary Care

## 2022-08-03 DIAGNOSIS — E1165 Type 2 diabetes mellitus with hyperglycemia: Secondary | ICD-10-CM

## 2022-08-03 MED ORDER — TIRZEPATIDE 5 MG/0.5ML ~~LOC~~ SOAJ: 5.0000 mg | SUBCUTANEOUS | 1 refills | Status: DC

## 2022-08-03 NOTE — Telephone Encounter (Signed)
From: Franchot Heidelberg To: Office of Pleas Koch, NP Sent: 08/03/2022 11:03 AM EST Subject: Medication Renewal Request  Refills have been requested for the following medications:   tirzepatide Darcel Bayley) 5 MG/0.5ML Pen [Carola Viramontes K Adyline Huberty]  [id number] NLurayDelivery method: PArlyss Gandy

## 2022-09-21 ENCOUNTER — Other Ambulatory Visit: Payer: Self-pay | Admitting: Primary Care

## 2022-09-21 DIAGNOSIS — E1165 Type 2 diabetes mellitus with hyperglycemia: Secondary | ICD-10-CM

## 2022-09-21 DIAGNOSIS — I10 Essential (primary) hypertension: Secondary | ICD-10-CM

## 2022-09-21 MED ORDER — TIRZEPATIDE 7.5 MG/0.5ML ~~LOC~~ SOAJ: 7.5000 mg | SUBCUTANEOUS | 0 refills | Status: DC

## 2022-10-03 ENCOUNTER — Ambulatory Visit (INDEPENDENT_AMBULATORY_CARE_PROVIDER_SITE_OTHER): Payer: 59 | Admitting: Primary Care

## 2022-10-03 ENCOUNTER — Encounter: Payer: Self-pay | Admitting: Primary Care

## 2022-10-03 VITALS — BP 136/80 | HR 88 | Temp 98.2°F | Ht 69.0 in | Wt 181.0 lb

## 2022-10-03 DIAGNOSIS — R051 Acute cough: Secondary | ICD-10-CM | POA: Diagnosis not present

## 2022-10-03 MED ORDER — PREDNISONE 20 MG PO TABS: 40.0000 mg | ORAL_TABLET | Freq: Every day | ORAL | 0 refills | Status: AC

## 2022-10-03 MED ORDER — HYDROCOD POLI-CHLORPHE POLI ER 10-8 MG/5ML PO SUER: 5.0000 mL | Freq: Two times a day (BID) | ORAL | 0 refills | Status: DC | PRN

## 2022-10-03 MED ORDER — AZITHROMYCIN 250 MG PO TABS: ORAL_TABLET | ORAL | 0 refills | Status: AC

## 2022-10-03 NOTE — Assessment & Plan Note (Addendum)
Differentials include COPD exacerbation, viral/bacterial upper respiratory infection  Given presentation today and length of symptoms, will treat.   Start Prednisone 40 mg two tablets in the morning once a day.  Start Azithromycin 250 mg 2 tablets on day one and one there after for five days.   Start Tussionex 5 ml HS as needed.   She will update if her symptoms do not improve or worsen.   I evaluated patient, was consulted regarding treatment, and agree with assessment and plan per Tinnie Gens, RN, DNP student.   Allie Bossier, NP-C

## 2022-10-03 NOTE — Progress Notes (Signed)
Established Patient Office Visit  Subjective   Patient ID: Michelle Marshall, female    DOB: [birth year]  Age: 57 y.o. MRN: [id number]  Chief Complaint  Patient presents with   Cough    X1 week Productive cough w green sputum, congested, headache Was tested for covid last week with clinic at work and it was negative.  Declines body aches or fever     Cough Associated symptoms include headaches and wheezing. Pertinent negatives include no chest pain, chills, ear pain, fever, sore throat or shortness of breath.    Michelle Marshall is a 57 year old female with past medical history of HTN, type 2 diabetes, GAD, tobacco abuse, seasonal allergies presents today to discuss nasal congestion.   She sent a mychart message on 10/01/22 that she has had a cough for a week and was not getting better. She [facility] and was prescribed Tessalon Perles. She was tested for Covid at work last week and it was negative. She declines any body aches or fever.   Today, she reports that her symptoms started on 09/26/22 and was tested for covid on 09/26/22. She reports that her symptoms are about the same or maybe a little worse. She is coughing up green sputum, runny nose, congestion and headache. Her cough is worse when she lays down.   She has been taking an antihistamine that was recommended last week once daily. She has not seen any improvement. She has not tried any OTC cough syrup. She denies any fevers or chills.   Patient Active Problem List   Diagnosis Date Noted   Acute cough 10/03/2022   Acute hip pain 01/17/2022   Preventative health care 01/17/2022   Hypokalemia [id number]   Umbilical hernia without obstruction and without gangrene 05/17/2021   Chronic left-sided low back pain without sciatica 05/17/2021   Tobacco abuse 01/05/2021   Allergic reaction 11/22/2020   Abdominal mass 04/06/2020   Seasonal allergies 10/10/2018   Abnormal pelvic ultrasound  09/12/2017   Cervical stenosis (uterine cervix) 09/12/2017   GAD (generalized anxiety disorder) 05/04/2016   Insomnia 10/13/2015   Submucous leiomyoma of uterus 08/21/2015   Type 2 diabetes mellitus with hyperglycemia ([place]) 08/05/2015   Hypertension 08/05/2015   Obesity 08/05/2015   H/O: iron deficiency anemia 08/05/2015   Past Medical History:  Diagnosis Date   Abnormal uterine bleeding 08/21/2015   May 2018-spotting associated mild cramping   Acute pain of left knee 10/15/2018   Acute pain of right foot 08/31/2020   Amenorrhea 01/25/2016   Anemia    Arthritis    Complication of anesthesia 2002   DURING SCAR TISSUE EXCISION FROM FALLOPIAN TUBES(2002), PT WAS GIVEN GENERAL ANESTHESIA AND PT BEGAN HAVING A REACTION TO THE ANESTHESIA AND LIPS AND TONGUE STARTED SWELLING AND THE SURGERY HAD TO BE STOPPED DUE TO AIRWAY CLOSING UP-PT HAD GA PRIOR TO THIS FOR HER GALLBLADDER WITH NO PROBLEM PREVIOUSLY   Depression    Diabetes mellitus without complication ([place])    Dysrhythmia    H/O TACHYCARDIA. stress and anxiety brings it on   Family history of adverse reaction to anesthesia    [place] UP   Grief 04/23/2018   Recent loss of husband August 2019-brain aneurysm   Hypertension    Irregular periods    Postop check-status post hysteroscopy/D&C 10/09/2017   Status post endometrial ablation 01/25/2016   Vitamin D deficiency    per patient, no longer a problem  Past Surgical History:  Procedure Laterality Date   CHOLECYSTECTOMY     DILATATION & CURETTAGE/HYSTEROSCOPY WITH MYOSURE N/A 09/30/2017   Procedure: DILATATION & CURETTAGE/HYSTEROSCOPY;  Surgeon: Defrancesco, Alanda Slim, MD;  Location: [facility];  Service: Gynecology;  Laterality: N/A;   DILITATION & CURRETTAGE/HYSTROSCOPY WITH NOVASURE ABLATION N/A 09/19/2015   Procedure: DILATATION & CURETTAGE/HYSTEROSCOPY WITH NOVASURE ABLATION;  Surgeon: Brayton Mars, MD;  Location: [facility];  Service: Gynecology;  Laterality:  N/A;   INSERTION OF MESH  06/08/2021   Procedure: INSERTION OF MESH;  Surgeon: Jules Husbands, MD;  Location: [facility];  Service: General;;   JOINT REPLACEMENT Left    30 years ago. knee   KNEE ARTHROSCOPY Left    VENTRAL HERNIA REPAIR N/A 06/08/2021   Procedure: HERNIA REPAIR VENTRAL ADULT, incisional;  Surgeon: Jules Husbands, MD;  Location: [facility];  Service: General;  Laterality: N/A;   Social History   Tobacco Use   Smoking status: Some Days    Packs/day: 0.15    Years: 20.00    Additional pack years: 0.00    Total pack years: 3.00    Types: Cigarettes    Last attempt to quit: 09/05/2015    Years since quitting: 7.0   Smokeless tobacco: Never  Vaping Use   Vaping Use: Never used  Substance Use Topics   Alcohol use: Not Currently    Alcohol/week: 1.0 standard drink of alcohol    Types: 1 Glasses of wine per week    Comment: occasional glass of wine   Drug use: No   Family History  Problem Relation Age of Onset   Cancer Father        throat   Hypertension Mother    Diabetes Paternal Grandmother    Breast cancer Neg Hx    Allergies  Allergen Reactions   Lisinopril Swelling    Lips and face   Losartan Other (See Comments)    Mouth swollen, rash and itching   Wellbutrin [Bupropion] Hives and Swelling    Swelling of mouth   Amlodipine Swelling   Percocet [Oxycodone-Acetaminophen] Anxiety      Review of Systems  Constitutional:  Negative for chills and fever.  HENT:  Positive for congestion and sinus pain. Negative for ear pain and sore throat.        Ear fullness   Eyes:  Negative for blurred vision.  Respiratory:  Positive for cough, sputum production and wheezing. Negative for shortness of breath.   Cardiovascular:  Negative for chest pain.  Neurological:  Positive for headaches. Negative for dizziness.      Objective:     BP 136/80   Pulse 88   Temp 98.2 F (36.8 C) (Temporal)   Ht 5\' 9"  (1.753 m)   Wt 181 lb (82.1 kg)   SpO2 95%   BMI 26.73  kg/m  BP Readings from Last 3 Encounters:  10/03/22 136/80  07/31/22 (!) 138/90  01/17/22 130/84   Wt Readings from Last 3 Encounters:  10/03/22 181 lb (82.1 kg)  07/31/22 180 lb (81.6 kg)  01/17/22 192 lb (87.1 kg)      Physical Exam Vitals and nursing note reviewed.  Constitutional:      Appearance: Normal appearance.  HENT:     Right Ear: Tympanic membrane, ear canal and external ear normal.     Left Ear: Tympanic membrane, ear canal and external ear normal.  Cardiovascular:     Rate and Rhythm: Normal rate and regular rhythm.  Pulses: Normal pulses.     Heart sounds: Normal heart sounds.  Pulmonary:     Effort: Pulmonary effort is normal.     Breath sounds: Normal breath sounds.  Skin:    General: Skin is warm.  Neurological:     Mental Status: She is alert and oriented to person, place, and time.  Psychiatric:        Mood and Affect: Mood normal.        Behavior: Behavior normal.      No results found for any visits on 10/03/22.     The ASCVD Risk score (Arnett DK, et al., 2019) failed to calculate for the following reasons:   The valid total cholesterol range is 130 to 320 mg/dL    Assessment & Plan:   Problem List Items Addressed This Visit       Other   Acute cough - Primary    Differentials include COPD exacerbation, viral upper respiratory infection  Given presentation today and length of symptoms, will treat.   Start Prednisone 40 mg two tablets in the morning once a day.   Start Azithromycin 250 mg 2 tablets on day one and one there after for five days.   Start Tussionex 5 ml HS as needed.   She will update if her symptoms do not improve or worsen.       Relevant Medications   predniSONE (DELTASONE) 20 MG tablet   azithromycin (ZITHROMAX) 250 MG tablet   chlorpheniramine-HYDROcodone (TUSSIONEX) 10-8 MG/5ML    No follow-ups on file.    Tinnie Gens, BSN-RN, DNP STUDENT

## 2022-10-03 NOTE — Patient Instructions (Addendum)
Start Prednisone 40 mg two tablets in the morning once a day.   Start Azithromycin 250 mg 2 tablets on day one and one there after for five days.   Start Tussinex 5 ml at bed time as needed.   Please update if your symptoms do not start to improve or worsen.   It was a pleasure to see you today!

## 2022-10-03 NOTE — Progress Notes (Signed)
Subjective:    Patient ID: Michelle Marshall, female    DOB: 1965/10/25, 57 y.o.   MRN: [id number]  Cough Associated symptoms include headaches. Pertinent negatives include no chest pain, fever or sore throat.    Michelle Marshall is a very pleasant 57 y.o. female with a history of hypertension, type 2 diabetes, iron deficiency, GAD, seasonal allergies, tobacco use who presents today to discuss cough.   Evaluated at [facility] on 09/26/22 for an acute cough (# of days not documented) with nasal congestion. She was diagnosed with bronchitis and treated with Tessalon Perles 100 mg TID PRN.   Today she mentions that symptom onset was 7 days ago with cough, headaches, and congestion. Her cough is productive with green sputum. Cough is worse when laying down at night. She's been taking an OTC antihistamine without improvement.   She denies fevers. She tested negative for Covid-19 infection at the time.    Review of Systems  Constitutional:  Negative for fever.  HENT:  Positive for congestion and sinus pain. Negative for sore throat.        Ear fullness  Eyes:  Negative for visual disturbance.  Respiratory:  Positive for cough.   Cardiovascular:  Negative for chest pain.  Neurological:  Positive for headaches.         Past Medical History:  Diagnosis Date   Abnormal uterine bleeding 08/21/2015   May 2018-spotting associated mild cramping   Acute pain of left knee 10/15/2018   Acute pain of right foot 08/31/2020   Amenorrhea 01/25/2016   Anemia    Arthritis    Complication of anesthesia 2002   DURING SCAR TISSUE EXCISION FROM FALLOPIAN TUBES(2002), PT WAS GIVEN GENERAL ANESTHESIA AND PT BEGAN HAVING A REACTION TO THE ANESTHESIA AND LIPS AND TONGUE STARTED SWELLING AND THE SURGERY HAD TO BE STOPPED DUE TO AIRWAY CLOSING UP-PT HAD GA PRIOR TO THIS FOR HER GALLBLADDER WITH NO PROBLEM PREVIOUSLY   Depression    Diabetes mellitus without complication ([place])    Dysrhythmia     H/O TACHYCARDIA. stress and anxiety brings it on   Family history of adverse reaction to anesthesia    [place]   Grief 04/23/2018   Recent loss of husband August 2019-brain aneurysm   Hypertension    Irregular periods    Postop check-status post hysteroscopy/D&C 10/09/2017   Status post endometrial ablation 01/25/2016   Vitamin D deficiency    per patient, no longer a problem    Social History   Socioeconomic History   Marital status: Widowed    Spouse name: Not on file   Number of children: 1   Years of education: Not on file   Highest education level: Not on file  Occupational History   Not on file  Tobacco Use   Smoking status: Some Days    Packs/day: 0.15    Years: 20.00    Additional pack years: 0.00    Total pack years: 3.00    Types: Cigarettes    Last attempt to quit: 09/05/2015    Years since quitting: 7.0   Smokeless tobacco: Never  Vaping Use   Vaping Use: Never used  Substance and Sexual Activity   Alcohol use: Not Currently    Alcohol/week: 1.0 standard drink of alcohol    Types: 1 Glasses of wine per week    Comment: occasional glass of wine   Drug use: No   Sexual activity: Yes    Birth control/protection: None,  Post-menopausal    Comment: ablation  Other Topics Concern   Not on file  Social History Narrative   Married.   1 child.   She works as a Designer, industrial/product   Enjoys shopping, traveling.    Social Determinants of Health   Financial Resource Strain: Not on file  Food Insecurity: Not on file  Transportation Needs: Not on file  Physical Activity: Inactive (05/15/2018)   Exercise Vital Sign    Days of Exercise per Week: 0 days    Minutes of Exercise per Session: 0 min  Stress: Not on file  Social Connections: Not on file  Intimate Partner Violence: Not on file    Past Surgical History:  Procedure Laterality Date   [place] N/A 09/30/2017   Procedure: DILATATION  & CURETTAGE/HYSTEROSCOPY;  Surgeon: Brayton Mars, MD;  Location: [facility];  Service: Gynecology;  Laterality: N/A;   DILITATION & CURRETTAGE/HYSTROSCOPY WITH NOVASURE ABLATION N/A 09/19/2015   Procedure: DILATATION & CURETTAGE/HYSTEROSCOPY WITH NOVASURE ABLATION;  Surgeon: Brayton Mars, MD;  Location: [facility];  Service: Gynecology;  Laterality: N/A;   INSERTION OF MESH  06/08/2021   Procedure: INSERTION OF MESH;  Surgeon: Jules Husbands, MD;  Location: [facility];  Service: General;;   JOINT REPLACEMENT Left    30 years ago. knee   KNEE ARTHROSCOPY Left    VENTRAL HERNIA REPAIR N/A 06/08/2021   Procedure: HERNIA REPAIR VENTRAL ADULT, incisional;  Surgeon: Jules Husbands, MD;  Location: [facility];  Service: General;  Laterality: N/A;    Family History  Problem Relation Age of Onset   Cancer Father        throat   Hypertension Mother    Diabetes Paternal Grandmother    Breast cancer Neg Hx     Allergies  Allergen Reactions   Lisinopril Swelling    Lips and face   Losartan Other (See Comments)    Mouth swollen, rash and itching   Wellbutrin [Bupropion] Hives and Swelling    Swelling of mouth   Amlodipine Swelling   Percocet [Oxycodone-Acetaminophen] Anxiety    Current Outpatient Medications on File Prior to Visit  Medication Sig Dispense Refill   carvedilol (COREG) 12.5 MG tablet TAKE 1 TABLET(12.5 MG) BY MOUTH TWICE DAILY WITH A MEAL FOR HEART RATE 180 tablet 0   cyclobenzaprine (FLEXERIL) 5 MG tablet Take 1-2 tablets (5-10 mg total) by mouth 3 (three) times daily as needed for muscle spasms. 30 tablet 0   EPINEPHrine 0.3 mg/0.3 mL IJ SOAJ injection Inject 0.3 mg into the muscle as needed for anaphylaxis.     estradiol-norethindrone (MIMVEY) 1-0.5 MG tablet Take 1 tablet by mouth daily. 90 tablet 3   glipiZIDE (GLUCOTROL XL) 10 MG 24 hr tablet Take 1 tablet (10 mg total) by mouth daily with breakfast. For diabetes. Office visit required for further refills. 30 tablet  0   hydrochlorothiazide (HYDRODIURIL) 25 MG tablet TAKE 1 TABLET BY MOUTH EVERY DAY FOR BLOOD PRESSURE 90 tablet 1   ibuprofen (ADVIL) 800 MG tablet Take 1 tablet (800 mg total) by mouth every 8 (eight) hours as needed for moderate pain. 30 tablet 0   Multiple Vitamin (MULTIVITAMIN) tablet Take 1 tablet by mouth daily.     potassium chloride SA (KLOR-CON M20) 20 MEQ tablet TAKE 1 TABLET (20 MEQ TOTAL) BY MOUTH DAILY. FOR LOW POTASSIUM. 90 tablet 2   sertraline (ZOLOFT) 100 MG tablet TAKE 1 TABLET(100 MG) BY  MOUTH DAILY for anxiety. Take with 25 mg dose. 90 tablet 1   sertraline (ZOLOFT) 25 MG tablet TAKE 1 TABLET BY MOUTH DAILY. FOR ANXIETY. TAKE WITH 100 MG TABLET. 90 tablet 2   tirzepatide (MOUNJARO) 7.5 MG/0.5ML Pen Inject 7.5 mg into the skin once a week. for diabetes. 6 mL 0   traZODone (DESYREL) 150 MG tablet TAKE 1 TABLET BY MOUTH EVERY DAY AT BEDTIME FOR SLEEP 90 tablet 2   No current facility-administered medications on file prior to visit.    BP 136/80   Pulse 88   Temp 98.2 F (36.8 C) (Temporal)   Ht 5\' 9"  (1.753 m)   Wt 181 lb (82.1 kg)   SpO2 95%   BMI 26.73 kg/m  Objective:   Physical Exam HENT:     Nose:     Right Sinus: No maxillary sinus tenderness or frontal sinus tenderness.     Left Sinus: No maxillary sinus tenderness or frontal sinus tenderness.     Mouth/Throat:     Pharynx: No posterior oropharyngeal erythema.  Eyes:     Conjunctiva/sclera: Conjunctivae normal.  Cardiovascular:     Rate and Rhythm: Normal rate and regular rhythm.  Pulmonary:     Effort: Pulmonary effort is normal.     Breath sounds: Normal breath sounds. No wheezing or rales.     Comments: Dry cough noted during exam Musculoskeletal:     Cervical back: Neck supple.  Lymphadenopathy:     Cervical: No cervical adenopathy.  Skin:    General: Skin is warm and dry.           Assessment & Plan:  Acute cough Assessment & Plan: Differentials include COPD exacerbation,  viral/bacterial upper respiratory infection  Given presentation today and length of symptoms, will treat.   Start Prednisone 40 mg two tablets in the morning once a day.  Start Azithromycin 250 mg 2 tablets on day one and one there after for five days.   Start Tussionex 5 ml HS as needed.   She will update if her symptoms do not improve or worsen.   I evaluated patient, was consulted regarding treatment, and agree with assessment and plan per Tinnie Gens, RN, DNP student.   Allie Bossier, NP-C   Orders: -     predniSONE; Take 2 tablets (40 mg total) by mouth daily with breakfast for 5 days. For cough  Dispense: 10 tablet; Refill: 0 -     Azithromycin; Take 2 tablets on day 1, then 1 tablet daily on days 2 through 5 for cough.  Dispense: 6 tablet; Refill: 0 -     Hydrocod Poli-Chlorphe Poli ER; Take 5 mLs by mouth every 12 (twelve) hours as needed for cough (cough, will cause drowsiness.).  Dispense: 50 mL; Refill: 0        Pleas Koch, NP

## 2022-10-26 DIAGNOSIS — E1165 Type 2 diabetes mellitus with hyperglycemia: Secondary | ICD-10-CM

## 2022-10-26 MED ORDER — OZEMPIC (0.25 OR 0.5 MG/DOSE) 2 MG/3ML ~~LOC~~ SOPN
0.5000 mg | PEN_INJECTOR | SUBCUTANEOUS | 0 refills | Status: DC
Start: 2022-10-26 — End: 2022-11-15

## 2022-11-15 ENCOUNTER — Other Ambulatory Visit: Payer: Self-pay | Admitting: Primary Care

## 2022-11-15 DIAGNOSIS — E1165 Type 2 diabetes mellitus with hyperglycemia: Secondary | ICD-10-CM

## 2022-11-15 MED ORDER — SEMAGLUTIDE (1 MG/DOSE) 4 MG/3ML ~~LOC~~ SOPN
1.0000 mg | PEN_INJECTOR | SUBCUTANEOUS | 0 refills | Status: DC
Start: 2022-11-15 — End: 2023-01-21

## 2022-11-26 ENCOUNTER — Other Ambulatory Visit: Payer: Self-pay | Admitting: Primary Care

## 2022-11-26 DIAGNOSIS — F411 Generalized anxiety disorder: Secondary | ICD-10-CM

## 2022-11-29 ENCOUNTER — Encounter: Payer: 59 | Admitting: Obstetrics and Gynecology

## 2022-11-29 DIAGNOSIS — Z01419 Encounter for gynecological examination (general) (routine) without abnormal findings: Secondary | ICD-10-CM

## 2022-11-29 DIAGNOSIS — Z79890 Hormone replacement therapy (postmenopausal): Secondary | ICD-10-CM

## 2022-11-29 DIAGNOSIS — Z1231 Encounter for screening mammogram for malignant neoplasm of breast: Secondary | ICD-10-CM

## 2022-12-06 ENCOUNTER — Ambulatory Visit: Payer: 59 | Admitting: Primary Care

## 2022-12-16 ENCOUNTER — Other Ambulatory Visit: Payer: Self-pay | Admitting: Primary Care

## 2022-12-16 DIAGNOSIS — I10 Essential (primary) hypertension: Secondary | ICD-10-CM

## 2022-12-16 DIAGNOSIS — E876 Hypokalemia: Secondary | ICD-10-CM

## 2022-12-19 ENCOUNTER — Other Ambulatory Visit: Payer: Self-pay | Admitting: Primary Care

## 2022-12-19 DIAGNOSIS — G4700 Insomnia, unspecified: Secondary | ICD-10-CM

## 2023-01-09 ENCOUNTER — Other Ambulatory Visit: Payer: Self-pay | Admitting: Obstetrics and Gynecology

## 2023-01-09 DIAGNOSIS — N951 Menopausal and female climacteric states: Secondary | ICD-10-CM

## 2023-01-15 ENCOUNTER — Other Ambulatory Visit: Payer: Self-pay | Admitting: Primary Care

## 2023-01-15 DIAGNOSIS — I10 Essential (primary) hypertension: Secondary | ICD-10-CM

## 2023-01-21 ENCOUNTER — Encounter: Payer: Self-pay | Admitting: Primary Care

## 2023-01-21 ENCOUNTER — Ambulatory Visit: Payer: 59 | Admitting: Primary Care

## 2023-01-21 VITALS — BP 118/78 | HR 88 | Temp 98.2°F | Ht 69.0 in | Wt 184.4 lb

## 2023-01-21 DIAGNOSIS — F411 Generalized anxiety disorder: Secondary | ICD-10-CM | POA: Diagnosis not present

## 2023-01-21 DIAGNOSIS — E876 Hypokalemia: Secondary | ICD-10-CM

## 2023-01-21 DIAGNOSIS — Z1211 Encounter for screening for malignant neoplasm of colon: Secondary | ICD-10-CM

## 2023-01-21 DIAGNOSIS — G8929 Other chronic pain: Secondary | ICD-10-CM

## 2023-01-21 DIAGNOSIS — I10 Essential (primary) hypertension: Secondary | ICD-10-CM | POA: Diagnosis not present

## 2023-01-21 DIAGNOSIS — F5102 Adjustment insomnia: Secondary | ICD-10-CM

## 2023-01-21 DIAGNOSIS — M545 Low back pain, unspecified: Secondary | ICD-10-CM

## 2023-01-21 DIAGNOSIS — E1165 Type 2 diabetes mellitus with hyperglycemia: Secondary | ICD-10-CM | POA: Diagnosis not present

## 2023-01-21 DIAGNOSIS — Z0001 Encounter for general adult medical examination with abnormal findings: Secondary | ICD-10-CM | POA: Diagnosis not present

## 2023-01-21 LAB — COMPREHENSIVE METABOLIC PANEL
ALT: 45 U/L — ABNORMAL HIGH (ref 0–35)
AST: 32 U/L (ref 0–37)
Albumin: 4.4 g/dL (ref 3.5–5.2)
Alkaline Phosphatase: 33 U/L — ABNORMAL LOW (ref 39–117)
BUN: 20 mg/dL (ref 6–23)
CO2: 27 mEq/L (ref 19–32)
Calcium: 10.2 mg/dL (ref 8.4–10.5)
Chloride: 101 mEq/L (ref 96–112)
Creatinine, Ser: 0.79 mg/dL (ref 0.40–1.20)
GFR: 83.94 mL/min (ref >60.00)
Glucose, Bld: 150 mg/dL — ABNORMAL HIGH (ref 70–99)
Potassium: 3.6 mEq/L (ref 3.5–5.1)
Sodium: 137 mEq/L (ref 135–145)
Total Bilirubin: 0.5 mg/dL (ref 0.2–1.2)
Total Protein: 7.1 g/dL (ref 6.0–8.3)

## 2023-01-21 LAB — LIPID PANEL
Cholesterol: 118 mg/dL (ref 0–200)
HDL: 58.5 mg/dL (ref >39.00)
LDL Cholesterol: 42 mg/dL (ref 0–99)
NonHDL: 59.06
Total CHOL/HDL Ratio: 2
Triglycerides: 87 mg/dL (ref 0.0–149.0)
VLDL: 17.4 mg/dL (ref 0.0–40.0)

## 2023-01-21 LAB — HEMOGLOBIN A1C: Hgb A1c MFr Bld: 6.6 % — ABNORMAL HIGH (ref 4.6–6.5)

## 2023-01-21 MED ORDER — SEMAGLUTIDE (2 MG/DOSE) 8 MG/3ML ~~LOC~~ SOPN: 2.0000 mg | PEN_INJECTOR | SUBCUTANEOUS | 0 refills | Status: DC

## 2023-01-21 MED ORDER — BUSPIRONE HCL 5 MG PO TABS
5.0000 mg | ORAL_TABLET | Freq: Two times a day (BID) | ORAL | 0 refills | Status: DC
Start: 2023-01-21 — End: 2023-02-12

## 2023-01-21 NOTE — Assessment & Plan Note (Signed)
Deteriorated.  Discussed options. Continue Zoloft 125 mg daily and trazodone 150 mg at bedtime.  Add buspirone 5 mg once to twice daily. She will update.

## 2023-01-21 NOTE — Progress Notes (Signed)
Subjective:    Patient ID: Michelle Marshall, female    DOB: 03/16/1966, 57 y.o.   MRN: [id number]  HPI  Michelle Marshall is a very pleasant 57 y.o. female who presents today for complete physical and follow up of chronic conditions.  She would like to increase her Ozempic to 2 mg weekly for food cravings and weight stagnation. She does notice intermittent weakness and feeling jittery. She is managed on glipizide XL 10 mg daily.   She is also wanting to discuss anxiety/depression. Symptoms began about 2-3 months ago which include feeling nervous and worrying quite a bit. She dreads going home each day. She's been under a lot of stress with her daughter and her aunt. She is managed on Zoloft 125 mg which has helped historically.  She is also managed on trazodone 150 mg at bedtime which helps tremendously with sleep.  Immunizations: -Tetanus: Completed in 2021 -Shingles: Completed Shingrix series -Pneumonia: Completed in 2018  Diet: Fair diet.  Exercise: No regular exercise.   Eye exam: Completes annually  Dental exam: Completes semi-annually    Pap Smear: 2023 Mammogram: Completed years ago. She will schedule her GYN.   Colonoscopy: Never completed colonoscopy. She never completed Cologuard last year.   BP Readings from Last 3 Encounters:  01/21/23 118/78  10/03/22 136/80  07/31/22 (!) 138/90   Wt Readings from Last 3 Encounters:  01/21/23 184 lb 6.4 oz (83.6 kg)  10/03/22 181 lb (82.1 kg)  07/31/22 180 lb (81.6 kg)      Review of Systems  Constitutional:  Negative for unexpected weight change.  HENT:  Negative for rhinorrhea.   Respiratory:  Negative for cough and shortness of breath.   Cardiovascular:  Negative for chest pain.  Gastrointestinal:  Negative for constipation and diarrhea.  Genitourinary:  Negative for difficulty urinating.  Musculoskeletal:  Negative for arthralgias and myalgias.  Skin:  Negative for rash.  Allergic/Immunologic: Negative for  environmental allergies.  Neurological:  Negative for dizziness and headaches.  Psychiatric/Behavioral:  Negative for sleep disturbance. The patient is nervous/anxious.          Past Medical History:  Diagnosis Date   Abdominal mass 04/06/2020   Abnormal uterine bleeding 08/21/2015   May 2018-spotting associated mild cramping   Acute pain of left knee 10/15/2018   Acute pain of right foot 08/31/2020   Amenorrhea 01/25/2016   Anemia    Arthritis    Complication of anesthesia 2002   DURING SCAR TISSUE EXCISION FROM FALLOPIAN TUBES(2002), PT WAS GIVEN GENERAL ANESTHESIA AND PT BEGAN HAVING A REACTION TO THE ANESTHESIA AND LIPS AND TONGUE STARTED SWELLING AND THE SURGERY HAD TO BE STOPPED DUE TO AIRWAY CLOSING UP-PT HAD GA PRIOR TO THIS FOR HER GALLBLADDER WITH NO PROBLEM PREVIOUSLY   Depression    Diabetes mellitus without complication ([facility])    Dysrhythmia    H/O TACHYCARDIA. stress and anxiety brings it on   Family history of adverse reaction to anesthesia    BROTHER-HARD TIME WAKING UP   Grief 04/23/2018   Recent loss of husband August 2019-brain aneurysm   Hypertension    Irregular periods    Postop check-status post hysteroscopy/D&C 10/09/2017   Status post endometrial ablation 01/25/2016   Vitamin D deficiency    per patient, no longer a problem    Social History   Socioeconomic History   Marital status: Widowed    Spouse name: Not on file   Number of children: 1   Years of education: Not on  file   Highest education level: Not on file  Occupational History   Not on file  Tobacco Use   Smoking status: Some Days    Current packs/day: 0.00    Average packs/day: 0.2 packs/day for 20.0 years (3.0 ttl pk-yrs)    Types: Cigarettes    Start date: 09/05/1995    Last attempt to quit: 09/05/2015    Years since quitting: 7.3   Smokeless tobacco: Never  Vaping Use   Vaping status: Never Used  Substance and Sexual Activity   Alcohol use: Not Currently    Alcohol/week: 1.0  standard drink of alcohol    Types: 1 Glasses of wine per week    Comment: occasional glass of wine   Drug use: No   Sexual activity: Yes    Birth control/protection: None, Post-menopausal    Comment: ablation  Other Topics Concern   Not on file  Social History Narrative   Married.   1 child.   She works as a Chartered loss adjuster   Enjoys shopping, traveling.    Social Determinants of Health   Financial Resource Strain: Not on file  Food Insecurity: Not on file  Transportation Needs: Not on file  Physical Activity: Inactive (05/15/2018)   Exercise Vital Sign    Days of Exercise per Week: 0 days    Minutes of Exercise per Session: 0 min  Stress: Not on file  Social Connections: Not on file  Intimate Partner Violence: Not on file    Past Surgical History:  Procedure Laterality Date   CHOLECYSTECTOMY     DILATATION & CURETTAGE/HYSTEROSCOPY WITH MYOSURE N/A 09/30/2017   Procedure: DILATATION & CURETTAGE/HYSTEROSCOPY;  Surgeon: Herold Harms, MD;  Location: [facility];  Service: Gynecology;  Laterality: N/A;   DILITATION & CURRETTAGE/HYSTROSCOPY WITH NOVASURE ABLATION N/A 09/19/2015   Procedure: DILATATION & CURETTAGE/HYSTEROSCOPY WITH NOVASURE ABLATION;  Surgeon: Herold Harms, MD;  Location: [facility];  Service: Gynecology;  Laterality: N/A;   INSERTION OF MESH  06/08/2021   Procedure: INSERTION OF MESH;  Surgeon: Leafy Ro, MD;  Location: [facility];  Service: General;;   JOINT REPLACEMENT Left    30 years ago. knee   KNEE ARTHROSCOPY Left    VENTRAL HERNIA REPAIR N/A 06/08/2021   Procedure: HERNIA REPAIR VENTRAL ADULT, incisional;  Surgeon: Leafy Ro, MD;  Location: [facility];  Service: General;  Laterality: N/A;    Family History  Problem Relation Age of Onset   Cancer Father        throat   Hypertension Mother    Diabetes Paternal Grandmother    Breast cancer Neg Hx     Allergies  Allergen Reactions   Lisinopril Swelling    Lips and face   Losartan  Other (See Comments)    Mouth swollen, rash and itching   Wellbutrin [Bupropion] Hives and Swelling    Swelling of mouth   Amlodipine Swelling   Percocet [Oxycodone-Acetaminophen] Anxiety    Current Outpatient Medications on File Prior to Visit  Medication Sig Dispense Refill   carvedilol (COREG) 12.5 MG tablet TAKE 1 TABLET(12.5 MG) BY MOUTH TWICE DAILY WITH A MEAL FOR HEART RATE 180 tablet 0   cyclobenzaprine (FLEXERIL) 5 MG tablet Take 1-2 tablets (5-10 mg total) by mouth 3 (three) times daily as needed for muscle spasms. 30 tablet 0   EPINEPHrine 0.3 mg/0.3 mL IJ SOAJ injection Inject 0.3 mg into the muscle as needed for anaphylaxis.     glipiZIDE (GLUCOTROL XL) 10 MG  24 hr tablet Take 1 tablet (10 mg total) by mouth daily with breakfast. For diabetes. Office visit required for further refills. 30 tablet 0   hydrochlorothiazide (HYDRODIURIL) 25 MG tablet TAKE 1 TABLET BY MOUTH EVERY DAY FOR BLOOD PRESSURE 90 tablet 0   ibuprofen (ADVIL) 800 MG tablet Take 1 tablet (800 mg total) by mouth every 8 (eight) hours as needed for moderate pain. 30 tablet 0   Multiple Vitamin (MULTIVITAMIN) tablet Take 1 tablet by mouth daily.     potassium chloride SA (KLOR-CON M20) 20 MEQ tablet TAKE 1 TABLET (20 MEQ TOTAL) BY MOUTH DAILY. FOR LOW POTASSIUM. 90 tablet 0   sertraline (ZOLOFT) 100 MG tablet TAKE 1 TABLET(100 MG) BY MOUTH DAILY FOR ANXIETY. TAKE WITH 25 MG DOSE. 90 tablet 0   sertraline (ZOLOFT) 25 MG tablet TAKE 1 TABLET BY MOUTH DAILY. FOR ANXIETY. TAKE WITH 100 MG TABLET. 90 tablet 2   traZODone (DESYREL) 150 MG tablet TAKE 1 TABLET BY MOUTH EVERY DAY AT BEDTIME FOR SLEEP 90 tablet 0   estradiol-norethindrone (MIMVEY) 1-0.5 MG tablet Take 1 tablet by mouth daily. 90 tablet 3   No current facility-administered medications on file prior to visit.    BP 118/78   Pulse 88   Temp 98.2 F (36.8 C) (Temporal)   Ht 5\' 9"  (1.753 m)   Wt 184 lb 6.4 oz (83.6 kg)   SpO2 97%   BMI 27.23 kg/m   Objective:   Physical Exam HENT:     Right Ear: Tympanic membrane and ear canal normal.     Left Ear: Tympanic membrane and ear canal normal.     Nose: Nose normal.  Eyes:     Conjunctiva/sclera: Conjunctivae normal.     Pupils: Pupils are equal, round, and reactive to light.  Neck:     Thyroid: No thyromegaly.  Cardiovascular:     Rate and Rhythm: Normal rate and regular rhythm.     Heart sounds: No murmur heard. Pulmonary:     Effort: Pulmonary effort is normal.     Breath sounds: Normal breath sounds. No rales.  Abdominal:     General: Bowel sounds are normal.     Palpations: Abdomen is soft.     Tenderness: There is no abdominal tenderness.  Musculoskeletal:        General: Normal range of motion.     Cervical back: Neck supple.  Lymphadenopathy:     Cervical: No cervical adenopathy.  Skin:    General: Skin is warm and dry.     Findings: No rash.  Neurological:     Mental Status: She is alert and oriented to person, place, and time.     Cranial Nerves: No cranial nerve deficit.     Deep Tendon Reflexes: Reflexes are normal and symmetric.  Psychiatric:        Mood and Affect: Mood normal.           Assessment & Plan:  Primary hypertension Assessment & Plan: Controlled.   Continue carvedilol 12.6 mg BID, hydrochlorothiazide 25 mg daily. CMP pending.  Continue to monitor.   Orders: -     Lipid panel  Type 2 diabetes mellitus with hyperglycemia, without long-term current use of insulin (HCC) Assessment & Plan: Repeat A1c pending.  Increase Ozempic to 2 mg weekly for cravings and weight loss. Continue glipizide 10 mg XL for now. Consider dose reduction of glipizide  Await results.  Follow-up in 3 to 6 months based on A1c result  Orders: -     Semaglutide (2 MG/DOSE); Inject 2 mg as directed once a week. for diabetes.  Dispense: 9 mL; Refill: 0 -     Hemoglobin A1c -     Comprehensive metabolic panel  Adjustment insomnia Assessment &  Plan: Controlled.  Continue trazodone 150 mg at bedtime.   Hypokalemia Assessment & Plan: Continue potassium chloride 20 mEq daily. Repeat renal function pending.   GAD (generalized anxiety disorder) Assessment & Plan: Deteriorated.  Discussed options. Continue Zoloft 125 mg daily and trazodone 150 mg at bedtime.  Add buspirone 5 mg once to twice daily. She will update.  Orders: -     busPIRone HCl; Take 1 tablet (5 mg total) by mouth 2 (two) times daily. For anxiety  Dispense: 60 tablet; Refill: 0  Chronic left-sided low back pain without sciatica Assessment & Plan: Controlled.  Continue cyclobenzaprine 5 mg as needed.   Screening for colon cancer -     Cologuard  Encounter for annual general medical examination with abnormal findings in adult Assessment & Plan: Immunizations UTD. Pap smear UTD. Mammogram due, she will schedule at her GYN office Colonoscopy overdue.  She declines colonoscopy but opts for Cologuard.  Orders for Cologuard placed  Discussed the importance of a healthy diet and regular exercise in order for weight loss, and to reduce the risk of further co-morbidity.  Exam stable. Labs pending.  Follow up in 1 year for repeat physical.          Doreene Nest, NP

## 2023-01-21 NOTE — Assessment & Plan Note (Signed)
Immunizations UTD. Pap smear UTD. Mammogram due, she will schedule at her GYN office Colonoscopy overdue.  She declines colonoscopy but opts for Cologuard.  Orders for Cologuard placed  Discussed the importance of a healthy diet and regular exercise in order for weight loss, and to reduce the risk of further co-morbidity.  Exam stable. Labs pending.  Follow up in 1 year for repeat physical.

## 2023-01-21 NOTE — Assessment & Plan Note (Signed)
Continue potassium chloride 20 mEq daily. Repeat renal function pending.

## 2023-01-21 NOTE — Assessment & Plan Note (Signed)
Repeat A1c pending.  Increase Ozempic to 2 mg weekly for cravings and weight loss. Continue glipizide 10 mg XL for now. Consider dose reduction of glipizide  Await results.  Follow-up in 3 to 6 months based on A1c result

## 2023-01-21 NOTE — Assessment & Plan Note (Signed)
Controlled.  Continue trazodone 150 mg at bedtime.

## 2023-01-21 NOTE — Assessment & Plan Note (Signed)
Controlled.  Continue cyclobenzaprine 5 mg as needed.

## 2023-01-21 NOTE — Patient Instructions (Signed)
We increased your dose of Ozempic to 2 mg weekly for diabetes and weight loss.  Start buspirone (BuSpar) for anxiety.  Take 1 tablet by mouth once daily for about 1 week, then increase to twice daily thereafter if needed.  Stop by the lab prior to leaving today. I will notify you of your results once received.   Complete the Cologuard kit as discussed.  Schedule your mammogram.  Please schedule a follow up visit for 6 months for a diabetes check.  It was a pleasure to see you today!

## 2023-01-21 NOTE — Assessment & Plan Note (Addendum)
Controlled.   Continue carvedilol 12.6 mg BID, hydrochlorothiazide 25 mg daily. CMP pending.  Continue to monitor.

## 2023-01-30 ENCOUNTER — Ambulatory Visit: Payer: 59 | Admitting: Primary Care

## 2023-02-12 ENCOUNTER — Other Ambulatory Visit: Payer: Self-pay | Admitting: Primary Care

## 2023-02-12 DIAGNOSIS — F411 Generalized anxiety disorder: Secondary | ICD-10-CM

## 2023-02-25 ENCOUNTER — Other Ambulatory Visit: Payer: Self-pay | Admitting: Primary Care

## 2023-02-25 DIAGNOSIS — F411 Generalized anxiety disorder: Secondary | ICD-10-CM

## 2023-03-12 ENCOUNTER — Other Ambulatory Visit: Payer: Self-pay | Admitting: Primary Care

## 2023-03-12 DIAGNOSIS — E876 Hypokalemia: Secondary | ICD-10-CM

## 2023-03-12 DIAGNOSIS — I10 Essential (primary) hypertension: Secondary | ICD-10-CM

## 2023-03-15 ENCOUNTER — Other Ambulatory Visit: Payer: Self-pay | Admitting: Primary Care

## 2023-03-15 DIAGNOSIS — G4700 Insomnia, unspecified: Secondary | ICD-10-CM

## 2023-04-10 ENCOUNTER — Other Ambulatory Visit: Payer: Self-pay | Admitting: Primary Care

## 2023-04-10 DIAGNOSIS — E1165 Type 2 diabetes mellitus with hyperglycemia: Secondary | ICD-10-CM

## 2023-04-12 ENCOUNTER — Other Ambulatory Visit: Payer: Self-pay | Admitting: Primary Care

## 2023-04-12 DIAGNOSIS — Z1211 Encounter for screening for malignant neoplasm of colon: Secondary | ICD-10-CM

## 2023-04-12 DIAGNOSIS — Z1212 Encounter for screening for malignant neoplasm of rectum: Secondary | ICD-10-CM

## 2023-04-14 ENCOUNTER — Other Ambulatory Visit: Payer: Self-pay | Admitting: Primary Care

## 2023-04-14 DIAGNOSIS — I10 Essential (primary) hypertension: Secondary | ICD-10-CM

## 2023-05-23 ENCOUNTER — Other Ambulatory Visit: Payer: Self-pay | Admitting: Primary Care

## 2023-05-23 DIAGNOSIS — F411 Generalized anxiety disorder: Secondary | ICD-10-CM

## 2023-06-29 ENCOUNTER — Other Ambulatory Visit: Payer: Self-pay | Admitting: Primary Care

## 2023-06-29 DIAGNOSIS — E1165 Type 2 diabetes mellitus with hyperglycemia: Secondary | ICD-10-CM

## 2023-07-24 ENCOUNTER — Encounter: Payer: Self-pay | Admitting: Primary Care

## 2023-07-24 ENCOUNTER — Ambulatory Visit (INDEPENDENT_AMBULATORY_CARE_PROVIDER_SITE_OTHER): Payer: No Typology Code available for payment source | Admitting: Primary Care

## 2023-07-24 VITALS — BP 158/94 | HR 88 | Temp 97.7°F | Ht 69.0 in | Wt 185.0 lb

## 2023-07-24 DIAGNOSIS — Z7984 Long term (current) use of oral hypoglycemic drugs: Secondary | ICD-10-CM | POA: Diagnosis not present

## 2023-07-24 DIAGNOSIS — B001 Herpesviral vesicular dermatitis: Secondary | ICD-10-CM | POA: Diagnosis not present

## 2023-07-24 DIAGNOSIS — Z7985 Long-term (current) use of injectable non-insulin antidiabetic drugs: Secondary | ICD-10-CM | POA: Diagnosis not present

## 2023-07-24 DIAGNOSIS — E1165 Type 2 diabetes mellitus with hyperglycemia: Secondary | ICD-10-CM

## 2023-07-24 LAB — POCT GLYCOSYLATED HEMOGLOBIN (HGB A1C): Hemoglobin A1C: 5.9 % — AB (ref 4.0–5.6)

## 2023-07-24 LAB — MICROALBUMIN / CREATININE URINE RATIO
Creatinine,U: 137.3 mg/dL
Microalb Creat Ratio: 0.6 mg/g (ref 0.0–30.0)
Microalb, Ur: 0.8 mg/dL (ref 0.0–1.9)

## 2023-07-24 MED ORDER — VALACYCLOVIR HCL 1 G PO TABS
2000.0000 mg | ORAL_TABLET | Freq: Two times a day (BID) | ORAL | 0 refills | Status: DC
Start: 2023-07-24 — End: 2023-08-16

## 2023-07-24 NOTE — Patient Instructions (Signed)
 Stop by the lab prior to leaving today. I will notify you of your results once received.   Start valacyclovir  for the cold sore.  Take 2 tablets by mouth twice daily for 1 day.  It was a pleasure to see you today!

## 2023-07-24 NOTE — Assessment & Plan Note (Signed)
 Improved and controlled with A1c of 5.9 today!  Continue Ozempic  2 mg weekly, glipizide  XL 10 mg daily. Foot exam today.  Urine microalbumin due and pending.  Follow-up in 6 months.

## 2023-07-24 NOTE — Progress Notes (Signed)
 Subjective:    Patient ID: Michelle Marshall, female    DOB: Sep 05, 1965, 58 y.o.   MRN: [id number]  HPI  Michelle Marshall is a very pleasant 58 y.o. female with a history of type 2 diabetes, hypertension, iron deficiency anemia, insomnia, GAD, tobacco use who presents today for follow-up of diabetes. She would also like to discuss fever blisters.   1) Type 2 Diabetes: Current medications include: Glipizide  XL 10 mg daily, Ozempic  2 mg weekly  She is checking her blood glucose once weekly and is getting readings of low to mid 100s  Last A1C: 6.6 in July 2024, 5.9 today Last Eye Exam: Up-to-date Last Foot Exam: Due Pneumonia Vaccination: Completed in 2018 Urine Microalbumin: Due Statin: None.  Dietary changes since last visit: Reduced portion sizes. Has increased pasta intake.    Exercise: None  BP Readings from Last 3 Encounters:  07/24/23 (!) 158/94  01/21/23 118/78  10/03/22 136/80   Wt Readings from Last 3 Encounters:  07/24/23 185 lb (83.9 kg)  01/21/23 184 lb 6.4 oz (83.6 kg)  10/03/22 181 lb (82.1 kg)   2) Fever Blisters: Long history of intermittent "fever blisters" which occur to the upper lip, one side at a time. Her current one began yesterday to the left upper lip, is tingling and sore. She's applied OTC treatment without much improvement.      Review of Systems  Eyes:  Negative for visual disturbance.  Respiratory:  Negative for shortness of breath.   Cardiovascular:  Negative for chest pain.  Neurological:  Positive for headaches. Negative for dizziness.         Past Medical History:  Diagnosis Date   Abdominal mass 04/06/2020   Abnormal uterine bleeding 08/21/2015   May 2018-spotting associated mild cramping   Acute pain of left knee 10/15/2018   Acute pain of right foot 08/31/2020   Amenorrhea 01/25/2016   Anemia    Arthritis    Complication of anesthesia 2002   DURING SCAR TISSUE EXCISION FROM FALLOPIAN TUBES(2002), PT WAS GIVEN GENERAL  ANESTHESIA AND PT BEGAN HAVING A REACTION TO THE ANESTHESIA AND LIPS AND TONGUE STARTED SWELLING AND THE SURGERY HAD TO BE STOPPED DUE TO AIRWAY CLOSING UP-PT HAD GA PRIOR TO THIS FOR HER GALLBLADDER WITH NO PROBLEM PREVIOUSLY   Depression    Diabetes mellitus without complication (HCC)    Dysrhythmia    H/O TACHYCARDIA. stress and anxiety brings it on   Family history of adverse reaction to anesthesia    BROTHER-HARD TIME WAKING UP   Grief 04/23/2018   Recent loss of husband August 2019-brain aneurysm   Hypertension    Irregular periods    Postop check-status post hysteroscopy/D&C 10/09/2017   Status post endometrial ablation 01/25/2016   Vitamin D deficiency    per patient, no longer a problem    Social History   Socioeconomic History   Marital status: Widowed    Spouse name: Not on file   Number of children: 1   Years of education: Not on file   Highest education level: Not on file  Occupational History   Not on file  Tobacco Use   Smoking status: Some Days    Current packs/day: 0.00    Average packs/day: 0.2 packs/day for 20.0 years (3.0 ttl pk-yrs)    Types: Cigarettes    Start date: 09/05/1995    Last attempt to quit: 09/05/2015    Years since quitting: 7.8   Smokeless tobacco: Never  Vaping Use  Vaping status: Never Used  Substance and Sexual Activity   Alcohol use: Not Currently    Alcohol/week: 1.0 standard drink of alcohol    Types: 1 Glasses of wine per week    Comment: occasional glass of wine   Drug use: No   Sexual activity: Yes    Birth control/protection: None, Post-menopausal    Comment: ablation  Other Topics Concern   Not on file  Social History Narrative   Married.   1 child.   She works as a Chartered loss adjuster   Enjoys shopping, traveling.    Social Drivers of Corporate investment banker Strain: Not on file  Food Insecurity: Not on file  Transportation Needs: Not on file  Physical Activity: Inactive (05/15/2018)   Exercise Vital Sign    Days  of Exercise per Week: 0 days    Minutes of Exercise per Session: 0 min  Stress: Not on file  Social Connections: Not on file  Intimate Partner Violence: Not on file    Past Surgical History:  Procedure Laterality Date   CHOLECYSTECTOMY     DILATATION & CURETTAGE/HYSTEROSCOPY WITH MYOSURE N/A 09/30/2017   Procedure: DILATATION & CURETTAGE/HYSTEROSCOPY;  Surgeon: Colan Dash, MD;  Location: [facility];  Service: Gynecology;  Laterality: N/A;   DILITATION & CURRETTAGE/HYSTROSCOPY WITH NOVASURE ABLATION N/A 09/19/2015   Procedure: DILATATION & CURETTAGE/HYSTEROSCOPY WITH NOVASURE ABLATION;  Surgeon: Colan Dash, MD;  Location: [facility];  Service: Gynecology;  Laterality: N/A;   INSERTION OF MESH  06/08/2021   Procedure: INSERTION OF MESH;  Surgeon: Alben Alma, MD;  Location: [facility];  Service: General;;   JOINT REPLACEMENT Left    30 years ago. knee   KNEE ARTHROSCOPY Left    VENTRAL HERNIA REPAIR N/A 06/08/2021   Procedure: HERNIA REPAIR VENTRAL ADULT, incisional;  Surgeon: Alben Alma, MD;  Location: [facility];  Service: General;  Laterality: N/A;    Family History  Problem Relation Age of Onset   Cancer Father        throat   Hypertension Mother    Diabetes Paternal Grandmother    Breast cancer Neg Hx     Allergies  Allergen Reactions   Lisinopril Swelling    Lips and face   Losartan  Other (See Comments)    Mouth swollen, rash and itching   Wellbutrin [Bupropion] Hives and Swelling    Swelling of mouth   Amlodipine  Swelling   Percocet [Oxycodone -Acetaminophen ] Anxiety    Current Outpatient Medications on File Prior to Visit  Medication Sig Dispense Refill   busPIRone  (BUSPAR ) 5 MG tablet TAKE 1 TABLET BY MOUTH 2 TIMES DAILY. FOR ANXIETY 180 tablet 1   carvedilol  (COREG ) 12.5 MG tablet TAKE 1 TABLET(12.5 MG) BY MOUTH TWICE DAILY WITH A MEAL FOR HEART RATE 180 tablet 2   EPINEPHrine  0.3 mg/0.3 mL IJ SOAJ injection Inject 0.3 mg into the muscle as  needed for anaphylaxis.     glipiZIDE  (GLUCOTROL  XL) 10 MG 24 hr tablet Take 1 tablet (10 mg total) by mouth daily with breakfast. For diabetes. Office visit required for further refills. 30 tablet 0   hydrochlorothiazide  (HYDRODIURIL ) 25 MG tablet TAKE 1 TABLET BY MOUTH EVERY DAY FOR BLOOD PRESSURE 90 tablet 2   ibuprofen  (ADVIL ) 800 MG tablet Take 1 tablet (800 mg total) by mouth every 8 (eight) hours as needed for moderate pain. 30 tablet 0   Multiple Vitamin (MULTIVITAMIN) tablet Take 1 tablet by mouth daily.  potassium chloride  SA (KLOR-CON  M20) 20 MEQ tablet TAKE 1 TABLET (20 MEQ TOTAL) BY MOUTH DAILY. FOR LOW POTASSIUM. 90 tablet 2   Semaglutide , 2 MG/DOSE, (OZEMPIC , 2 MG/DOSE,) 8 MG/3ML SOPN INJECT 2 MG AS DIRECTED ONCE A WEEK. FOR DIABETES. 9 mL 0   sertraline  (ZOLOFT ) 100 MG tablet TAKE 1 TABLET(100 MG) BY MOUTH DAILY FOR ANXIETY. TAKE WITH 25 MG DOSE. 90 tablet 2   sertraline  (ZOLOFT ) 25 MG tablet TAKE 1 TABLET BY MOUTH DAILY. FOR ANXIETY. TAKE WITH 100 MG TABLET. 90 tablet 2   traZODone  (DESYREL ) 150 MG tablet TAKE 1 TABLET BY MOUTH EVERY DAY AT BEDTIME FOR SLEEP 90 tablet 2   cyclobenzaprine  (FLEXERIL ) 5 MG tablet Take 1-2 tablets (5-10 mg total) by mouth 3 (three) times daily as needed for muscle spasms. (Patient not taking: Reported on 07/24/2023) 30 tablet 0   estradiol -norethindrone  (MIMVEY) 1-0.5 MG tablet Take 1 tablet by mouth daily. 90 tablet 3   No current facility-administered medications on file prior to visit.    BP (!) 158/94   Pulse 88   Temp 97.7 F (36.5 C) (Temporal)   Ht 5\' 9"  (1.753 m)   Wt 185 lb (83.9 kg)   SpO2 95%   BMI 27.32 kg/m  Objective:   Physical Exam Cardiovascular:     Rate and Rhythm: Normal rate and regular rhythm.  Pulmonary:     Effort: Pulmonary effort is normal.     Breath sounds: Normal breath sounds.  Musculoskeletal:     Cervical back: Neck supple.  Skin:    General: Skin is warm and dry.     Comments: 2 small circular  flesh-colored lesions to left upper lip  Neurological:     Mental Status: She is alert and oriented to person, place, and time.  Psychiatric:        Mood and Affect: Mood normal.           Assessment & Plan:  Type 2 diabetes mellitus with hyperglycemia, without long-term current use of insulin (HCC) Assessment & Plan: Improved and controlled with A1c of 5.9 today!  Continue Ozempic  2 mg weekly, glipizide  XL 10 mg daily. Foot exam today.  Urine microalbumin due and pending.  Follow-up in 6 months.  Orders: -     POCT glycosylated hemoglobin (Hb A1C) -     Microalbumin / creatinine urine ratio  Cold sore Assessment & Plan: Exam today representative. I do not see where she has previously been tested.  Labs pending today.   Start Valtrex  2 g twice daily x 1 day.  Orders: -     valACYclovir  HCl; Take 2 tablets (2,000 mg total) by mouth 2 (two) times daily. For one day  Dispense: 4 tablet; Refill: 0 -     HSV(herpes simplex vrs) 1+2 ab-IgG        Gabriel John, NP

## 2023-07-24 NOTE — Assessment & Plan Note (Signed)
 Exam today representative. I do not see where she has previously been tested.  Labs pending today.   Start Valtrex  2 g twice daily x 1 day.

## 2023-07-25 LAB — HSV(HERPES SIMPLEX VRS) I + II AB-IGG
HSV 1 IGG,TYPE SPECIFIC AB: 51.7 {index} — ABNORMAL HIGH
HSV 2 IGG,TYPE SPECIFIC AB: 10.4 {index} — ABNORMAL HIGH

## 2023-08-08 ENCOUNTER — Encounter: Payer: Self-pay | Admitting: Primary Care

## 2023-08-16 DIAGNOSIS — I10 Essential (primary) hypertension: Secondary | ICD-10-CM

## 2023-08-16 DIAGNOSIS — B001 Herpesviral vesicular dermatitis: Secondary | ICD-10-CM

## 2023-08-16 MED ORDER — VALACYCLOVIR HCL 1 G PO TABS
2000.0000 mg | ORAL_TABLET | Freq: Two times a day (BID) | ORAL | 0 refills | Status: AC
Start: 2023-08-16 — End: ?

## 2023-08-18 ENCOUNTER — Observation Stay
Admission: EM | Admit: 2023-08-18 | Discharge: 2023-08-19 | Disposition: A | Discharge status: Home / Self Care | Payer: No Typology Code available for payment source | Attending: Internal Medicine | Admitting: Internal Medicine

## 2023-08-18 ENCOUNTER — Emergency Department: Payer: No Typology Code available for payment source

## 2023-08-18 ENCOUNTER — Other Ambulatory Visit: Payer: Self-pay

## 2023-08-18 DIAGNOSIS — Z7984 Long term (current) use of oral hypoglycemic drugs: Secondary | ICD-10-CM | POA: Insufficient documentation

## 2023-08-18 DIAGNOSIS — Z7985 Long-term (current) use of injectable non-insulin antidiabetic drugs: Secondary | ICD-10-CM | POA: Diagnosis not present

## 2023-08-18 DIAGNOSIS — Z79899 Other long term (current) drug therapy: Secondary | ICD-10-CM | POA: Diagnosis not present

## 2023-08-18 DIAGNOSIS — R000 Tachycardia, unspecified: Secondary | ICD-10-CM | POA: Diagnosis present

## 2023-08-18 DIAGNOSIS — I471 Supraventricular tachycardia, unspecified: Principal | ICD-10-CM | POA: Diagnosis present

## 2023-08-18 DIAGNOSIS — R7989 Other specified abnormal findings of blood chemistry: Secondary | ICD-10-CM | POA: Diagnosis not present

## 2023-08-18 DIAGNOSIS — I10 Essential (primary) hypertension: Secondary | ICD-10-CM | POA: Diagnosis not present

## 2023-08-18 DIAGNOSIS — E119 Type 2 diabetes mellitus without complications: Secondary | ICD-10-CM | POA: Insufficient documentation

## 2023-08-18 DIAGNOSIS — F17210 Nicotine dependence, cigarettes, uncomplicated: Secondary | ICD-10-CM | POA: Insufficient documentation

## 2023-08-18 DIAGNOSIS — E876 Hypokalemia: Secondary | ICD-10-CM | POA: Diagnosis not present

## 2023-08-18 DIAGNOSIS — Z96652 Presence of left artificial knee joint: Secondary | ICD-10-CM | POA: Diagnosis not present

## 2023-08-18 DIAGNOSIS — F419 Anxiety disorder, unspecified: Secondary | ICD-10-CM | POA: Insufficient documentation

## 2023-08-18 LAB — CBC
HCT: 40.9 % (ref 36.0–46.0)
Hemoglobin: 14 g/dL (ref 12.0–15.0)
MCH: 32.9 pg (ref 26.0–34.0)
MCHC: 34.2 g/dL (ref 30.0–36.0)
MCV: 96 fL (ref 80.0–100.0)
Platelets: 326 10*3/uL (ref 150–400)
RBC: 4.26 MIL/uL (ref 3.87–5.11)
RDW: 12.8 % (ref 11.5–15.5)
WBC: 11.5 10*3/uL — ABNORMAL HIGH (ref 4.0–10.5)
nRBC: 0 % (ref 0.0–0.2)

## 2023-08-18 MED ORDER — AMLODIPINE BESYLATE 5 MG PO TABS
5.0000 mg | ORAL_TABLET | Freq: Every day | ORAL | 0 refills | Status: DC
Start: 2023-08-18 — End: 2023-09-12

## 2023-08-18 NOTE — ED Triage Notes (Signed)
 Pt to ED via [facility] from home c/o tachycardia. Pt reports about 1hr ago pt started having CP, SHOB. EMS got there and pt was in SVT HR 180s. Vagal maneuvers were attempted but did not work. 6mg  adenosine given. Pt converted to sinus tach. No cardiac hx. Pt reports feeling fine now, no symptoms.   98%RA 112 HR 18RR 150/90  18LAC

## 2023-08-19 ENCOUNTER — Observation Stay (HOSPITAL_BASED_OUTPATIENT_CLINIC_OR_DEPARTMENT_OTHER)
Admit: 2023-08-19 | Discharge: 2023-08-19 | Disposition: A | Discharge status: Home / Self Care | Payer: No Typology Code available for payment source | Attending: Family Medicine | Admitting: Family Medicine

## 2023-08-19 ENCOUNTER — Observation Stay: Payer: No Typology Code available for payment source

## 2023-08-19 DIAGNOSIS — R7989 Other specified abnormal findings of blood chemistry: Secondary | ICD-10-CM

## 2023-08-19 DIAGNOSIS — I471 Supraventricular tachycardia, unspecified: Principal | ICD-10-CM | POA: Diagnosis present

## 2023-08-19 DIAGNOSIS — E876 Hypokalemia: Secondary | ICD-10-CM | POA: Diagnosis not present

## 2023-08-19 DIAGNOSIS — R9431 Abnormal electrocardiogram [ECG] [EKG]: Secondary | ICD-10-CM | POA: Diagnosis not present

## 2023-08-19 DIAGNOSIS — I10 Essential (primary) hypertension: Secondary | ICD-10-CM | POA: Insufficient documentation

## 2023-08-19 DIAGNOSIS — F419 Anxiety disorder, unspecified: Secondary | ICD-10-CM | POA: Diagnosis not present

## 2023-08-19 DIAGNOSIS — F32A Depression, unspecified: Secondary | ICD-10-CM

## 2023-08-19 LAB — ECHOCARDIOGRAM COMPLETE
AR max vel: 2.7 cm2
AV Area VTI: 2.5 cm2
AV Area mean vel: 2.53 cm2
AV Mean grad: 3 mm[Hg]
AV Peak grad: 5.4 mm[Hg]
Ao pk vel: 1.16 m/s
Area-P 1/2: 3.31 cm2
Height: 69 in
S' Lateral: 2.2 cm
Weight: 2880 [oz_av]

## 2023-08-19 LAB — BASIC METABOLIC PANEL
Anion gap: 19 — ABNORMAL HIGH (ref 5–15)
Anion gap: 9 (ref 5–15)
BUN: 16 mg/dL (ref 6–20)
BUN: 16 mg/dL (ref 6–20)
CO2: 19 mmol/L — ABNORMAL LOW (ref 22–32)
CO2: 25 mmol/L (ref 22–32)
Calcium: 8.8 mg/dL — ABNORMAL LOW (ref 8.9–10.3)
Calcium: 8.9 mg/dL (ref 8.9–10.3)
Chloride: 102 mmol/L (ref 98–111)
Chloride: 104 mmol/L (ref 98–111)
Creatinine, Ser: 0.63 mg/dL (ref 0.44–1.00)
Creatinine, Ser: 0.8 mg/dL (ref 0.44–1.00)
GFR, Estimated: >60 mL/min (ref >60)
GFR, Estimated: >60 mL/min (ref >60)
Glucose, Bld: 109 mg/dL — ABNORMAL HIGH (ref 70–99)
Glucose, Bld: 142 mg/dL — ABNORMAL HIGH (ref 70–99)
Potassium: 3.1 mmol/L — ABNORMAL LOW (ref 3.5–5.1)
Potassium: 3.6 mmol/L (ref 3.5–5.1)
Sodium: 138 mmol/L (ref 135–145)
Sodium: 140 mmol/L (ref 135–145)

## 2023-08-19 LAB — CBC
HCT: 36.1 % (ref 36.0–46.0)
Hemoglobin: 12.7 g/dL (ref 12.0–15.0)
MCH: 32.4 pg (ref 26.0–34.0)
MCHC: 35.2 g/dL (ref 30.0–36.0)
MCV: 92.1 fL (ref 80.0–100.0)
Platelets: 313 10*3/uL (ref 150–400)
RBC: 3.92 MIL/uL (ref 3.87–5.11)
RDW: 12.8 % (ref 11.5–15.5)
WBC: 10.1 10*3/uL (ref 4.0–10.5)
nRBC: 0 % (ref 0.0–0.2)

## 2023-08-19 LAB — TSH: TSH: 2.233 u[IU]/mL (ref 0.350–4.500)

## 2023-08-19 LAB — TROPONIN I (HIGH SENSITIVITY)
Troponin I (High Sensitivity): 143 ng/L (ref <18)
Troponin I (High Sensitivity): 184 ng/L (ref <18)
Troponin I (High Sensitivity): 26 ng/L — ABNORMAL HIGH (ref <18)

## 2023-08-19 LAB — HIV ANTIBODY (ROUTINE TESTING W REFLEX): HIV Screen 4th Generation wRfx: NONREACTIVE

## 2023-08-19 LAB — PROTIME-INR
INR: 1 (ref 0.8–1.2)
Prothrombin Time: 13.5 s (ref 11.4–15.2)

## 2023-08-19 LAB — MAGNESIUM: Magnesium: 2.2 mg/dL (ref 1.7–2.4)

## 2023-08-19 LAB — HEPARIN LEVEL (UNFRACTIONATED): Heparin Unfractionated: 0.35 [IU]/mL (ref 0.30–0.70)

## 2023-08-19 LAB — T4, FREE: Free T4: 0.9 ng/dL (ref 0.61–1.12)

## 2023-08-19 LAB — APTT: aPTT: 27 s (ref 24–36)

## 2023-08-19 MED ORDER — METOPROLOL TARTRATE 5 MG/5ML IV SOLN
INTRAVENOUS | Status: AC
  Filled 2023-08-19: qty 10

## 2023-08-19 MED ORDER — TRAZODONE HCL 50 MG PO TABS: 25.0000 mg | ORAL_TABLET | Freq: Every evening | ORAL | Status: DC | PRN

## 2023-08-19 MED ORDER — SERTRALINE HCL 50 MG PO TABS
125.0000 mg | ORAL_TABLET | Freq: Every day | ORAL | Status: DC
  Administered 2023-08-19: 125 mg via ORAL
  Filled 2023-08-19: qty 3

## 2023-08-19 MED ORDER — CARVEDILOL 25 MG PO TABS: 25.0000 mg | ORAL_TABLET | Freq: Two times a day (BID) | ORAL | 1 refills | Status: AC

## 2023-08-19 MED ORDER — METOPROLOL TARTRATE 50 MG PO TABS
100.0000 mg | ORAL_TABLET | Freq: Once | ORAL | Status: AC
  Administered 2023-08-19: 100 mg via ORAL
  Filled 2023-08-19: qty 2

## 2023-08-19 MED ORDER — HYDROCHLOROTHIAZIDE 25 MG PO TABS: 25.0000 mg | ORAL_TABLET | Freq: Every day | ORAL | 2 refills | Status: AC

## 2023-08-19 MED ORDER — MAGNESIUM HYDROXIDE 400 MG/5ML PO SUSP: 30.0000 mL | Freq: Every day | ORAL | Status: DC | PRN

## 2023-08-19 MED ORDER — ALUM & MAG HYDROXIDE-SIMETH 200-200-20 MG/5ML PO SUSP
30.0000 mL | Freq: Once | ORAL | Status: AC
  Administered 2023-08-19: 30 mL via ORAL
  Filled 2023-08-19: qty 30

## 2023-08-19 MED ORDER — HYDROCHLOROTHIAZIDE 25 MG PO TABS
25.0000 mg | ORAL_TABLET | Freq: Every day | ORAL | Status: DC
  Administered 2023-08-19: 25 mg via ORAL
  Filled 2023-08-19: qty 1

## 2023-08-19 MED ORDER — SODIUM CHLORIDE 0.9 % IV SOLN: INTRAVENOUS | Status: DC

## 2023-08-19 MED ORDER — ADULT MULTIVITAMIN W/MINERALS CH
1.0000 | ORAL_TABLET | Freq: Every day | ORAL | Status: DC
  Administered 2023-08-19: 1 via ORAL
  Filled 2023-08-19: qty 1

## 2023-08-19 MED ORDER — IOHEXOL 350 MG/ML SOLN
100.0000 mL | Freq: Once | INTRAVENOUS | Status: AC | PRN
  Administered 2023-08-19: 80 mL via INTRAVENOUS

## 2023-08-19 MED ORDER — DILTIAZEM HCL 25 MG/5ML IV SOLN: 10.0000 mg | INTRAVENOUS | Status: DC | PRN

## 2023-08-19 MED ORDER — ASPIRIN 81 MG PO CHEW
324.0000 mg | CHEWABLE_TABLET | Freq: Once | ORAL | Status: AC
  Administered 2023-08-19: 324 mg via ORAL
  Filled 2023-08-19: qty 4

## 2023-08-19 MED ORDER — POTASSIUM CHLORIDE CRYS ER 20 MEQ PO TBCR
40.0000 meq | EXTENDED_RELEASE_TABLET | Freq: Once | ORAL | Status: AC
  Administered 2023-08-19: 40 meq via ORAL
  Filled 2023-08-19: qty 2

## 2023-08-19 MED ORDER — LORAZEPAM 2 MG/ML IJ SOLN
1.0000 mg | Freq: Once | INTRAMUSCULAR | Status: AC
  Administered 2023-08-19: 1 mg via INTRAVENOUS
  Filled 2023-08-19: qty 1

## 2023-08-19 MED ORDER — HEPARIN BOLUS VIA INFUSION
4000.0000 [IU] | Freq: Once | INTRAVENOUS | Status: AC
  Administered 2023-08-19: 4000 [IU] via INTRAVENOUS
  Filled 2023-08-19: qty 4000

## 2023-08-19 MED ORDER — ONDANSETRON HCL 4 MG/2ML IJ SOLN: 4.0000 mg | Freq: Four times a day (QID) | INTRAMUSCULAR | Status: DC | PRN

## 2023-08-19 MED ORDER — ACETAMINOPHEN 325 MG PO TABS: 650.0000 mg | ORAL_TABLET | Freq: Four times a day (QID) | ORAL | Status: DC | PRN

## 2023-08-19 MED ORDER — BUSPIRONE HCL 5 MG PO TABS
5.0000 mg | ORAL_TABLET | Freq: Two times a day (BID) | ORAL | Status: DC
  Administered 2023-08-19: 5 mg via ORAL
  Filled 2023-08-19: qty 1

## 2023-08-19 MED ORDER — LIDOCAINE VISCOUS HCL 2 % MT SOLN
15.0000 mL | Freq: Once | OROMUCOSAL | Status: AC
  Administered 2023-08-19: 15 mL via ORAL
  Filled 2023-08-19: qty 15

## 2023-08-19 MED ORDER — TRAZODONE HCL 50 MG PO TABS: 150.0000 mg | ORAL_TABLET | Freq: Every day | ORAL | Status: DC

## 2023-08-19 MED ORDER — HYDRALAZINE HCL 50 MG PO TABS
25.0000 mg | ORAL_TABLET | Freq: Three times a day (TID) | ORAL | Status: DC | PRN
  Administered 2023-08-19: 25 mg via ORAL
  Filled 2023-08-19: qty 1

## 2023-08-19 MED ORDER — ACETAMINOPHEN 325 MG RE SUPP: 650.0000 mg | Freq: Four times a day (QID) | RECTAL | Status: DC | PRN

## 2023-08-19 MED ORDER — NITROGLYCERIN 0.4 MG SL SUBL
0.8000 mg | SUBLINGUAL_TABLET | Freq: Once | SUBLINGUAL | Status: AC
  Administered 2023-08-19: 0.8 mg via SUBLINGUAL

## 2023-08-19 MED ORDER — METOPROLOL TARTRATE 5 MG/5ML IV SOLN
10.0000 mg | INTRAVENOUS | Status: AC | PRN
  Administered 2023-08-19 (×2): 10 mg via INTRAVENOUS

## 2023-08-19 MED ORDER — POTASSIUM CHLORIDE CRYS ER 20 MEQ PO TBCR
20.0000 meq | EXTENDED_RELEASE_TABLET | Freq: Every day | ORAL | Status: DC
  Administered 2023-08-19: 20 meq via ORAL
  Filled 2023-08-19: qty 1

## 2023-08-19 MED ORDER — ONDANSETRON HCL 4 MG PO TABS: 4.0000 mg | ORAL_TABLET | Freq: Four times a day (QID) | ORAL | Status: DC | PRN

## 2023-08-19 MED ORDER — AMLODIPINE BESYLATE 5 MG PO TABS
5.0000 mg | ORAL_TABLET | Freq: Every day | ORAL | Status: DC
Start: 2023-08-19 — End: 2023-08-19
  Administered 2023-08-19: 5 mg via ORAL
  Filled 2023-08-19: qty 1

## 2023-08-19 MED ORDER — EPINEPHRINE 0.3 MG/0.3ML IJ SOAJ: 0.3000 mg | INTRAMUSCULAR | Status: DC | PRN

## 2023-08-19 MED ORDER — CARVEDILOL 6.25 MG PO TABS
12.5000 mg | ORAL_TABLET | Freq: Two times a day (BID) | ORAL | Status: DC
  Administered 2023-08-19: 12.5 mg via ORAL
  Filled 2023-08-19: qty 2

## 2023-08-19 MED ORDER — KETOROLAC TROMETHAMINE 30 MG/ML IJ SOLN
30.0000 mg | Freq: Once | INTRAMUSCULAR | Status: AC
  Administered 2023-08-19: 30 mg via INTRAVENOUS
  Filled 2023-08-19: qty 1

## 2023-08-19 MED ORDER — GLIPIZIDE ER 10 MG PO TB24
10.0000 mg | ORAL_TABLET | Freq: Every day | ORAL | Status: DC
Start: 2023-08-19 — End: 2023-08-19
  Administered 2023-08-19: 10 mg via ORAL
  Filled 2023-08-19: qty 1

## 2023-08-19 MED ORDER — VALACYCLOVIR HCL 500 MG PO TABS: 2000.0000 mg | ORAL_TABLET | Freq: Two times a day (BID) | ORAL | Status: DC

## 2023-08-19 MED ORDER — HEPARIN (PORCINE) 25000 UT/250ML-% IV SOLN
1050.0000 [IU]/h | INTRAVENOUS | Status: DC
  Administered 2023-08-19: 1050 [IU]/h via INTRAVENOUS
  Filled 2023-08-19: qty 250

## 2023-08-19 NOTE — Progress Notes (Signed)
 ANTICOAGULATION CONSULT NOTE  Pharmacy Consult for heparin  infusion Indication: ACS/STEMI  Allergies  Allergen Reactions   Lisinopril Swelling    Lips and face   Losartan  Other (See Comments)    Mouth swollen, rash and itching   Wellbutrin [Bupropion] Hives and Swelling    Swelling of mouth   Amlodipine  Swelling   Percocet [Oxycodone -Acetaminophen ] Anxiety    Patient Measurements: Height: 5\' 9"  (175.3 cm) Weight: 81.6 kg (180 lb) IBW/kg (Calculated) : 66.2 Heparin  Dosing Weight: 81.6 kg  Vital Signs: Temp: 97.9 F (36.6 C) (02/10 0551) Temp Source: Oral (02/10 0551) BP: 168/97 (02/10 0904) Pulse Rate: 84 (02/10 0904)  Labs: Recent Labs    08/18/23 2338 08/19/23 0113 08/19/23 0545 08/19/23 0720 08/19/23 0816  HGB 14.0  --  12.7  --   --   HCT 40.9  --  36.1  --   --   PLT 326  --  313  --   --   APTT  --   --   --  27  --   LABPROT  --   --   --  13.5  --   INR  --   --   --  1.0  --   CREATININE 0.80  --  0.63  --   --   TROPONINIHS 26* 143*  --   --  184*    Estimated Creatinine Clearance: 89.7 mL/min (by C-G formula based on SCr of 0.63 mg/dL).   Medical History: Past Medical History:  Diagnosis Date   Abdominal mass 04/06/2020   Abnormal uterine bleeding 08/21/2015   May 2018-spotting associated mild cramping   Acute pain of left knee 10/15/2018   Acute pain of right foot 08/31/2020   Amenorrhea 01/25/2016   Anemia    Arthritis    Complication of anesthesia 2002   DURING SCAR TISSUE EXCISION FROM FALLOPIAN TUBES(2002), PT WAS GIVEN GENERAL ANESTHESIA AND PT BEGAN HAVING A REACTION TO THE ANESTHESIA AND LIPS AND TONGUE STARTED SWELLING AND THE SURGERY HAD TO BE STOPPED DUE TO AIRWAY CLOSING UP-PT HAD GA PRIOR TO THIS FOR HER GALLBLADDER WITH NO PROBLEM PREVIOUSLY   Depression    Diabetes mellitus without complication (HCC)    Dysrhythmia    H/O TACHYCARDIA. stress and anxiety brings it on   Family history of adverse reaction to anesthesia     BROTHER-HARD TIME WAKING UP   Grief 04/23/2018   Recent loss of husband August 2019-brain aneurysm   Hypertension    Irregular periods    Postop check-status post hysteroscopy/D&C 10/09/2017   Status post endometrial ablation 01/25/2016   Vitamin D deficiency    per patient, no longer a problem    Assessment: Pt is a 58 yo female presenting to ED with acute onset of tachycardia with palpitations, found with elevated Troponin I level trending up. She is on no chronic anticoagulation prior to arrival based on review of dispense records  Baseline Labs: H&H/PLT wnl, aPTT 27s, INR 1.0  Goal of Therapy:  Heparin  level 0.3-0.7 units/ml Monitor platelets by anticoagulation protocol: Yes   Plan: heparin  level therapeutic x 1 ---continue heparin  infusion at 1050 units/hr ---Will recheck heparin  level in 6 hours to confirm ---CBC at least once daily while on heparin   Barney Boozer, PharmD, BCPS 08/19/2023 9:49 AM

## 2023-08-19 NOTE — ED Notes (Signed)
 Pt ambulated to restroom w steady gate and returned to bed without incident. Pt given a lunch bag by this RN. Monitors replaced, Pt has expressed no further needs but stating she is ready to be discharged. CB within reach. NAD

## 2023-08-19 NOTE — Hospital Course (Addendum)
 Taken from H&P.  Michelle Marshall is a 58 y.o. African-American female with medical history significant for type 2 diabetes mellitus, depression, essential hypertension and osteoarthritis, who presented to the emergency room with acute onset of tachycardia with palpitations.  She was in normal state of health doing her normal activities evening when she developed palpitations with associated chest discomfort, dyspnea and lightheadedness.  She called EMS they tried vagal maneuver without success.  She was found to be in SVT and was given 6 mg of IV adenosine with successful conversion.  History of recent influenza-like symptoms a couple of weeks ago which has been recovered.  On presentation mildly elevated blood pressure, heart rate 111, afebrile and no hypoxia.  Labs with hypokalemia at 3.1, CO2 19, blood glucose of 142 and anion gap of 19.  Troponin 26>> 143, leukocytosis at 11.5, TSH 2.23 and free T4 0.9. EKG with sinus tachycardia, T wave inversion inferiorly. Chest x-ray with no acute cardiopulmonary disease.  Cardiology was consulted, they are recommending coronary CTA, Pending echocardiogram.  2/10: Vital stable, potassium improved to 3.6, troponin slowly increasing, currently at 184, CTA coronary with calcium score of 1.31.  Minimum RCA stenosis, echocardiogram with normal EF, grade 1 diastolic dysfunction and no regional wall motion abnormalities.  Rest of the echo was also unremarkable. Cardiology increased the dose of carvedilol  and they will follow-up as outpatient as she might need evaluation by EP.  Patient will continue on her current medications, carvedilol  dose was increased from 12.5 mg twice daily to 25 mg twice daily and she will follow-up with her providers for further management.

## 2023-08-19 NOTE — Assessment & Plan Note (Signed)
-   The patient has responded to IV adenosine. - She will be admitted to an observation cardiac telemetry bed for further my drink. - We will optimize and hypokalemia. - Cardiology consult to be obtained. - I notified [facility] group about the patient.

## 2023-08-19 NOTE — ED Notes (Signed)
RN aware bed assigned ?

## 2023-08-19 NOTE — ED Provider Notes (Addendum)
 [facility] Provider Note    Event Date/Time   First MD Initiated Contact with Patient 08/19/23 0006     (approximate)   History   Tachycardia   HPI  Michelle Marshall is a 58 y.o. female   Past medical history of diabetes and hypertension presents via with tachycardia.  This evening she was in her normal state of health, doing her normal activities, when she developed palpitations sense of heart racing, chest discomfort, shortness of breath and lightheadedness.  She called the medics which tried vagal maneuvers unsuccessful and found her to be in SVT and converted with 6 of adenosine.  She is completely asymptomatic now.  She denies any recent illnesses except for influenza 2 weeks ago from which she fully recovered.  No GI symptoms no chest pain or shortness of breath preceding.  She is completely asymptomatic now.  No medications changes recently or drug or alcohol use.  Independent Historian contributed to assessment above: Family members are at bedside to corroborate information and past medical history    Physical Exam   Triage Vital Signs: ED Triage Vitals  Encounter Vitals Group     BP 08/18/23 2341 (!) 144/89     Systolic BP Percentile --      Diastolic BP Percentile --      Pulse Rate 08/18/23 2341 (!) 111     Resp 08/18/23 2341 18     Temp 08/18/23 2341 97.9 F (36.6 C)     Temp Source 08/18/23 2341 Oral     SpO2 08/18/23 2341 95 %     Weight 08/18/23 2334 180 lb (81.6 kg)     Height 08/18/23 2334 5\' 9"  (1.753 m)     Head Circumference --      Peak Flow --      Pain Score 08/18/23 2334 0     Pain Loc --      Pain Education --      Exclude from Growth Chart --     Most recent vital signs: Vitals:   08/19/23 0000 08/19/23 0035  BP:  (!) 148/108  Pulse: (!) 111 (!) 108  Resp: (!) 22 (!) 21  Temp:    SpO2: 95% 96%    General: Awake, no distress.  CV:  Good peripheral perfusion. Resp:  Normal effort.  Abd:  No distention.   Other:  Mildly tachycardic in the 100s otherwise vital signs normal, well-appearing patient no acute distress.  No peripheral edema.  Heart sounds regular rhythm slightly tachycardic lungs clear.   ED Results / Procedures / Treatments   Labs (all labs ordered are listed, but only abnormal results are displayed) Labs Reviewed  CBC - Abnormal; Notable for the following components:      Result Value   WBC 11.5 (*)    All other components within normal limits  BASIC METABOLIC PANEL - Abnormal; Notable for the following components:   Potassium 3.1 (*)    CO2 19 (*)    Glucose, Bld 142 (*)    Anion gap 19 (*)    All other components within normal limits  TROPONIN I (HIGH SENSITIVITY) - Abnormal; Notable for the following components:   Troponin I (High Sensitivity) 26 (*)    All other components within normal limits  TROPONIN I (HIGH SENSITIVITY) - Abnormal; Notable for the following components:   Troponin I (High Sensitivity) 143 (*)    All other components within normal limits  MAGNESIUM   TSH  T4, FREE  I ordered and reviewed the above labs they are notable for white blood cell, LE elevated 11.5.  Potassium slightly low at 3.1.  Troponin is 26 initially.  EKG  ED ECG REPORT I, Buell Carmin, the attending physician, personally viewed and interpreted this ECG.   Date: 08/19/2023  EKG Time: 2333  Rate: 111  Rhythm: sinus tachycardia  Axis: nl  Intervals:none  ST&T Change: no stemi    RADIOLOGY I independently reviewed and interpreted chest x-ray and I see no obvious focality pneumothorax I also reviewed radiologist's formal read.   PROCEDURES:  Critical Care performed: Yes, see critical care procedure note(s)  .Critical Care  Performed by: Buell Carmin, MD Authorized by: Buell Carmin, MD   Critical care provider statement:    Critical care time (minutes):  30   Critical care was time spent personally by me on the following activities:  Development of treatment  plan with patient or surrogate, discussions with consultants, evaluation of patient's response to treatment, examination of patient, ordering and review of laboratory studies, ordering and review of radiographic studies, ordering and performing treatments and interventions, pulse oximetry, re-evaluation of patient's condition and review of old charts    MEDICATIONS ORDERED IN ED: Medications  aspirin  chewable tablet 324 mg (has no administration in time range)  potassium chloride  SA (KLOR-CON  M) CR tablet 40 mEq (40 mEq Oral Given 08/19/23 0052)  alum & mag hydroxide-simeth (MAALOX/MYLANTA) 200-200-20 MG/5ML suspension 30 mL (30 mLs Oral Given 08/19/23 0135)    And  lidocaine  (XYLOCAINE ) 2 % viscous mouth solution 15 mL (15 mLs Oral Given 08/19/23 0135)     IMPRESSION / MDM / ASSESSMENT AND PLAN / ED COURSE  I reviewed the triage vital signs and the nursing notes.                                Patient's presentation is most consistent with acute presentation with potential threat to life or bodily function.  Differential diagnosis includes, but is not limited to, SVT, dysrhythmia, electrolyte disturbance, considered but less likely ACS or PE   The patient is on the cardiac monitor to evaluate for evidence of arrhythmia and/or significant heart rate changes.  MDM:    Is a patient with SVT that converted with 6 mg of adenosine is completely asymptomatic now.  No obvious inciting event and this otherwise pretty healthy 58 year old woman.  Check electrolytes, basic labs, maintain on cardiac monitor, thyroid  function testing as well, and if unremarkable and remains asymptomatic plan for discharge with PMD/cardiology follow-up.   First troponin mildly elevated 20s I think driven by her SVT.  She has no chest pain to suggest cardiac ischemia, and EKG shows no ischemic changes.   -- Her second troponin came back markedly elevated with first now 150s from 29s.  I do still think that this is  probably a [place] leak from her SVT earlier rather than cardiac ischemia ongoing given no chest pain currently and nonischemic EKG when in sinus tachycardia.  When she had the event she did have chest pain I think this was driven by the SVT itself.  However given the significant increase, will give her aspirin , and offered admission for observation on cardiac monitoring and trending of troponin to ensure stabilization and resolution.   FINAL CLINICAL IMPRESSION(S) / ED DIAGNOSES   Final diagnoses:  SVT (supraventricular tachycardia) (HCC)  Hypokalemia  Troponin level elevated     Rx /  DC Orders   ED Discharge Orders     None        Note:  This document was prepared using Dragon voice recognition software and may include unintentional dictation errors.    Buell Carmin, MD 08/19/23 1914    Buell Carmin, MD 08/19/23 [phone]

## 2023-08-19 NOTE — Discharge Instructions (Addendum)
 Michelle Marshall

## 2023-08-19 NOTE — Consult Note (Signed)
 Cardiology Consultation   Patient ID: Michelle Marshall MRN: [id number]; DOB: Apr 04, 1966  Admit date: 08/18/2023 Date of Consult: 08/19/2023  PCP:  Gabriel John, NP   [facility] Providers Cardiologist:  None        Patient Profile:   Michelle Marshall is a 58 y.o. female with a hx of T2DM, depression, essential hypertension, and osteoarthritis who is being seen 08/19/2023 for the evaluation of supraventricular tachycardia at the request of Dr. Ariel Begun.  History of Present Illness:   [name] has no prior cardiac history and has never seen a cardiologist before. She reports she was sick last week with nausea and vomiting which has since resolved. She tells me that on the evening of 2/9 she felt her heart racing with associated shortness of breath for ~ 1 hour which prompted her to call EMS. Upon arrival, EMS found the patient to be in SVT with rate up to 180 bpm. Vagal maneuvers were unsuccessful. Patient was given 6 mg of adenosine with conversion to sinus tachycardia. This relieved patient's symptoms. She denies chest pain, diaphoresis, lightheadedness, and dizziness. She tells me she has had feelings of her heart racing in the past but not as severe as this episode, and she has never seen a provider for this. She denies alcohol, tobacco, and illicit drug use. She is currently asymptomatic.   In the ED, HR 111, BP 144/89, RR 18, T 97.38F, and spO2 95%. BMP notable for K 3.1. Troponin mildly elevated at 26>143. Magnesium , TSH, and T4 within normal limits. CBC with WBC 11.5. EKG showed sinus tachycardia, rate 111 bpm. CXR without acute process. Patient was given potassium.   Past Medical History:  Diagnosis Date   Abdominal mass 04/06/2020   Abnormal uterine bleeding 08/21/2015   May 2018-spotting associated mild cramping   Acute pain of left knee 10/15/2018   Acute pain of right foot 08/31/2020   Amenorrhea 01/25/2016   Anemia    Arthritis    Complication of  anesthesia 2002   DURING SCAR TISSUE EXCISION FROM FALLOPIAN TUBES(2002), PT WAS GIVEN GENERAL ANESTHESIA AND PT BEGAN HAVING A REACTION TO THE ANESTHESIA AND LIPS AND TONGUE STARTED SWELLING AND THE SURGERY HAD TO BE STOPPED DUE TO AIRWAY CLOSING UP-PT HAD GA PRIOR TO THIS FOR HER GALLBLADDER WITH NO PROBLEM PREVIOUSLY   Depression    Diabetes mellitus without complication (HCC)    Dysrhythmia    H/O TACHYCARDIA. stress and anxiety brings it on   Family history of adverse reaction to anesthesia    BROTHER-HARD TIME WAKING UP   Grief 04/23/2018   Recent loss of husband August 2019-brain aneurysm   Hypertension    Irregular periods    Postop check-status post hysteroscopy/D&C 10/09/2017   Status post endometrial ablation 01/25/2016   Vitamin D deficiency    per patient, no longer a problem    Past Surgical History:  Procedure Laterality Date   CHOLECYSTECTOMY     DILATATION & CURETTAGE/HYSTEROSCOPY WITH MYOSURE N/A 09/30/2017   Procedure: DILATATION & CURETTAGE/HYSTEROSCOPY;  Surgeon: Colan Dash, MD;  Location: [facility];  Service: Gynecology;  Laterality: N/A;   DILITATION & CURRETTAGE/HYSTROSCOPY WITH NOVASURE ABLATION N/A 09/19/2015   Procedure: DILATATION & CURETTAGE/HYSTEROSCOPY WITH NOVASURE ABLATION;  Surgeon: Colan Dash, MD;  Location: [facility];  Service: Gynecology;  Laterality: N/A;   INSERTION OF MESH  06/08/2021   Procedure: INSERTION OF MESH;  Surgeon: Alben Alma, MD;  Location: [facility];  Service: General;;  JOINT REPLACEMENT Left    30 years ago. knee   KNEE ARTHROSCOPY Left    VENTRAL HERNIA REPAIR N/A 06/08/2021   Procedure: HERNIA REPAIR VENTRAL ADULT, incisional;  Surgeon: Alben Alma, MD;  Location: [facility];  Service: General;  Laterality: N/A;       Inpatient Medications: Scheduled Meds:  amLODipine   5 mg Oral Daily   busPIRone   5 mg Oral BID   carvedilol   12.5 mg Oral BID WC   glipiZIDE   10 mg Oral Q breakfast    hydrochlorothiazide   25 mg Oral Daily   metoprolol  tartrate  100 mg Oral Once   multivitamin with minerals  1 tablet Oral Daily   potassium chloride  SA  20 mEq Oral Daily   sertraline   125 mg Oral Daily   traZODone   150 mg Oral QHS   Continuous Infusions:  sodium chloride  100 mL/hr at 08/19/23 0546   heparin  1,050 Units/hr (08/19/23 0731)   PRN Meds: acetaminophen  **OR** acetaminophen , EPINEPHrine , magnesium  hydroxide, ondansetron  **OR** ondansetron  (ZOFRAN ) IV, traZODone   Allergies:    Allergies  Allergen Reactions   Lisinopril Swelling    Lips and face   Losartan  Other (See Comments)    Mouth swollen, rash and itching   Wellbutrin [Bupropion] Hives and Swelling    Swelling of mouth   Amlodipine  Swelling   Percocet [Oxycodone -Acetaminophen ] Anxiety    Social History:   Social History   Socioeconomic History   Marital status: Widowed    Spouse name: Not on file   Number of children: 1   Years of education: Not on file   Highest education level: Not on file  Occupational History   Not on file  Tobacco Use   Smoking status: Some Days    Current packs/day: 0.00    Average packs/day: 0.2 packs/day for 20.0 years (3.0 ttl pk-yrs)    Types: Cigarettes    Start date: 09/05/1995    Last attempt to quit: 09/05/2015    Years since quitting: 7.9   Smokeless tobacco: Never  Vaping Use   Vaping status: Never Used  Substance and Sexual Activity   Alcohol use: Not Currently    Alcohol/week: 1.0 standard drink of alcohol    Types: 1 Glasses of wine per week    Comment: occasional glass of wine   Drug use: No   Sexual activity: Yes    Birth control/protection: None, Post-menopausal    Comment: ablation  Other Topics Concern   Not on file  Social History Narrative   Married.   1 child.   She works as a Chartered loss adjuster   Enjoys shopping, traveling.    Social Drivers of Corporate investment banker Strain: Not on file  Food Insecurity: Not on file  Transportation Needs:  Not on file  Physical Activity: Inactive (05/15/2018)   Exercise Vital Sign    Days of Exercise per Week: 0 days    Minutes of Exercise per Session: 0 min  Stress: Not on file  Social Connections: Not on file  Intimate Partner Violence: Not on file    Family History:    Family History  Problem Relation Age of Onset   Cancer Father        throat   Hypertension Mother    Diabetes Paternal Grandmother    Breast cancer Neg Hx     ROS:  Please see the history of present illness.   Physical Exam/Data:   Vitals:   08/19/23 0551 08/19/23 0600 08/19/23 0630  08/19/23 0722  BP:  132/76 128/89   Pulse:  98 93   Resp:  17 18   Temp: 97.9 F (36.6 C)     TempSrc: Oral     SpO2:  93% 95% 95%  Weight:      Height:       No intake or output data in the 24 hours ending 08/19/23 0828    08/18/2023   11:34 PM 07/24/2023    8:39 AM 01/21/2023    8:48 AM  Last 3 Weights  Weight (lbs) 180 lb 185 lb 184 lb 6.4 oz  Weight (kg) 81.647 kg 83.915 kg 83.643 kg     Body mass index is 26.58 kg/m.  General:  Well nourished, well developed, in no acute distress HEENT: normal Neck: no JVD Cardiac:  normal S1, S2; RRR; no murmur  Lungs:  clear to auscultation bilaterally, no wheezing, rhonchi or rales  Abd: soft, nontender, no hepatomegaly  Ext: no edema Skin: warm and dry  Psych:  Normal affect   EKG:  The EKG was personally reviewed and demonstrates:  sinus tachycardia, rate 111 bpm Telemetry:  Telemetry was personally reviewed and demonstrates:  sinus rhythm with occasional PVCs  Laboratory Data:  High Sensitivity Troponin:   Recent Labs  Lab 08/18/23 2338 08/19/23 0113  TROPONINIHS 26* 143*     Chemistry Recent Labs  Lab 08/18/23 2338 08/19/23 0545  NA 140 138  K 3.1* 3.6  CL 102 104  CO2 19* 25  GLUCOSE 142* 109*  BUN 16 16  CREATININE 0.80 0.63  CALCIUM 8.9 8.8*  MG 2.2  --   GFRNONAA >60 >60  ANIONGAP 19* 9    No results for input(s): "PROT", "ALBUMIN", "AST",  "ALT", "ALKPHOS", "BILITOT" in the last 168 hours. Lipids No results for input(s): "CHOL", "TRIG", "HDL", "LABVLDL", "LDLCALC", "CHOLHDL" in the last 168 hours.  Hematology Recent Labs  Lab 08/18/23 2338 08/19/23 0545  WBC 11.5* 10.1  RBC 4.26 3.92  HGB 14.0 12.7  HCT 40.9 36.1  MCV 96.0 92.1  MCH 32.9 32.4  MCHC 34.2 35.2  RDW 12.8 12.8  PLT 326 313   Thyroid   Recent Labs  Lab 08/18/23 2338  TSH 2.233  FREET4 0.90    BNPNo results for input(s): "BNP", "PROBNP" in the last 168 hours.  DDimer No results for input(s): "DDIMER" in the last 168 hours.   Radiology/Studies:  [facility] Chest Port 1 View Result Date: 08/19/2023 IMPRESSION: No active disease. Electronically Signed   By: Virgle Grime M.D.   On: 08/19/2023 00:47   Assessment and Plan:   Paroxysmal supraventricular tachycardia - Presented 2/9 with symptoms of palpitations and dyspnea, EMS found patient to be in SVT. Converted to sinus rhythm with adenosine.  - K 3.1 on admission, repletion given - TSH/T4 and magnesium  within normal limits - Echo ordered - Telemetry shows normal sinus rhythm with PVCs - Consider increasing PTA carvedilol  to prevent episodes if BP allows   Elevated troponin - Troponin 26>143, will continue to trend - Denies chest pain - Suspect demand ischemia secondary to SVT with rates up to 180 bpm - Will obtain cardiac CTA for further ischemic evaluation, tentatively scheduled for today  Hypokalemia - K 3.1>3.6, continue to monitor and replete as needed for K > 4 - Suspect secondary to vomiting  Essential hypertension - BP elevated on arrival, now controlled  For questions or updates, please contact  [facility] Please consult [email or web address] for contact info under  Signed, Brodie Cannon, PA-C  08/19/2023 8:28 AM

## 2023-08-19 NOTE — ED Notes (Signed)
 Pt to Coronary CT

## 2023-08-19 NOTE — Progress Notes (Signed)
*  PRELIMINARY RESULTS* Echocardiogram 2D Echocardiogram has been performed.  Palmer Bobo 08/19/2023, 3:45 PM

## 2023-08-19 NOTE — Discharge Summary (Signed)
 Physician Discharge Summary   Patient: Michelle Marshall MRN: [id number] DOB: [birth year]  Admit date:     08/18/2023  Discharge date: 08/19/23  Discharge Physician: Luna Salinas   PCP: Gabriel John, NP   Recommendations at discharge:  Please obtain CBC and BMP and follow-up Please obtain lipid profile and add Crestor if needed Follow-up with cardiology-they will likely refer her for EP evaluation Follow-up with primary care provider  Discharge Diagnoses: Principal Problem:   SVT (supraventricular tachycardia) (HCC) Active Problems:   Elevated troponin I level   Hypokalemia   Essential hypertension   Anxiety and depression   Troponin level elevated   Hospital Course: Taken from H&P.  Michelle Marshall is a 58 y.o. African-American female with medical history significant for type 2 diabetes mellitus, depression, essential hypertension and osteoarthritis, who presented to the emergency room with acute onset of tachycardia with palpitations.  She was in normal state of health doing her normal activities evening when she developed palpitations with associated chest discomfort, dyspnea and lightheadedness.  She called EMS they tried vagal maneuver without success.  She was found to be in SVT and was given 6 mg of IV adenosine with successful conversion.  History of recent influenza-like symptoms a couple of weeks ago which has been recovered.  On presentation mildly elevated blood pressure, heart rate 111, afebrile and no hypoxia.  Labs with hypokalemia at 3.1, CO2 19, blood glucose of 142 and anion gap of 19.  Troponin 26>> 143, leukocytosis at 11.5, TSH 2.23 and free T4 0.9. EKG with sinus tachycardia, T wave inversion inferiorly. Chest x-ray with no acute cardiopulmonary disease.  Cardiology was consulted, they are recommending coronary CTA, Pending echocardiogram.  2/10: Vital stable, potassium improved to 3.6, troponin slowly increasing, currently at 184, CTA coronary  with calcium score of 1.31.  Minimum RCA stenosis, echocardiogram with normal EF, grade 1 diastolic dysfunction and no regional wall motion abnormalities.  Rest of the echo was also unremarkable. Cardiology increased the dose of carvedilol  and they will follow-up as outpatient as she might need evaluation by EP.  Patient will continue on her current medications, carvedilol  dose was increased from 12.5 mg twice daily to 25 mg twice daily and she will follow-up with her providers for further management.     Consultants: Cardiology Procedures performed: None  Disposition: Home Diet recommendation:  Discharge Diet Orders (From admission, onward)     Start     Ordered   08/19/23 0000  Diet - low sodium heart healthy        08/19/23 1645           Cardiac and Carb modified diet DISCHARGE MEDICATION: Allergies as of 08/19/2023       Reactions   Lisinopril Swelling   Lips and face   Losartan  Other (See Comments)   Mouth swollen, rash and itching   Wellbutrin [bupropion] Hives, Swelling   Swelling of mouth   Amlodipine  Swelling   Percocet [oxycodone -acetaminophen ] Anxiety        Medication List     TAKE these medications    amLODipine  5 MG tablet Commonly known as: NORVASC  Take 1 tablet (5 mg total) by mouth daily. for blood pressure.   busPIRone  5 MG tablet Commonly known as: BUSPAR  TAKE 1 TABLET BY MOUTH 2 TIMES DAILY. FOR ANXIETY   carvedilol  25 MG tablet Commonly known as: COREG  Take 1 tablet (25 mg total) by mouth 2 (two) times daily with a meal. TAKE 1 TABLET(12.5 MG) BY MOUTH  TWICE DAILY WITH A MEAL for heart rate What changed:  medication strength See the new instructions.   cyclobenzaprine  5 MG tablet Commonly known as: FLEXERIL  Take 1-2 tablets (5-10 mg total) by mouth 3 (three) times daily as needed for muscle spasms.   EPINEPHrine  0.3 mg/0.3 mL Soaj injection Commonly known as: EPI-PEN Inject 0.3 mg into the muscle as needed for anaphylaxis.    estradiol -norethindrone  1-0.5 MG tablet Commonly known as: Mimvey Take 1 tablet by mouth daily.   glipiZIDE  10 MG 24 hr tablet Commonly known as: GLUCOTROL  XL Take 1 tablet (10 mg total) by mouth daily with breakfast. For diabetes. Office visit required for further refills.   hydrochlorothiazide  25 MG tablet Commonly known as: HYDRODIURIL  Take 1 tablet (25 mg total) by mouth daily. What changed: See the new instructions.   ibuprofen  800 MG tablet Commonly known as: ADVIL  Take 1 tablet (800 mg total) by mouth every 8 (eight) hours as needed for moderate pain.   Klor-Con  M20 20 MEQ tablet Generic drug: potassium chloride  SA TAKE 1 TABLET (20 MEQ TOTAL) BY MOUTH DAILY. FOR LOW POTASSIUM.   multivitamin tablet Take 1 tablet by mouth daily.   Ozempic  (2 MG/DOSE) 8 MG/3ML Sopn Generic drug: Semaglutide  (2 MG/DOSE) INJECT 2 MG AS DIRECTED ONCE A WEEK. FOR DIABETES.   sertraline  25 MG tablet Commonly known as: ZOLOFT  TAKE 1 TABLET BY MOUTH DAILY. FOR ANXIETY. TAKE WITH 100 MG TABLET.   sertraline  100 MG tablet Commonly known as: ZOLOFT  TAKE 1 TABLET(100 MG) BY MOUTH DAILY FOR ANXIETY. TAKE WITH 25 MG DOSE.   traZODone  150 MG tablet Commonly known as: DESYREL  TAKE 1 TABLET BY MOUTH EVERY DAY AT BEDTIME FOR SLEEP   valACYclovir  1000 MG tablet Commonly known as: Valtrex  Take 2 tablets (2,000 mg total) by mouth 2 (two) times daily. For one day        Follow-up Information     Gabriel John, NP. Schedule an appointment as soon as possible for a visit in 1 week(s).   Specialty: Internal Medicine Contact information: [address] [place] [address] [phone]         Sammy Crisp, MD. Schedule an appointment as soon as possible for a visit in 1 week(s).   Specialty: Cardiology Contact information: [address] [place] [address] [phone]                Discharge Exam: Michelle Marshall   08/18/23 2334  Weight: 81.6  kg   General. Well developed lady, In no acute distress. Pulmonary.  Lungs clear bilaterally, normal respiratory effort. CV.  Regular rate and rhythm, no JVD, rub or murmur. Abdomen.  Soft, nontender, nondistended, BS positive. CNS.  Alert and oriented .  No focal neurologic deficit. Extremities.  No edema, no cyanosis, pulses intact and symmetrical. Psychiatry.  Judgment and insight appears normal.   Condition at discharge: stable  The results of significant diagnostics from this hospitalization (including imaging, microbiology, ancillary and laboratory) are listed below for reference.   Imaging Studies: ECHOCARDIOGRAM COMPLETE Result Date: 08/19/2023    ECHOCARDIOGRAM REPORT   Patient Name:   Shadaya Selvey Date of Exam: 08/19/2023 Medical Rec #:  [id number]           Height:       69.0 in Accession #:    [id number]          Weight:       180.0 lb Date of Birth:  01/27/1966  BSA:          1.976 m Patient Age:    57 years            BP:           94/54 mmHg Patient Gender: F                   HR:           68 bpm. Exam Location:  [facility] Procedure: 2D Echo, Cardiac Doppler and Color Doppler Indications:     Elevated Troponin  History:         Patient has no prior history of Echocardiogram examinations.                  Risk Factors:Hypertension, Current Smoker and Diabetes.  Sonographer:     Adelia Homestead RVT RCS Referring Phys:  [id number] Anastasio Kaska MANSY Diagnosing Phys: Sammy Crisp MD IMPRESSIONS  1. Left ventricular ejection fraction, by estimation, is 60 to 65%. The left ventricle has normal function. The left ventricle has no regional wall motion abnormalities. There is mild left ventricular hypertrophy. Left ventricular diastolic parameters are consistent with Grade I diastolic dysfunction (impaired relaxation).  2. Right ventricular systolic function is normal. The right ventricular size is normal. Tricuspid regurgitation signal is inadequate for assessing PA pressure.  3. The mitral  valve is normal in structure. Trivial mitral valve regurgitation. No evidence of mitral stenosis.  4. The aortic valve is tricuspid. Aortic valve regurgitation is not visualized. No aortic stenosis is present.  5. The inferior vena cava is normal in size with greater than 50% respiratory variability, suggesting right atrial pressure of 3 mmHg. FINDINGS  Left Ventricle: Left ventricular ejection fraction, by estimation, is 60 to 65%. The left ventricle has normal function. The left ventricle has no regional wall motion abnormalities. The left ventricular internal cavity size was normal in size. There is  mild left ventricular hypertrophy. Left ventricular diastolic parameters are consistent with Grade I diastolic dysfunction (impaired relaxation). Right Ventricle: The right ventricular size is normal. No increase in right ventricular wall thickness. Right ventricular systolic function is normal. Tricuspid regurgitation signal is inadequate for assessing PA pressure. Left Atrium: Left atrial size was normal in size. Right Atrium: Right atrial size was normal in size. Pericardium: There is no evidence of pericardial effusion. Mitral Valve: The mitral valve is normal in structure. Trivial mitral valve regurgitation. No evidence of mitral valve stenosis. Tricuspid Valve: The tricuspid valve is normal in structure. Tricuspid valve regurgitation is mild. Aortic Valve: The aortic valve is tricuspid. Aortic valve regurgitation is not visualized. No aortic stenosis is present. Aortic valve mean gradient measures 3.0 mmHg. Aortic valve peak gradient measures 5.4 mmHg. Aortic valve area, by VTI measures 2.50 cm. Pulmonic Valve: The pulmonic valve was not well visualized. Pulmonic valve regurgitation is not visualized. No evidence of pulmonic stenosis. Aorta: The aortic root and ascending aorta are structurally normal, with no evidence of dilitation. Pulmonary Artery: The pulmonary artery is of normal size. Venous: The inferior  vena cava is normal in size with greater than 50% respiratory variability, suggesting right atrial pressure of 3 mmHg. IAS/Shunts: No atrial level shunt detected by color flow Doppler.  LEFT VENTRICLE PLAX 2D LVIDd:         3.30 cm   Diastology LVIDs:         2.20 cm   LV e' medial:    5.51 cm/s LV PW:  1.20 cm   LV E/e' medial:  10.1 LV IVS:        1.20 cm   LV e' lateral:   7.93 cm/s LVOT diam:     2.00 cm   LV E/e' lateral: 7.0 LV SV:         66 LV SV Index:   34 LVOT Area:     3.14 cm  RIGHT VENTRICLE             IVC RV Basal diam:  2.90 cm     IVC diam: 2.00 cm RV S prime:     11.10 cm/s TAPSE (M-mode): 2.3 cm LEFT ATRIUM             Index        RIGHT ATRIUM           Index LA Vol (A2C):   35.0 ml 17.72 ml/m  RA Area:     10.80 cm LA Vol (A4C):   42.8 ml 21.67 ml/m  RA Volume:   22.60 ml  11.44 ml/m LA Biplane Vol: 38.8 ml 19.64 ml/m  AORTIC VALVE                    PULMONIC VALVE AV Area (Vmax):    2.70 cm     PV Vmax:       0.82 m/s AV Area (Vmean):   2.53 cm     PV Peak grad:  2.7 mmHg AV Area (VTI):     2.50 cm AV Vmax:           116.00 cm/s AV Vmean:          81.300 cm/s AV VTI:            0.265 m AV Peak Grad:      5.4 mmHg AV Mean Grad:      3.0 mmHg LVOT Vmax:         99.60 cm/s LVOT Vmean:        65.500 cm/s LVOT VTI:          0.211 m LVOT/AV VTI ratio: 0.80  AORTA Ao Root diam: 2.90 cm Ao Asc diam:  2.90 cm MITRAL VALVE MV Area (PHT): 3.31 cm    SHUNTS MV Decel Time: 229 msec    Systemic VTI:  0.21 m MV E velocity: 55.50 cm/s  Systemic Diam: 2.00 cm MV A velocity: 81.80 cm/s MV E/A ratio:  0.68 Veryl Gottron End MD Electronically signed by Sammy Crisp MD Signature Date/Time: 08/19/2023/4:43:34 PM    Final    CT CORONARY MORPH W/CTA COR W/SCORE Huston Maiers W/CM &/OR WO/CM Addendum Date: 08/19/2023 ADDENDUM REPORT: 08/19/2023 15:15 EXAM: OVER-READ INTERPRETATION CARDIAC CT CHEST The following report is an over-read performed by radiologist Dr. Freida Jes of [facility]  on 08/19/2023. This over-read does not include interpretation of cardiac or coronary anatomy or pathology. The cardiac interpretation by the cardiologist is attached or will be attached. COMPARISON:  Overlapping portions CT abdomen 04/10/2009 FINDINGS: Extracardiac Vascular: Unremarkable Mediastinum: Calcified left infrahilar lymph nodes compatible with old granulomatous disease. Lung: Unremarkable Included Upper Abdomen: Unremarkable Musculoskeletal: Unremarkable IMPRESSION: 1. Calcified left infrahilar lymph nodes compatible with old granulomatous disease. Electronically Signed   By: Freida Jes M.D.   On: 08/19/2023 15:15   Result Date: 08/19/2023 CLINICAL DATA:  SVT, elevated troponin EXAM: Cardiac/Coronary  CTA TECHNIQUE: The patient was scanned on a Siemens Somatom scanner. : A prospective scan was triggered in the ascending thoracic aorta.  Axial non-contrast 3 mm slices were carried out through the heart. The data set was analyzed on a dedicated work station and scored using the Agatson method. Gantry rotation speed was 66 msecs and collimation was .6 mm. 100mg  of metoprolol  and 0.8 mg of sl NTG was given. The 3D data set was reconstructed in 5% intervals of the 60-95 % of the R-R cycle. Diastolic phases were analyzed on a dedicated work station using MPR, MIP and VRT modes. The patient received 100 cc of contrast. FINDINGS: Aorta:  Normal size.  No calcifications.  No dissection. Aortic Valve:  Trileaflet.  No calcifications. Coronary Arteries:  Normal coronary origin.  Right dominance. RCA is a dominant artery. There is calcified plaque proximally causing minimal stenosis (<25%). Left main gives rise to LAD and LCX arteries. LM has no disease. LAD has no plaque. LCX is a non-dominant artery.  There is no plaque. Other findings: Normal pulmonary vein drainage into the left atrium. Normal left atrial appendage without a thrombus. Normal size of the pulmonary artery. IMPRESSION: 1. Coronary calcium  score of 1.31. This was 73rd percentile for age and sex matched control. 2. Normal coronary origin with right dominance. 3. Minimal proximal RCA stenosis (<25%). 4. CAD-RADS 1. Minimal non-obstructive CAD (0-24%). Consider preventive therapy and risk factor modification. Electronically Signed: By: Constancia Delton M.D. On: 08/19/2023 13:11   DG Chest Port 1 View Result Date: 08/19/2023 CLINICAL DATA:  Chest pain and shortness of breath. EXAM: PORTABLE CHEST 1 VIEW COMPARISON:  March 17, 2013 FINDINGS: The heart size and mediastinal contours are within normal limits. Both lungs are clear. The visualized skeletal structures are unremarkable. IMPRESSION: No active disease. Electronically Signed   By: Virgle Grime M.D.   On: 08/19/2023 00:47    Microbiology: Results for orders placed or performed in visit on 09/04/17  Urine Culture     Status: None   Collection Time: 09/04/17  1:01 PM   Specimen: Urine  Result Value Ref Range Status   MICRO NUMBER: [id number]  Final   SPECIMEN QUALITY: ADEQUATE  Final   Sample Source NOT GIVEN  Final   STATUS: FINAL  Final   ISOLATE 1:   Final    Multiple organisms present, each less than 10,000 CFU/mL. These organisms, commonly found on external and internal genitalia, are considered to be colonizers. No further testing performed.    Labs: CBC: Recent Labs  Lab 08/18/23 2338 08/19/23 0545  WBC 11.5* 10.1  HGB 14.0 12.7  HCT 40.9 36.1  MCV 96.0 92.1  PLT 326 313   Basic Metabolic Panel: Recent Labs  Lab 08/18/23 2338 08/19/23 0545  NA 140 138  K 3.1* 3.6  CL 102 104  CO2 19* 25  GLUCOSE 142* 109*  BUN 16 16  CREATININE 0.80 0.63  CALCIUM 8.9 8.8*  MG 2.2  --    Liver Function Tests: No results for input(s): "AST", "ALT", "ALKPHOS", "BILITOT", "PROT", "ALBUMIN" in the last 168 hours. CBG: No results for input(s): "GLUCAP" in the last 168 hours.  Discharge time spent: greater than 30 minutes.  This record has been created using  Conservation officer, historic buildings. Errors have been sought and corrected,but may not always be located. Such creation errors do not reflect on the standard of care.   Signed: Luna Salinas, MD [facility] 08/19/2023

## 2023-08-19 NOTE — H&P (Signed)
 [place]   PATIENT NAME: Michelle Marshall    MR#:  [id number]  DATE OF BIRTH:  Nov 25, 1965  DATE OF ADMISSION:  08/18/2023  PRIMARY CARE PHYSICIAN: Gabriel John, NP   Patient is coming from: Home  REQUESTING/REFERRING PHYSICIAN: Buell Carmin, MD  CHIEF COMPLAINT:   Chief Complaint  Patient presents with   Tachycardia    HISTORY OF PRESENT ILLNESS:  Michelle Marshall is a 58 y.o. African-American female with medical history significant for type 2 diabetes mellitus, depression, essential hypertension and osteoarthritis, who presented to the emergency room with acute onset of tachycardia with palpitations.  She was in normal state of health doing her normal activities evening when she developed palpitations with associated chest discomfort, dyspnea and lightheadedness.  She called EMS they tried vagal maneuver without success.  She was found to be in SVT and was given 6 mg of IV adenosine with successful conversion.  She was then symptomatic.  She admitted to recent influenza-like symptoms a couple weeks ago that she has recovered from.  No nausea or vomiting or abdominal pain.  No cough or wheezing or dyspnea.  No dysuria, oliguria or hematuria or flank pain.  No fever or chills.  No bleeding diathesis.  ED Course: When she came to the ER, BP was 144/89 with heart rate 111 and respiratory rate of 18 and pulse oximetry 95% on room air with temperature 97.9.  Labs revealed hypokalemia 3.1 with a CO2 of 19 and blood glucose of 142 and anion gap of 19.  High sensitive troponin I was 26 and later 143.  CBC showed mild leukocytosis of 11.5.  TSH was 2.23 and free T4 was 0.9. EKG as reviewed by me : EKG showed sinus tachycardia with a rate of 111 with T wave inversion inferiorly. Imaging: Portable chest x-ray showed no acute cardiopulmonary disease.  The patient was given 4 baby aspirin , GI cocktail and 40 mill equivalent p.o. potassium chloride  patient be admitted to a cardiac  telemetry observation bed for further evaluation and management. PAST MEDICAL HISTORY:   Past Medical History:  Diagnosis Date   Abdominal mass 04/06/2020   Abnormal uterine bleeding 08/21/2015   May 2018-spotting associated mild cramping   Acute pain of left knee 10/15/2018   Acute pain of right foot 08/31/2020   Amenorrhea 01/25/2016   Anemia    Arthritis    Complication of anesthesia 2002   DURING SCAR TISSUE EXCISION FROM FALLOPIAN TUBES(2002), PT WAS GIVEN GENERAL ANESTHESIA AND PT BEGAN HAVING A REACTION TO THE ANESTHESIA AND LIPS AND TONGUE STARTED SWELLING AND THE SURGERY HAD TO BE STOPPED DUE TO AIRWAY CLOSING UP-PT HAD GA PRIOR TO THIS FOR HER GALLBLADDER WITH NO PROBLEM PREVIOUSLY   Depression    Diabetes mellitus without complication (HCC)    Dysrhythmia    H/O TACHYCARDIA. stress and anxiety brings it on   Family history of adverse reaction to anesthesia    BROTHER-HARD TIME WAKING UP   Grief 04/23/2018   Recent loss of husband August 2019-brain aneurysm   Hypertension    Irregular periods    Postop check-status post hysteroscopy/D&C 10/09/2017   Status post endometrial ablation 01/25/2016   Vitamin D deficiency    per patient, no longer a problem    PAST SURGICAL HISTORY:   Past Surgical History:  Procedure Laterality Date   CHOLECYSTECTOMY     DILATATION & CURETTAGE/HYSTEROSCOPY WITH MYOSURE N/A 09/30/2017   Procedure: DILATATION & CURETTAGE/HYSTEROSCOPY;  Surgeon: Dianna Fortis  A, MD;  Location: [facility];  Service: Gynecology;  Laterality: N/A;   DILITATION & CURRETTAGE/HYSTROSCOPY WITH NOVASURE ABLATION N/A 09/19/2015   Procedure: DILATATION & CURETTAGE/HYSTEROSCOPY WITH NOVASURE ABLATION;  Surgeon: Colan Dash, MD;  Location: [facility];  Service: Gynecology;  Laterality: N/A;   INSERTION OF MESH  06/08/2021   Procedure: INSERTION OF MESH;  Surgeon: Alben Alma, MD;  Location: [facility];  Service: General;;   JOINT REPLACEMENT Left    30 years  ago. knee   KNEE ARTHROSCOPY Left    VENTRAL HERNIA REPAIR N/A 06/08/2021   Procedure: HERNIA REPAIR VENTRAL ADULT, incisional;  Surgeon: Alben Alma, MD;  Location: [facility];  Service: General;  Laterality: N/A;    SOCIAL HISTORY:   Social History   Tobacco Use   Smoking status: Some Days    Current packs/day: 0.00    Average packs/day: 0.2 packs/day for 20.0 years (3.0 ttl pk-yrs)    Types: Cigarettes    Start date: 09/05/1995    Last attempt to quit: 09/05/2015    Years since quitting: 7.9   Smokeless tobacco: Never  Substance Use Topics   Alcohol use: Not Currently    Alcohol/week: 1.0 standard drink of alcohol    Types: 1 Glasses of wine per week    Comment: occasional glass of wine    FAMILY HISTORY:   Family History  Problem Relation Age of Onset   Cancer Father        throat   Hypertension Mother    Diabetes Paternal Grandmother    Breast cancer Neg Hx     DRUG ALLERGIES:   Allergies  Allergen Reactions   Lisinopril Swelling    Lips and face   Losartan  Other (See Comments)    Mouth swollen, rash and itching   Wellbutrin [Bupropion] Hives and Swelling    Swelling of mouth   Amlodipine  Swelling   Percocet [Oxycodone -Acetaminophen ] Anxiety    REVIEW OF SYSTEMS:   ROS As per history of present illness. All pertinent systems were reviewed above. Constitutional, HEENT, cardiovascular, respiratory, GI, GU, musculoskeletal, neuro, psychiatric, endocrine, integumentary and hematologic systems were reviewed and are otherwise negative/unremarkable except for positive findings mentioned above in the HPI.   MEDICATIONS AT HOME:   Prior to Admission medications   Medication Sig Start Date End Date Taking? Authorizing Provider  amLODipine  (NORVASC ) 5 MG tablet Take 1 tablet (5 mg total) by mouth daily. for blood pressure. 08/18/23  Yes Clark, Katherine K, NP  busPIRone  (BUSPAR ) 5 MG tablet TAKE 1 TABLET BY MOUTH 2 TIMES DAILY. FOR ANXIETY 05/23/23  Yes Gabriel John, NP  carvedilol  (COREG ) 12.5 MG tablet TAKE 1 TABLET(12.5 MG) BY MOUTH TWICE DAILY WITH A MEAL FOR HEART RATE 03/12/23  Yes Clark, Katherine K, NP  cyclobenzaprine  (FLEXERIL ) 5 MG tablet Take 1-2 tablets (5-10 mg total) by mouth 3 (three) times daily as needed for muscle spasms. 06/05/22  Yes Clark, Katherine K, NP  EPINEPHrine  0.3 mg/0.3 mL IJ SOAJ injection Inject 0.3 mg into the muscle as needed for anaphylaxis.   Yes [provider]  glipiZIDE  (GLUCOTROL  XL) 10 MG 24 hr tablet Take 1 tablet (10 mg total) by mouth daily with breakfast. For diabetes. Office visit required for further refills. 09/14/21  Yes Clark, Katherine K, NP  hydrochlorothiazide  (HYDRODIURIL ) 25 MG tablet TAKE 1 TABLET BY MOUTH EVERY DAY FOR BLOOD PRESSURE 04/14/23  Yes Gabriel John, NP  ibuprofen  (ADVIL ) 800 MG tablet Take 1 tablet (  800 mg total) by mouth every 8 (eight) hours as needed for moderate pain. 08/31/20  Yes Clark, Katherine K, NP  Multiple Vitamin (MULTIVITAMIN) tablet Take 1 tablet by mouth daily.   Yes [provider]  potassium chloride  SA (KLOR-CON  M20) 20 MEQ tablet TAKE 1 TABLET (20 MEQ TOTAL) BY MOUTH DAILY. FOR LOW POTASSIUM. 03/12/23  Yes Clark, Katherine K, NP  Semaglutide , 2 MG/DOSE, (OZEMPIC , 2 MG/DOSE,) 8 MG/3ML SOPN INJECT 2 MG AS DIRECTED ONCE A WEEK. FOR DIABETES. 07/01/23  Yes Gabriel John, NP  sertraline  (ZOLOFT ) 100 MG tablet TAKE 1 TABLET(100 MG) BY MOUTH DAILY FOR ANXIETY. TAKE WITH 25 MG DOSE. 02/25/23  Yes Clark, Katherine K, NP  sertraline  (ZOLOFT ) 25 MG tablet TAKE 1 TABLET BY MOUTH DAILY. FOR ANXIETY. TAKE WITH 100 MG TABLET. 02/27/22  Yes Clark, Katherine K, NP  traZODone  (DESYREL ) 150 MG tablet TAKE 1 TABLET BY MOUTH EVERY DAY AT BEDTIME FOR SLEEP 03/15/23  Yes Clark, Katherine K, NP  valACYclovir  (VALTREX ) 1000 MG tablet Take 2 tablets (2,000 mg total) by mouth 2 (two) times daily. For one day 08/16/23  Yes Clark, Katherine K, NP  estradiol -norethindrone   (MIMVEY) 1-0.5 MG tablet Take 1 tablet by mouth daily. 11/21/21 11/21/22  Zenobia Hila, MD      VITAL SIGNS:  Blood pressure (!) 110/54, pulse (!) 103, temperature 97.9 F (36.6 C), temperature source Oral, resp. rate 20, height 5\' 9"  (1.753 m), weight 81.6 kg, SpO2 92%.  PHYSICAL EXAMINATION:  Physical Exam  GENERAL:  58 y.o.-year-old African-American female patient lying in the bed with no acute distress.  EYES: Pupils equal, round, reactive to light and accommodation. No scleral icterus. Extraocular muscles intact.  HEENT: Head atraumatic, normocephalic. Oropharynx and nasopharynx clear.  NECK:  Supple, no jugular venous distention. No thyroid  enlargement, no tenderness.  LUNGS: Normal breath sounds bilaterally, no wheezing, rales,rhonchi or crepitation. No use of accessory muscles of respiration.  CARDIOVASCULAR: Regular rate and rhythm, S1, S2 normal. No murmurs, rubs, or gallops.  ABDOMEN: Soft, nondistended, nontender. Bowel sounds present. No organomegaly or mass.  EXTREMITIES: No pedal edema, cyanosis, or clubbing.  NEUROLOGIC: Cranial nerves II through XII are intact. Muscle strength 5/5 in all extremities. Sensation intact. Gait not checked.  PSYCHIATRIC: The patient is alert and oriented x 3.  Normal affect and good eye contact. SKIN: No obvious rash, lesion, or ulcer.   LABORATORY PANEL:   CBC Recent Labs  Lab 08/18/23 2338  WBC 11.5*  HGB 14.0  HCT 40.9  PLT 326   ------------------------------------------------------------------------------------------------------------------  Chemistries  Recent Labs  Lab 08/18/23 2338  NA 140  K 3.1*  CL 102  CO2 19*  GLUCOSE 142*  BUN 16  CREATININE 0.80  CALCIUM 8.9  MG 2.2   ------------------------------------------------------------------------------------------------------------------  Cardiac Enzymes No results for input(s): "TROPONINI" in the last 168  hours. ------------------------------------------------------------------------------------------------------------------  RADIOLOGY:  DG Chest Port 1 View Result Date: 08/19/2023 CLINICAL DATA:  Chest pain and shortness of breath. EXAM: PORTABLE CHEST 1 VIEW COMPARISON:  March 17, 2013 FINDINGS: The heart size and mediastinal contours are within normal limits. Both lungs are clear. The visualized skeletal structures are unremarkable. IMPRESSION: No active disease. Electronically Signed   By: Virgle Grime M.D.   On: 08/19/2023 00:47      IMPRESSION AND PLAN:  Assessment and Plan: * PSVT (paroxysmal supraventricular tachycardia) (HCC) - The patient has responded to IV adenosine. - She will be admitted to an observation cardiac telemetry bed for further  my drink. - We will optimize and hypokalemia. - Cardiology consult to be obtained. - I notified [facility] group about the patient.  Elevated troponin I level - While this could be related to her PSVT, we will need to rule out acute coronary syndrome specially with associated chest discomfort and dyspnea.  The later symptoms could certainly be related to PSVT as well. - Cardiology consult to be obtained as mentioned above. - Will also obtain a 2D echo.  Hypokalemia - Replace potassium and check magnesium  level.  Anxiety and depression - We will continue BuSpar  and Zoloft  as well as trazodone .  Essential hypertension - We will continue her antihypertensive therapy including beta-blocker therapy with Coreg . - Will give aggressive potassium replacement as mentioned above especially that she is on HCTZ.       DVT prophylaxis: Lovenox.  Advanced Care Planning:  Code Status: full code.  Family Communication:  The plan of care was discussed in details with the patient (and family). I answered all questions. The patient agreed to proceed with the above mentioned plan. Further management will depend upon hospital course. Disposition  Plan: Back to previous home environment Consults called: Cardiology All the records are reviewed and case discussed with ED provider.  Status is: Observation  I certify that at the time of admission, it is my clinical judgment that the patient will require hospital care extending less than 2 midnights.                            Dispo: The patient is from: Home              Anticipated d/c is to: Home              Patient currently is not medically stable to d/c.              Difficult to place patient: No  Virgene Griffin M.D on 08/19/2023 at 5:49 AM  [facility]   From 7 PM-7 AM, contact night-coverage [email or web address]  CC: Primary care physician; Clark, Katherine K, NP

## 2023-08-19 NOTE — Assessment & Plan Note (Signed)
-   We will continue BuSpar  and Zoloft  as well as trazodone .

## 2023-08-19 NOTE — Progress Notes (Signed)
 This RN to speak with Dr End regarding the pt's claustrophobia, verbal order obtained for 1mg  of ativan  iv

## 2023-08-19 NOTE — ED Notes (Signed)
 This RN notified MD, Ariel Begun of trending Hypertensive preassures

## 2023-08-19 NOTE — Progress Notes (Signed)
 Patient tolerated procedure well. Denies any lightheadedness or being dizzy. Pt denies any pain at this time. Pt taken back to ED room 15, report given to ER RN Hailey. Pt is encouraged to drink additional water throughout the day and reason explained to patient. Patient verbalized understanding and all questions answered. ABC intact. No further needs at this time.

## 2023-08-19 NOTE — Assessment & Plan Note (Signed)
 Replace potassium and check magnesium level.

## 2023-08-19 NOTE — Assessment & Plan Note (Signed)
-   We will continue her antihypertensive therapy including beta-blocker therapy with Coreg . - Will give aggressive potassium replacement as mentioned above especially that she is on HCTZ.

## 2023-08-19 NOTE — Assessment & Plan Note (Signed)
-   While this could be related to her PSVT, we will need to rule out acute coronary syndrome specially with associated chest discomfort and dyspnea.  The later symptoms could certainly be related to PSVT as well. - Cardiology consult to be obtained as mentioned above. - Will also obtain a 2D echo.

## 2023-08-19 NOTE — Progress Notes (Signed)
 ANTICOAGULATION CONSULT NOTE  Pharmacy Consult for heparin  infusion Indication: ACS/STEMI  Allergies  Allergen Reactions   Lisinopril Swelling    Lips and face   Losartan  Other (See Comments)    Mouth swollen, rash and itching   Wellbutrin [Bupropion] Hives and Swelling    Swelling of mouth   Amlodipine  Swelling   Percocet [Oxycodone -Acetaminophen ] Anxiety    Patient Measurements: Height: 5\' 9"  (175.3 cm) Weight: 81.6 kg (180 lb) IBW/kg (Calculated) : 66.2 Heparin  Dosing Weight: 81.6 kg  Vital Signs: Temp: 97.9 F (36.6 C) (02/10 0551) Temp Source: Oral (02/10 0551) BP: 128/89 (02/10 0630) Pulse Rate: 93 (02/10 0630)  Labs: Recent Labs    08/18/23 2338 08/19/23 0113 08/19/23 0545  HGB 14.0  --  12.7  HCT 40.9  --  36.1  PLT 326  --  313  CREATININE 0.80  --  0.63  TROPONINIHS 26* 143*  --     Estimated Creatinine Clearance: 89.7 mL/min (by C-G formula based on SCr of 0.63 mg/dL).   Medical History: Past Medical History:  Diagnosis Date   Abdominal mass 04/06/2020   Abnormal uterine bleeding 08/21/2015   May 2018-spotting associated mild cramping   Acute pain of left knee 10/15/2018   Acute pain of right foot 08/31/2020   Amenorrhea 01/25/2016   Anemia    Arthritis    Complication of anesthesia 2002   DURING SCAR TISSUE EXCISION FROM FALLOPIAN TUBES(2002), PT WAS GIVEN GENERAL ANESTHESIA AND PT BEGAN HAVING A REACTION TO THE ANESTHESIA AND LIPS AND TONGUE STARTED SWELLING AND THE SURGERY HAD TO BE STOPPED DUE TO AIRWAY CLOSING UP-PT HAD GA PRIOR TO THIS FOR HER GALLBLADDER WITH NO PROBLEM PREVIOUSLY   Depression    Diabetes mellitus without complication (HCC)    Dysrhythmia    H/O TACHYCARDIA. stress and anxiety brings it on   Family history of adverse reaction to anesthesia    BROTHER-HARD TIME WAKING UP   Grief 04/23/2018   Recent loss of husband August 2019-brain aneurysm   Hypertension    Irregular periods    Postop check-status post  hysteroscopy/D&C 10/09/2017   Status post endometrial ablation 01/25/2016   Vitamin D deficiency    per patient, no longer a problem    Assessment: Pt is a 58 yo female presenting to ED with acute onset of tachycardia with palpitations, found with elevated Troponin I level trending up.  Goal of Therapy:  Heparin  level 0.3-0.7 units/ml Monitor platelets by anticoagulation protocol: Yes   Plan:  Bolus 4000 units x 1 Start heparin  infusion at 1050 units/hr Will check HL in 6 hr after start of infusion CBC daily while on heparin   Coretta Dexter, PharmD, [facility] 08/19/2023 7:03 AM

## 2023-08-20 ENCOUNTER — Telehealth: Payer: Self-pay

## 2023-08-20 NOTE — Telephone Encounter (Signed)
-----   Message from [place] [facility] sent at 08/19/2023  4:38 PM EST ----- Regarding: EP consultation Good afternoon,  Could you arrange for Michelle Marshall to see EP for ED follow-up at her earliest convenience?  She presented with SVT there required adenosine by EMS for restoration of sinus rhythm.  Thanks.  Thayer Ohm

## 2023-08-20 NOTE — Telephone Encounter (Signed)
LVM to schedule ED follow up eith EP, please schedule.

## 2023-09-12 ENCOUNTER — Other Ambulatory Visit: Payer: Self-pay | Admitting: Primary Care

## 2023-09-12 DIAGNOSIS — I10 Essential (primary) hypertension: Secondary | ICD-10-CM

## 2023-09-16 ENCOUNTER — Other Ambulatory Visit: Payer: Self-pay | Admitting: Primary Care

## 2023-09-16 DIAGNOSIS — E1165 Type 2 diabetes mellitus with hyperglycemia: Secondary | ICD-10-CM

## 2023-09-16 NOTE — Progress Notes (Deleted)
  Electrophysiology Office Note:   Date:  09/16/2023  ID:  Michelle Marshall, DOB June 23, 1966, MRN [id number]  Primary Cardiologist: None Electrophysiologist: Nobie Putnam, MD  {Click to update primary MD,subspecialty MD or APP then REFRESH:1}    History of Present Illness:    CC: Michelle Marshall is a 58 y.o. female with h/o diabetes and hypertension who is being seen today for evaluation of SVT at the request of Dr. Okey Dupre.  Discussed the use of AI scribe software for clinical note transcription with the patient, who gave verbal consent to proceed.  History of Present Illness            Review of systems complete and found to be negative unless listed in HPI.   EP Information / Studies Reviewed:    EKG is not ordered today. EKG from 08/18/23 reviewed which showed sinus tachycardia, no obvious pre-excitation. PR and QRS 84ms.      EKG 08/18/23: SVT   Echo 08/19/2023: Normal LV size and function.  LVEF 60 to 65%. Normal RV size and function. No significant valvular disease. Left and right atrium normal in size.  Coronary CTA 08/19/2023: Minimal nonobstructive CAD.  Proximal RCA stenosis less than 25%.  Physical Exam:   VS:  There were no vitals taken for this visit.   Wt Readings from Last 3 Encounters:  08/18/23 180 lb (81.6 kg)  07/24/23 185 lb (83.9 kg)  01/21/23 184 lb 6.4 oz (83.6 kg)     GEN: Well nourished, well developed in no acute distress NECK: No JVD; No carotid bruits CARDIAC: {EPRHYTHM:28826}, no murmurs, rubs, gallops RESPIRATORY:  Clear to auscultation without rales, wheezing or rhonchi  ABDOMEN: Soft, non-tender, non-distended EXTREMITIES:  No edema; No deformity   ASSESSMENT AND PLAN:    #SVT: Terminated with 6 mg of IV adenosine. EKG most suggestive of AVNRT.  -Continue carvedilol.   #Hypertension *** goal today.  Recommend checking blood pressures 1-2 times per week at home and recording the values.  Recommend bringing these recordings  to the primary care physician.   Follow up with {ONGEX:52841} {EPFOLLOW LK:44010}  Signed, Nobie Putnam, MD

## 2023-09-17 ENCOUNTER — Ambulatory Visit: Payer: No Typology Code available for payment source | Attending: Cardiology | Admitting: Cardiology

## 2023-11-07 ENCOUNTER — Other Ambulatory Visit: Payer: Self-pay | Admitting: Primary Care

## 2023-11-07 DIAGNOSIS — E1165 Type 2 diabetes mellitus with hyperglycemia: Secondary | ICD-10-CM

## 2023-11-24 ENCOUNTER — Other Ambulatory Visit: Payer: Self-pay | Admitting: Primary Care

## 2023-11-24 DIAGNOSIS — F411 Generalized anxiety disorder: Secondary | ICD-10-CM

## 2023-12-13 ENCOUNTER — Other Ambulatory Visit: Payer: Self-pay | Admitting: Primary Care

## 2023-12-13 DIAGNOSIS — I10 Essential (primary) hypertension: Secondary | ICD-10-CM

## 2023-12-24 ENCOUNTER — Other Ambulatory Visit: Payer: Self-pay | Admitting: Primary Care

## 2023-12-24 DIAGNOSIS — G4700 Insomnia, unspecified: Secondary | ICD-10-CM

## 2024-01-21 ENCOUNTER — Encounter: Payer: Self-pay | Admitting: Primary Care

## 2024-01-21 ENCOUNTER — Other Ambulatory Visit: Payer: Self-pay | Admitting: Primary Care

## 2024-01-21 ENCOUNTER — Ambulatory Visit (INDEPENDENT_AMBULATORY_CARE_PROVIDER_SITE_OTHER): Payer: No Typology Code available for payment source | Admitting: Primary Care

## 2024-01-21 ENCOUNTER — Ambulatory Visit: Payer: Self-pay | Admitting: Primary Care

## 2024-01-21 VITALS — BP 102/78 | HR 89 | Temp 98.3°F | Ht 69.0 in | Wt 177.0 lb

## 2024-01-21 DIAGNOSIS — Z0001 Encounter for general adult medical examination with abnormal findings: Secondary | ICD-10-CM

## 2024-01-21 DIAGNOSIS — F419 Anxiety disorder, unspecified: Secondary | ICD-10-CM

## 2024-01-21 DIAGNOSIS — I10 Essential (primary) hypertension: Secondary | ICD-10-CM | POA: Diagnosis not present

## 2024-01-21 DIAGNOSIS — Z0000 Encounter for general adult medical examination without abnormal findings: Secondary | ICD-10-CM

## 2024-01-21 DIAGNOSIS — Z720 Tobacco use: Secondary | ICD-10-CM | POA: Diagnosis not present

## 2024-01-21 DIAGNOSIS — E876 Hypokalemia: Secondary | ICD-10-CM

## 2024-01-21 DIAGNOSIS — F32A Depression, unspecified: Secondary | ICD-10-CM

## 2024-01-21 DIAGNOSIS — E1165 Type 2 diabetes mellitus with hyperglycemia: Secondary | ICD-10-CM | POA: Diagnosis not present

## 2024-01-21 DIAGNOSIS — Z1211 Encounter for screening for malignant neoplasm of colon: Secondary | ICD-10-CM

## 2024-01-21 DIAGNOSIS — M5441 Lumbago with sciatica, right side: Secondary | ICD-10-CM

## 2024-01-21 DIAGNOSIS — Z7985 Long-term (current) use of injectable non-insulin antidiabetic drugs: Secondary | ICD-10-CM

## 2024-01-21 DIAGNOSIS — G8929 Other chronic pain: Secondary | ICD-10-CM | POA: Diagnosis not present

## 2024-01-21 DIAGNOSIS — F5102 Adjustment insomnia: Secondary | ICD-10-CM

## 2024-01-21 LAB — LIPID PANEL
Cholesterol: 142 mg/dL (ref 0–200)
HDL: 75.5 mg/dL (ref >39.00)
LDL Cholesterol: 51 mg/dL (ref 0–99)
NonHDL: 66.45
Total CHOL/HDL Ratio: 2
Triglycerides: 79 mg/dL (ref 0.0–149.0)
VLDL: 15.8 mg/dL (ref 0.0–40.0)

## 2024-01-21 LAB — HEMOGLOBIN A1C: Hgb A1c MFr Bld: 6.3 % (ref 4.6–6.5)

## 2024-01-21 LAB — MICROALBUMIN / CREATININE URINE RATIO
Creatinine,U: 167.9 mg/dL
Microalb Creat Ratio: 9.2 mg/g (ref 0.0–30.0)
Microalb, Ur: 1.6 mg/dL (ref 0.0–1.9)

## 2024-01-21 LAB — COMPREHENSIVE METABOLIC PANEL WITH GFR
ALT: 38 U/L — ABNORMAL HIGH (ref 0–35)
AST: 29 U/L (ref 0–37)
Albumin: 4.4 g/dL (ref 3.5–5.2)
Alkaline Phosphatase: 35 U/L — ABNORMAL LOW (ref 39–117)
BUN: 19 mg/dL (ref 6–23)
CO2: 33 meq/L — ABNORMAL HIGH (ref 19–32)
Calcium: 9.7 mg/dL (ref 8.4–10.5)
Chloride: 100 meq/L (ref 96–112)
Creatinine, Ser: 0.86 mg/dL (ref 0.40–1.20)
GFR: 75.28 mL/min (ref >60.00)
Glucose, Bld: 134 mg/dL — ABNORMAL HIGH (ref 70–99)
Potassium: 3.3 meq/L — ABNORMAL LOW (ref 3.5–5.1)
Sodium: 141 meq/L (ref 135–145)
Total Bilirubin: 0.4 mg/dL (ref 0.2–1.2)
Total Protein: 6.9 g/dL (ref 6.0–8.3)

## 2024-01-21 MED ORDER — POTASSIUM CHLORIDE CRYS ER 20 MEQ PO TBCR: 20.0000 meq | EXTENDED_RELEASE_TABLET | Freq: Every day | ORAL | 0 refills | Status: DC

## 2024-01-21 MED ORDER — OZEMPIC (2 MG/DOSE) 8 MG/3ML ~~LOC~~ SOPN: 2.0000 mg | PEN_INJECTOR | SUBCUTANEOUS | 1 refills | Status: DC

## 2024-01-21 MED ORDER — CYCLOBENZAPRINE HCL 10 MG PO TABS: 10.0000 mg | ORAL_TABLET | Freq: Three times a day (TID) | ORAL | 0 refills | Status: AC | PRN

## 2024-01-21 MED ORDER — VARENICLINE TARTRATE (STARTER) 0.5 MG X 11 & 1 MG X 42 PO TBPK: ORAL_TABLET | ORAL | 0 refills | Status: AC

## 2024-01-21 NOTE — Assessment & Plan Note (Signed)
 She has been off of potassium for several weeks.  Potassium level ordered and pending.

## 2024-01-21 NOTE — Assessment & Plan Note (Signed)
 No alarm signs on exam today.  Will increase cyclobenzaprine  dose to 10 mg as needed.  Drowsiness precautions provided. Referral placed to physical therapy.

## 2024-01-21 NOTE — Progress Notes (Signed)
 Subjective:    Patient ID: Michelle Marshall, female    DOB: 05/29/1966, 58 y.o.   MRN: [id number]  HPI  Michelle Marshall is a very pleasant 58 y.o. female who presents today for complete physical and follow up of chronic conditions.  She is also requesting a refill of her cyclobenzaprine  and is asking for a dose increase. Over the last 3- 4 months she's noticed increased back pain and sciatic flares. Her pain is chronic to the right lower back with radiation down her right lower extremity with radiation to her foot at times. Her flares last for several hours, takes Ibuprofen  with temporary improvement. She sits most of her work day.  She denies paresthesias, loss of bowel/bladder control.  She has not completed physical therapy previously.  She would also like to stop smoking again.  She currently smokes cigarettes, has smoked intermittently for the last 10 years.  Previously managed on Chantix  and was able to quit.  She would like to try Chantix  again.  Immunizations: -Tetanus: Completed in 2021 -Shingles: Completed Shingrix  series  -Pneumonia: Completed 2018  Diet: Fair diet.  Exercise: Regular exercise.  Eye exam: Completes annually  Dental exam: Completes semi-annually    Pap Smear: Completed in May 2023, follows with GYN. Mammogram: Completed years ago, she declines today but will schedule with GYN  Colonoscopy: Never completed.   BP Readings from Last 3 Encounters:  01/21/24 102/78  08/19/23 (!) 144/95  07/24/23 (!) 158/94   Wt Readings from Last 3 Encounters:  01/21/24 177 lb (80.3 kg)  08/18/23 180 lb (81.6 kg)  07/24/23 185 lb (83.9 kg)     Review of Systems  Constitutional:  Negative for unexpected weight change.  HENT:  Negative for rhinorrhea.   Respiratory:  Negative for cough and shortness of breath.   Cardiovascular:  Negative for chest pain.  Gastrointestinal:  Negative for constipation and diarrhea.  Genitourinary:  Negative for difficulty  urinating.  Musculoskeletal:  Positive for arthralgias and back pain. Negative for myalgias.  Skin:  Negative for rash.  Allergic/Immunologic: Negative for environmental allergies.  Neurological:  Negative for dizziness, numbness and headaches.  Psychiatric/Behavioral:  The patient is not nervous/anxious.          Past Medical History:  Diagnosis Date   Abdominal mass 04/06/2020   Abnormal uterine bleeding 08/21/2015   May 2018-spotting associated mild cramping   Acute pain of left knee 10/15/2018   Acute pain of right foot 08/31/2020   Amenorrhea 01/25/2016   Anemia    Arthritis    Complication of anesthesia 2002   DURING SCAR TISSUE EXCISION FROM FALLOPIAN TUBES(2002), PT WAS GIVEN GENERAL ANESTHESIA AND PT BEGAN HAVING A REACTION TO THE ANESTHESIA AND LIPS AND TONGUE STARTED SWELLING AND THE SURGERY HAD TO BE STOPPED DUE TO AIRWAY CLOSING UP-PT HAD GA PRIOR TO THIS FOR HER GALLBLADDER WITH NO PROBLEM PREVIOUSLY   Depression    Diabetes mellitus without complication (HCC)    Dysrhythmia    H/O TACHYCARDIA. stress and anxiety brings it on   Family history of adverse reaction to anesthesia    BROTHER-HARD TIME WAKING UP   Grief 04/23/2018   Recent loss of husband August 2019-brain aneurysm   Hypertension    Irregular periods    Postop check-status post hysteroscopy/D&C 10/09/2017   Status post endometrial ablation 01/25/2016   Vitamin D deficiency    per patient, no longer a problem    Social History   Socioeconomic History   Marital status:  Widowed    Spouse name: Not on file   Number of children: 1   Years of education: Not on file   Highest education level: Not on file  Occupational History   Not on file  Tobacco Use   Smoking status: Some Days    Current packs/day: 0.00    Average packs/day: 0.2 packs/day for 20.0 years (3.0 ttl pk-yrs)    Types: Cigarettes    Start date: 09/05/1995    Last attempt to quit: 09/05/2015    Years since quitting: 8.3   Smokeless  tobacco: Never  Vaping Use   Vaping status: Never Used  Substance and Sexual Activity   Alcohol use: Not Currently    Alcohol/week: 1.0 standard drink of alcohol    Types: 1 Glasses of wine per week    Comment: occasional glass of wine   Drug use: No   Sexual activity: Yes    Birth control/protection: None, Post-menopausal    Comment: ablation  Other Topics Concern   Not on file  Social History Narrative   Married.   1 child.   She works as a Chartered loss adjuster   Enjoys shopping, traveling.    Social Drivers of Corporate investment banker Strain: Not on file  Food Insecurity: Not on file  Transportation Needs: Not on file  Physical Activity: Inactive (05/15/2018)   Exercise Vital Sign    Days of Exercise per Week: 0 days    Minutes of Exercise per Session: 0 min  Stress: Not on file  Social Connections: Not on file  Intimate Partner Violence: Not on file    Past Surgical History:  Procedure Laterality Date   CHOLECYSTECTOMY     DILATATION & CURETTAGE/HYSTEROSCOPY WITH MYOSURE N/A 09/30/2017   Procedure: DILATATION & CURETTAGE/HYSTEROSCOPY;  Surgeon: Kathe Gladis LABOR, MD;  Location: [facility];  Service: Gynecology;  Laterality: N/A;   DILITATION & CURRETTAGE/HYSTROSCOPY WITH NOVASURE ABLATION N/A 09/19/2015   Procedure: DILATATION & CURETTAGE/HYSTEROSCOPY WITH NOVASURE ABLATION;  Surgeon: Gladis LABOR Kathe, MD;  Location: [facility];  Service: Gynecology;  Laterality: N/A;   INSERTION OF MESH  06/08/2021   Procedure: INSERTION OF MESH;  Surgeon: Jordis Laneta FALCON, MD;  Location: [facility];  Service: General;;   JOINT REPLACEMENT Left    30 years ago. knee   KNEE ARTHROSCOPY Left    VENTRAL HERNIA REPAIR N/A 06/08/2021   Procedure: HERNIA REPAIR VENTRAL ADULT, incisional;  Surgeon: Jordis Laneta FALCON, MD;  Location: [facility];  Service: General;  Laterality: N/A;    Family History  Problem Relation Age of Onset   Cancer Father        throat   Hypertension Mother    Diabetes  Paternal Grandmother    Breast cancer Neg Hx     Allergies  Allergen Reactions   Lisinopril Swelling    Lips and face   Losartan  Other (See Comments)    Mouth swollen, rash and itching   Wellbutrin [Bupropion] Hives and Swelling    Swelling of mouth   Amlodipine  Swelling   Percocet [Oxycodone -Acetaminophen ] Anxiety    Current Outpatient Medications on File Prior to Visit  Medication Sig Dispense Refill   amLODipine  (NORVASC ) 5 MG tablet TAKE 1 TABLET BY MOUTH EVERY DAY FOR BLOOD PRESSURE 90 tablet 0   busPIRone  (BUSPAR ) 5 MG tablet TAKE 1 TABLET BY MOUTH 2 TIMES DAILY. FOR ANXIETY 180 tablet 0   carvedilol  (COREG ) 25 MG tablet Take 1 tablet (25 mg total) by mouth 2 (two)  times daily with a meal. TAKE 1 TABLET(12.5 MG) BY MOUTH TWICE DAILY WITH A MEAL for heart rate 60 tablet 1   EPINEPHrine  0.3 mg/0.3 mL IJ SOAJ injection Inject 0.3 mg into the muscle as needed for anaphylaxis.     glipiZIDE  (GLUCOTROL  XL) 10 MG 24 hr tablet Take 1 tablet (10 mg total) by mouth daily with breakfast. For diabetes. Office visit required for further refills. 30 tablet 0   hydrochlorothiazide  (HYDRODIURIL ) 25 MG tablet Take 1 tablet (25 mg total) by mouth daily. 90 tablet 2   ibuprofen  (ADVIL ) 800 MG tablet Take 1 tablet (800 mg total) by mouth every 8 (eight) hours as needed for moderate pain. 30 tablet 0   Multiple Vitamin (MULTIVITAMIN) tablet Take 1 tablet by mouth daily.     potassium chloride  SA (KLOR-CON  M20) 20 MEQ tablet TAKE 1 TABLET (20 MEQ TOTAL) BY MOUTH DAILY. FOR LOW POTASSIUM. 90 tablet 2   sertraline  (ZOLOFT ) 100 MG tablet TAKE 1 TABLET(100 MG) BY MOUTH DAILY FOR ANXIETY. TAKE WITH 25 MG DOSE. 90 tablet 0   traZODone  (DESYREL ) 150 MG tablet TAKE 1 TABLET BY MOUTH EVERY DAY AT BEDTIME FOR SLEEP 90 tablet 0   estradiol -norethindrone  (MIMVEY) 1-0.5 MG tablet Take 1 tablet by mouth daily. (Patient not taking: Reported on 01/21/2024) 90 tablet 3   valACYclovir  (VALTREX ) 1000 MG tablet Take 2  tablets (2,000 mg total) by mouth 2 (two) times daily. For one day (Patient not taking: Reported on 01/21/2024) 4 tablet 0   No current facility-administered medications on file prior to visit.    BP 102/78   Pulse 89   Temp 98.3 F (36.8 C) (Temporal)   Ht 5' 9 (1.753 m)   Wt 177 lb (80.3 kg)   SpO2 95%   BMI 26.14 kg/m  Objective:   Physical Exam HENT:     Right Ear: Tympanic membrane and ear canal normal.     Left Ear: Tympanic membrane and ear canal normal.  Eyes:     Pupils: Pupils are equal, round, and reactive to light.  Cardiovascular:     Rate and Rhythm: Normal rate and regular rhythm.  Pulmonary:     Effort: Pulmonary effort is normal.     Breath sounds: Normal breath sounds.  Abdominal:     General: Bowel sounds are normal.     Palpations: Abdomen is soft.     Tenderness: There is no abdominal tenderness.  Musculoskeletal:        General: Normal range of motion.     Cervical back: Neck supple.  Skin:    General: Skin is warm and dry.  Neurological:     Mental Status: She is alert and oriented to person, place, and time.     Cranial Nerves: No cranial nerve deficit.     Deep Tendon Reflexes:     Reflex Scores:      Patellar reflexes are 2+ on the right side and 2+ on the left side. Psychiatric:        Mood and Affect: Mood normal.           Assessment & Plan:  Encounter for annual general medical examination with abnormal findings in adult Assessment & Plan: Immunizations UTD. Pap smear due, she will follow-up with GYN. Mammogram due, she declines now but will follow-up with GYN. Colonoscopy overdue, she declines colonoscopy but will agree to Cologuard.  Orders placed.  Discussed the importance of a healthy diet and regular exercise in order for  weight loss, and to reduce the risk of further co-morbidity.  Exam stable. Labs pending.  Follow up in 1 year for repeat physical.    Type 2 diabetes mellitus with hyperglycemia, without long-term  current use of insulin (HCC) Assessment & Plan: Repeat A1c pending. Urine micro albumin due and pending.  Continue glipizide  XL 10 mg daily, Ozempic  2 mg weekly. Consider dose reduction/discontinuation of glipizide  if warranted.  Follow-up in 6 months.  Orders: -     Ozempic  (2 MG/DOSE); Inject 2 mg into the skin once a week. for diabetes.  Dispense: 6 mL; Refill: 1 -     Hemoglobin A1c -     Microalbumin / creatinine urine ratio  Primary hypertension Assessment & Plan: Controlled.  Continue amlodipine  5 mg daily, carvedilol  25 mg twice daily, HCTZ 25 mg daily. Consider dose reduction of HCTZ to 12.5 mg daily given readings today.  She will monitor blood pressure and notify if readings remain lower.   CMP pending.  Orders: -     Comprehensive metabolic panel with GFR -     Lipid panel  Anxiety and depression Assessment & Plan: Controlled.  Continue sertraline  100 mg daily, BuSpar  5 mg twice daily   Chronic right-sided low back pain with right-sided sciatica Assessment & Plan: No alarm signs on exam today.  Will increase cyclobenzaprine  dose to 10 mg as needed.  Drowsiness precautions provided. Referral placed to physical therapy.  Orders: -     Cyclobenzaprine  HCl; Take 1 tablet (10 mg total) by mouth 3 (three) times daily as needed for muscle spasms.  Dispense: 30 tablet; Refill: 0 -     Ambulatory referral to Physical Therapy  Hypokalemia Assessment & Plan: She has been off of potassium for several weeks.  Potassium level ordered and pending.   Adjustment insomnia Assessment & Plan: Controlled.  Continue trazodone  150 mg at bedtime.   Tobacco abuse Assessment & Plan: Discussed options for treatment. She has done well on Chantix  previously so we will resume.  She agrees.  Prescription for Chantix  starter pack sent to pharmacy. She will update towards the end of this prescription so that we may send the continuing pack to her pharmacy.  She does  not seem to qualify for lung cancer screening program at this time.  Smoker of 10 years inconsistently.  Orders: -     Varenicline  Tartrate (Starter); Take one 0.5 mg tablet by mouth once daily for 3 days, then increase to one 0.5 mg tablet twice daily for 4 days, then increase to one 1 mg tablet twice daily.  Dispense: 53 each; Refill: 0  Screening for colon cancer -     Cologuard        Comer MARLA Gaskins, NP

## 2024-01-21 NOTE — Assessment & Plan Note (Signed)
 Controlled.  Continue sertraline  100 mg daily, BuSpar  5 mg twice daily

## 2024-01-21 NOTE — Assessment & Plan Note (Signed)
Controlled.  Continue trazodone 150 mg at bedtime.

## 2024-01-21 NOTE — Patient Instructions (Addendum)
 Stop by the lab prior to leaving today. I will notify you of your results once received.   You may take the cyclobenzaprine  10 mg tablet up to 3 times daily if needed.  Be careful as this may cause drowsiness.  You will receive a phone call regarding a physical therapy referral.  Please follow-up with GYN to schedule your Pap smear mammogram.  Start Chantix  starter pack for smoking.  Follow the package instructions.  Keep me updated.  Please schedule a follow up visit for 6 months for a diabetes check.  It was a pleasure to see you today!

## 2024-01-21 NOTE — Assessment & Plan Note (Signed)
 Immunizations UTD. Pap smear due, she will follow-up with GYN. Mammogram due, she declines now but will follow-up with GYN. Colonoscopy overdue, she declines colonoscopy but will agree to Cologuard.  Orders placed.  Discussed the importance of a healthy diet and regular exercise in order for weight loss, and to reduce the risk of further co-morbidity.  Exam stable. Labs pending.  Follow up in 1 year for repeat physical.

## 2024-01-21 NOTE — Assessment & Plan Note (Signed)
 Repeat A1c pending. Urine micro albumin due and pending.  Continue glipizide  XL 10 mg daily, Ozempic  2 mg weekly. Consider dose reduction/discontinuation of glipizide  if warranted.  Follow-up in 6 months.

## 2024-01-21 NOTE — Assessment & Plan Note (Signed)
 Controlled.  Continue amlodipine  5 mg daily, carvedilol  25 mg twice daily, HCTZ 25 mg daily. Consider dose reduction of HCTZ to 12.5 mg daily given readings today.  She will monitor blood pressure and notify if readings remain lower.   CMP pending.

## 2024-01-21 NOTE — Assessment & Plan Note (Signed)
 Discussed options for treatment. She has done well on Chantix  previously so we will resume.  She agrees.  Prescription for Chantix  starter pack sent to pharmacy. She will update towards the end of this prescription so that we may send the continuing pack to her pharmacy.  She does not seem to qualify for lung cancer screening program at this time.  Smoker of 10 years inconsistently.

## 2024-02-28 ENCOUNTER — Ambulatory Visit: Payer: Self-pay | Admitting: Nurse Practitioner

## 2024-02-29 ENCOUNTER — Other Ambulatory Visit: Payer: Self-pay | Admitting: Primary Care

## 2024-02-29 DIAGNOSIS — F411 Generalized anxiety disorder: Secondary | ICD-10-CM

## 2024-03-12 ENCOUNTER — Ambulatory Visit: Payer: Self-pay | Admitting: Nurse Practitioner

## 2024-03-18 ENCOUNTER — Other Ambulatory Visit: Payer: Self-pay | Admitting: Primary Care

## 2024-03-18 DIAGNOSIS — I10 Essential (primary) hypertension: Secondary | ICD-10-CM

## 2024-03-22 ENCOUNTER — Other Ambulatory Visit: Payer: Self-pay | Admitting: Primary Care

## 2024-03-22 DIAGNOSIS — G4700 Insomnia, unspecified: Secondary | ICD-10-CM

## 2024-04-21 ENCOUNTER — Other Ambulatory Visit: Payer: Self-pay | Admitting: Primary Care

## 2024-04-21 DIAGNOSIS — E876 Hypokalemia: Secondary | ICD-10-CM

## 2024-05-17 ENCOUNTER — Other Ambulatory Visit: Payer: Self-pay | Admitting: Primary Care

## 2024-05-17 DIAGNOSIS — E1165 Type 2 diabetes mellitus with hyperglycemia: Secondary | ICD-10-CM

## 2024-06-12 ENCOUNTER — Encounter: Payer: Self-pay | Admitting: Surgery

## 2024-06-14 ENCOUNTER — Other Ambulatory Visit: Payer: Self-pay | Admitting: Primary Care

## 2024-06-14 DIAGNOSIS — E1165 Type 2 diabetes mellitus with hyperglycemia: Secondary | ICD-10-CM

## 2024-07-22 ENCOUNTER — Ambulatory Visit: Admitting: Primary Care

## 2024-07-23 ENCOUNTER — Ambulatory Visit: Admitting: Primary Care

## 2024-08-01 ENCOUNTER — Telehealth: Admitting: Nurse Practitioner

## 2024-08-01 DIAGNOSIS — J069 Acute upper respiratory infection, unspecified: Secondary | ICD-10-CM | POA: Diagnosis not present

## 2024-08-01 MED ORDER — IBUPROFEN 800 MG PO TABS: 800.0000 mg | ORAL_TABLET | Freq: Three times a day (TID) | ORAL | 0 refills | Status: AC | PRN

## 2024-08-01 MED ORDER — PROMETHAZINE-DM 6.25-15 MG/5ML PO SYRP: 5.0000 mL | ORAL_SOLUTION | Freq: Four times a day (QID) | ORAL | 0 refills | Status: AC | PRN

## 2024-08-01 MED ORDER — PREDNISONE 20 MG PO TABS: 20.0000 mg | ORAL_TABLET | Freq: Every day | ORAL | 0 refills | Status: AC

## 2024-08-01 MED ORDER — IPRATROPIUM BROMIDE 0.03 % NA SOLN: 2.0000 | Freq: Two times a day (BID) | NASAL | 12 refills | Status: AC

## 2024-08-01 NOTE — Progress Notes (Signed)
 " Virtual Visit Consent   Michelle Marshall, you are scheduled for a virtual visit with a [facility] provider today. Just as with appointments in the office, your consent must be obtained to participate. Your consent will be active for this visit and any virtual visit you may have with one of our providers in the next 365 days. If you have a MyChart account, a copy of this consent can be sent to you electronically.  As this is a virtual visit, video technology does not allow for your provider to perform a traditional examination. This may limit your provider's ability to fully assess your condition. If your provider identifies any concerns that need to be evaluated in person or the need to arrange testing (such as labs, EKG, etc.), we will make arrangements to do so. Although advances in technology are sophisticated, we cannot ensure that it will always work on either your end or our end. If the connection with a video visit is poor, the visit may have to be switched to a telephone visit. With either a video or telephone visit, we are not always able to ensure that we have a secure connection.  By engaging in this virtual visit, you consent to the provision of healthcare and authorize for your insurance to be billed (if applicable) for the services provided during this visit. Depending on your insurance coverage, you may receive a charge related to this service.  I need to obtain your verbal consent now. Are you willing to proceed with your visit today? Michelle Marshall has provided verbal consent on 08/01/2024 for a virtual visit (video or telephone). Michelle LELON Servant, NP  Date: 08/01/2024 5:29 PM   Virtual Visit via Video Note   I, Michelle Marshall, connected with  Michelle Marshall  ([id number], 03/18/1966) on 08/01/24 at  5:15 PM EST by a video-enabled telemedicine application and verified that I am speaking with the correct person using two identifiers.  Location: Patient: Virtual Visit  Location Patient: Home Provider: Virtual Visit Location Provider: Home Office   I discussed the limitations of evaluation and management by telemedicine and the availability of in person appointments. The patient expressed understanding and agreed to proceed.    History of Present Illness: Michelle Marshall is a 59 y.o. who identifies as a female who was assigned female at birth, and is being seen today for viral URI.  Over the past 2 days Michelle Marshall has been experiencing severe nasal congestion, post nasal drainage, cough, right ear ache, right-sided cervical lymphadenopathy. Denies fever. Has not taken a COVID/FLU test. Currently taking OTC zinc vand vitamin c with on relief.   Problems:  Patient Active Problem List   Diagnosis Date Noted   SVT (supraventricular tachycardia) 08/19/2023   Elevated troponin I level 08/19/2023   Anxiety and depression 08/19/2023   Troponin level elevated 08/19/2023   Cold sore 07/24/2023   Encounter for annual general medical examination with abnormal findings in adult 01/17/2022   Hypokalemia 05/17/2021   Umbilical hernia without obstruction and without gangrene 05/17/2021   Chronic right-sided low back pain with right-sided sciatica 05/17/2021   Tobacco abuse 01/05/2021   Allergic reaction 11/22/2020   Seasonal allergies 10/10/2018   Abnormal pelvic ultrasound 09/12/2017   Cervical stenosis (uterine cervix) 09/12/2017   GAD (generalized anxiety disorder) 05/04/2016   Insomnia 10/13/2015   Submucous leiomyoma of uterus 08/21/2015   Type 2 diabetes mellitus with hyperglycemia (HCC) 08/05/2015   Hypertension 08/05/2015   Obesity 08/05/2015   H/O: iron  deficiency anemia 08/05/2015    Allergies: Allergies[1] Medications: Current Medications[2]  Observations/Objective: Patient is well-developed, well-nourished in no acute distress.  Resting comfortably at home.  Head is normocephalic, atraumatic.  No labored breathing.  Speech is clear and  coherent with logical content.  Patient is alert and oriented at baseline.    Assessment and Plan: 1. Viral URI with cough (Primary) - predniSONE  (DELTASONE ) 20 MG tablet; Take 1 tablet (20 mg total) by mouth daily with breakfast for 5 days.  Dispense: 5 tablet; Refill: 0 - promethazine -dextromethorphan (PROMETHAZINE -DM) 6.25-15 MG/5ML syrup; Take 5 mLs by mouth 4 (four) times daily as needed.  Dispense: 240 mL; Refill: 0 - ipratropium (ATROVENT ) 0.03 % nasal spray; Place 2 sprays into both nostrils every 12 (twelve) hours.  Dispense: 30 mL; Refill: 12 - ibuprofen  (ADVIL ) 800 MG tablet; Take 1 tablet (800 mg total) by mouth every 8 (eight) hours as needed for moderate pain (pain score 4-6).  Dispense: 30 tablet; Refill: 0  INSTRUCTIONS: use a humidifier for nasal congestion Drink plenty of fluids, rest and wash hands frequently to avoid the spread of infection Alternate tylenol  and Motrin  for relief of fever   Follow Up Instructions: I discussed the assessment and treatment plan with the patient. The patient was provided an opportunity to ask questions and all were answered. The patient agreed with the plan and demonstrated an understanding of the instructions.  A copy of instructions were sent to the patient via MyChart unless otherwise noted below.     The patient was advised to call back or seek an in-person evaluation if the symptoms worsen or if the condition fails to improve as anticipated.    Michelle Marshall W Michelle Hardt, NP     [1]  Allergies Allergen Reactions   Lisinopril Swelling    Lips and face   Losartan  Other (See Comments)    Mouth swollen, rash and itching   Wellbutrin [Bupropion] Hives and Swelling    Swelling of mouth   Amlodipine  Swelling   Percocet [Oxycodone -Acetaminophen ] Anxiety  [2]  Current Outpatient Medications:    ipratropium (ATROVENT ) 0.03 % nasal spray, Place 2 sprays into both nostrils every 12 (twelve) hours., Disp: 30 mL, Rfl: 12   predniSONE  (DELTASONE )  20 MG tablet, Take 1 tablet (20 mg total) by mouth daily with breakfast for 5 days., Disp: 5 tablet, Rfl: 0   promethazine -dextromethorphan (PROMETHAZINE -DM) 6.25-15 MG/5ML syrup, Take 5 mLs by mouth 4 (four) times daily as needed., Disp: 240 mL, Rfl: 0   amLODipine  (NORVASC ) 5 MG tablet, TAKE 1 TABLET BY MOUTH EVERY DAY FOR BLOOD PRESSURE, Disp: 90 tablet, Rfl: 2   busPIRone  (BUSPAR ) 5 MG tablet, TAKE 1 TABLET BY MOUTH 2 TIMES DAILY. FOR ANXIETY, Disp: 180 tablet, Rfl: 2   carvedilol  (COREG ) 25 MG tablet, Take 1 tablet (25 mg total) by mouth 2 (two) times daily with a meal. TAKE 1 TABLET(12.5 MG) BY MOUTH TWICE DAILY WITH A MEAL for heart rate, Disp: 60 tablet, Rfl: 1   cyclobenzaprine  (FLEXERIL ) 10 MG tablet, Take 1 tablet (10 mg total) by mouth 3 (three) times daily as needed for muscle spasms., Disp: 30 tablet, Rfl: 0   EPINEPHrine  0.3 mg/0.3 mL IJ SOAJ injection, Inject 0.3 mg into the muscle as needed for anaphylaxis., Disp: , Rfl:    estradiol -norethindrone  (MIMVEY) 1-0.5 MG tablet, Take 1 tablet by mouth daily. (Patient not taking: Reported on 01/21/2024), Disp: 90 tablet, Rfl: 3   glipiZIDE  (GLUCOTROL  XL) 10 MG 24 hr tablet, Take  1 tablet (10 mg total) by mouth daily with breakfast. For diabetes. Office visit required for further refills., Disp: 30 tablet, Rfl: 0   hydrochlorothiazide  (HYDRODIURIL ) 25 MG tablet, Take 1 tablet (25 mg total) by mouth daily., Disp: 90 tablet, Rfl: 2   ibuprofen  (ADVIL ) 800 MG tablet, Take 1 tablet (800 mg total) by mouth every 8 (eight) hours as needed for moderate pain (pain score 4-6)., Disp: 30 tablet, Rfl: 0   Multiple Vitamin (MULTIVITAMIN) tablet, Take 1 tablet by mouth daily., Disp: , Rfl:    potassium chloride  SA (KLOR-CON  M) 20 MEQ tablet, TAKE 1 TABLET BY MOUTH EVERY DAY, Disp: 90 tablet, Rfl: 0   Semaglutide , 2 MG/DOSE, (OZEMPIC , 2 MG/DOSE,) 8 MG/3ML SOPN, INJECT 2 MG INTO THE SKIN ONCE A WEEK. FOR DIABETES., Disp: 9 mL, Rfl: 0   sertraline  (ZOLOFT ) 100  MG tablet, TAKE 1 TABLET(100 MG) BY MOUTH DAILY FOR ANXIETY. TAKE WITH 25 MG DOSE., Disp: 90 tablet, Rfl: 2   traZODone  (DESYREL ) 150 MG tablet, TAKE 1 TABLET BY MOUTH EVERY DAY AT BEDTIME FOR SLEEP, Disp: 90 tablet, Rfl: 2   valACYclovir  (VALTREX ) 1000 MG tablet, Take 2 tablets (2,000 mg total) by mouth 2 (two) times daily. For one day (Patient not taking: Reported on 01/21/2024), Disp: 4 tablet, Rfl: 0   Varenicline  Tartrate, Starter, (CHANTIX  STARTING MONTH PAK) 0.5 MG X 11 & 1 MG X 42 TBPK, Take one 0.5 mg tablet by mouth once daily for 3 days, then increase to one 0.5 mg tablet twice daily for 4 days, then increase to one 1 mg tablet twice daily., Disp: 53 each, Rfl: 0  "

## 2024-08-01 NOTE — Patient Instructions (Signed)
 " Michelle Marshall, thank you for joining Haze LELON Servant, NP for today's virtual visit.  While this provider is not your primary care provider (PCP), if your PCP is located in our provider database this encounter information will be shared with them immediately following your visit.   A [facility] MyChart account gives you access to today's visit and all your visits, tests, and labs performed at [facility]  click here if you don't have a [facility] MyChart account or go to [email or web address]  Consent: (Patient) Michelle Marshall provided verbal consent for this virtual visit at the beginning of the encounter.  Current Medications:  Current Outpatient Medications:    ipratropium (ATROVENT ) 0.03 % nasal spray, Place 2 sprays into both nostrils every 12 (twelve) hours., Disp: 30 mL, Rfl: 12   predniSONE  (DELTASONE ) 20 MG tablet, Take 1 tablet (20 mg total) by mouth daily with breakfast for 5 days., Disp: 5 tablet, Rfl: 0   promethazine -dextromethorphan (PROMETHAZINE -DM) 6.25-15 MG/5ML syrup, Take 5 mLs by mouth 4 (four) times daily as needed., Disp: 240 mL, Rfl: 0   amLODipine  (NORVASC ) 5 MG tablet, TAKE 1 TABLET BY MOUTH EVERY DAY FOR BLOOD PRESSURE, Disp: 90 tablet, Rfl: 2   busPIRone  (BUSPAR ) 5 MG tablet, TAKE 1 TABLET BY MOUTH 2 TIMES DAILY. FOR ANXIETY, Disp: 180 tablet, Rfl: 2   carvedilol  (COREG ) 25 MG tablet, Take 1 tablet (25 mg total) by mouth 2 (two) times daily with a meal. TAKE 1 TABLET(12.5 MG) BY MOUTH TWICE DAILY WITH A MEAL for heart rate, Disp: 60 tablet, Rfl: 1   cyclobenzaprine  (FLEXERIL ) 10 MG tablet, Take 1 tablet (10 mg total) by mouth 3 (three) times daily as needed for muscle spasms., Disp: 30 tablet, Rfl: 0   EPINEPHrine  0.3 mg/0.3 mL IJ SOAJ injection, Inject 0.3 mg into the muscle as needed for anaphylaxis., Disp: , Rfl:    estradiol -norethindrone  (MIMVEY) 1-0.5 MG tablet, Take 1 tablet by mouth daily. (Patient not taking: Reported on  01/21/2024), Disp: 90 tablet, Rfl: 3   glipiZIDE  (GLUCOTROL  XL) 10 MG 24 hr tablet, Take 1 tablet (10 mg total) by mouth daily with breakfast. For diabetes. Office visit required for further refills., Disp: 30 tablet, Rfl: 0   hydrochlorothiazide  (HYDRODIURIL ) 25 MG tablet, Take 1 tablet (25 mg total) by mouth daily., Disp: 90 tablet, Rfl: 2   ibuprofen  (ADVIL ) 800 MG tablet, Take 1 tablet (800 mg total) by mouth every 8 (eight) hours as needed for moderate pain (pain score 4-6)., Disp: 30 tablet, Rfl: 0   Multiple Vitamin (MULTIVITAMIN) tablet, Take 1 tablet by mouth daily., Disp: , Rfl:    potassium chloride  SA (KLOR-CON  M) 20 MEQ tablet, TAKE 1 TABLET BY MOUTH EVERY DAY, Disp: 90 tablet, Rfl: 0   Semaglutide , 2 MG/DOSE, (OZEMPIC , 2 MG/DOSE,) 8 MG/3ML SOPN, INJECT 2 MG INTO THE SKIN ONCE A WEEK. FOR DIABETES., Disp: 9 mL, Rfl: 0   sertraline  (ZOLOFT ) 100 MG tablet, TAKE 1 TABLET(100 MG) BY MOUTH DAILY FOR ANXIETY. TAKE WITH 25 MG DOSE., Disp: 90 tablet, Rfl: 2   traZODone  (DESYREL ) 150 MG tablet, TAKE 1 TABLET BY MOUTH EVERY DAY AT BEDTIME FOR SLEEP, Disp: 90 tablet, Rfl: 2   valACYclovir  (VALTREX ) 1000 MG tablet, Take 2 tablets (2,000 mg total) by mouth 2 (two) times daily. For one day (Patient not taking: Reported on 01/21/2024), Disp: 4 tablet, Rfl: 0   Varenicline  Tartrate, Starter, (CHANTIX  STARTING MONTH PAK) 0.5 MG X 11 & 1 MG X 42 TBPK,  Take one 0.5 mg tablet by mouth once daily for 3 days, then increase to one 0.5 mg tablet twice daily for 4 days, then increase to one 1 mg tablet twice daily., Disp: 53 each, Rfl: 0   Medications ordered in this encounter:  Meds ordered this encounter  Medications   predniSONE  (DELTASONE ) 20 MG tablet    Sig: Take 1 tablet (20 mg total) by mouth daily with breakfast for 5 days.    Dispense:  5 tablet    Refill:  0    Supervising Provider:   LAMPTEY, PHILIP O [[id number]]   promethazine -dextromethorphan (PROMETHAZINE -DM) 6.25-15 MG/5ML syrup    Sig: Take 5  mLs by mouth 4 (four) times daily as needed.    Dispense:  240 mL    Refill:  0    Supervising Provider:   LAMPTEY, PHILIP O [[id number]]   ipratropium (ATROVENT ) 0.03 % nasal spray    Sig: Place 2 sprays into both nostrils every 12 (twelve) hours.    Dispense:  30 mL    Refill:  12    Supervising Provider:   LAMPTEY, PHILIP O [[id number]]   ibuprofen  (ADVIL ) 800 MG tablet    Sig: Take 1 tablet (800 mg total) by mouth every 8 (eight) hours as needed for moderate pain (pain score 4-6).    Dispense:  30 tablet    Refill:  0    Supervising Provider:   LAMPTEY, PHILIP O [[id number]]     *If you need refills on other medications prior to your next appointment, please contact your pharmacy*  Follow-Up: Call back or seek an in-person evaluation if the symptoms worsen or if the condition fails to improve as anticipated.  [facility] [phone]  Other Instructions    If you have been instructed to have an in-person evaluation today at a local Urgent Care facility, please use the link below. It will take you to a list of all of our available [facility], including address, phone number and hours of operation. Please do not delay care.  [facility]  If you or a family member do not have a primary care provider, use the link below to schedule a visit and establish care. When you choose a [facility] primary care physician or advanced practice provider, you gain a long-term partner in health. Find a Primary Care Provider  Learn more about [facility]'s in-office and virtual care options: [facility] Get Care Now  "

## 2024-08-07 ENCOUNTER — Ambulatory Visit: Payer: Self-pay | Admitting: Primary Care

## 2024-08-07 ENCOUNTER — Ambulatory Visit: Admitting: Primary Care

## 2024-08-07 VITALS — BP 140/86 | HR 84 | Temp 97.5°F | Ht 69.0 in | Wt 175.6 lb

## 2024-08-07 DIAGNOSIS — E1165 Type 2 diabetes mellitus with hyperglycemia: Secondary | ICD-10-CM

## 2024-08-07 DIAGNOSIS — Z7985 Long-term (current) use of injectable non-insulin antidiabetic drugs: Secondary | ICD-10-CM

## 2024-08-07 LAB — POCT GLYCOSYLATED HEMOGLOBIN (HGB A1C): Hemoglobin A1C: 6 % — AB (ref 4.0–5.6)

## 2024-08-07 NOTE — Progress Notes (Signed)
 "  Subjective:    Patient ID: Michelle Marshall, female    DOB: 20-Mar-1966, 59 y.o.   MRN: [id number]  Michelle Marshall is a very pleasant 59 y.o. female with a history of hypertension, type 2 diabetes, GAD, iron-deficiency anemia who presents today for follow-up of diabetes.  1) Type 2 Diabetes:  Current medications include: Ozempic  2 mg weekly.  She is checking her blood glucose 2 times weekly and is getting readings of mid 100s  Last A1C: 6.3 in July 2025, 6.0 today Last Eye Exam: Due Last Foot Exam: Due Pneumonia Vaccination: 2018 Urine Microalbumin: July 2025 Statin: None  Dietary changes since last visit: She eats out at restaurants mostly   Exercise: None  BP Readings from Last 3 Encounters:  08/07/24 (!) 140/86  01/21/24 102/78  08/19/23 (!) 144/95    Wt Readings from Last 3 Encounters:  08/07/24 175 lb 9.6 oz (79.7 kg)  01/21/24 177 lb (80.3 kg)  08/18/23 180 lb (81.6 kg)      Review of Systems  Respiratory:  Negative for shortness of breath.   Cardiovascular:  Negative for chest pain.  Neurological:  Negative for numbness.         Past Medical History:  Diagnosis Date   Abdominal mass 04/06/2020   Abnormal uterine bleeding 08/21/2015   May 2018-spotting associated mild cramping   Acute pain of left knee 10/15/2018   Acute pain of right foot 08/31/2020   Amenorrhea 01/25/2016   Anemia    Arthritis    Complication of anesthesia 2002   DURING SCAR TISSUE EXCISION FROM FALLOPIAN TUBES(2002), PT WAS GIVEN GENERAL ANESTHESIA AND PT BEGAN HAVING A REACTION TO THE ANESTHESIA AND LIPS AND TONGUE STARTED SWELLING AND THE SURGERY HAD TO BE STOPPED DUE TO AIRWAY CLOSING UP-PT HAD GA PRIOR TO THIS FOR HER GALLBLADDER WITH NO PROBLEM PREVIOUSLY   Depression    Diabetes mellitus without complication (HCC)    Dysrhythmia    H/O TACHYCARDIA. stress and anxiety brings it on   Family history of adverse reaction to anesthesia    BROTHER-HARD TIME WAKING UP    Grief 04/23/2018   Recent loss of husband August 2019-brain aneurysm   Hypertension    Irregular periods    Postop check-status post hysteroscopy/D&C 10/09/2017   Status post endometrial ablation 01/25/2016   Vitamin D deficiency    per patient, no longer a problem    Social History   Socioeconomic History   Marital status: Widowed    Spouse name: Not on file   Number of children: 1   Years of education: Not on file   Highest education level: Not on file  Occupational History   Not on file  Tobacco Use   Smoking status: Some Days    Current packs/day: 0.00    Average packs/day: 0.2 packs/day for 20.0 years (3.0 ttl pk-yrs)    Types: Cigarettes    Start date: 09/05/1995    Last attempt to quit: 09/05/2015    Years since quitting: 8.9   Smokeless tobacco: Never  Vaping Use   Vaping status: Never Used  Substance and Sexual Activity   Alcohol use: Not Currently    Alcohol/week: 1.0 standard drink of alcohol    Types: 1 Glasses of wine per week    Comment: occasional glass of wine   Drug use: No   Sexual activity: Yes    Birth control/protection: None, Post-menopausal    Comment: ablation  Other Topics Concern   Not on file  Social History Narrative   Married.   1 child.   She works as a Chartered Loss Adjuster   Enjoys shopping, traveling.    Social Drivers of Health   Tobacco Use: High Risk (08/01/2024)   Patient History    Smoking Tobacco Use: Some Days    Smokeless Tobacco Use: Never    Passive Exposure: Not on file  Financial Resource Strain: Not on file  Food Insecurity: Not on file  Transportation Needs: Not on file  Physical Activity: Not on file  Stress: Not on file  Social Connections: Not on file  Intimate Partner Violence: Not on file  Depression (PHQ2-9): Low Risk (08/07/2024)   Depression (PHQ2-9)    PHQ-2 Score: 0  Alcohol Screen: Not on file  Housing: Not on file  Utilities: Not on file  Health Literacy: Not on file    Past Surgical History:   Procedure Laterality Date   CHOLECYSTECTOMY     DILATATION & CURETTAGE/HYSTEROSCOPY WITH MYOSURE N/A 09/30/2017   Procedure: DILATATION & CURETTAGE/HYSTEROSCOPY;  Surgeon: Kathe Gladis LABOR, MD;  Location: [facility];  Service: Gynecology;  Laterality: N/A;   DILITATION & CURRETTAGE/HYSTROSCOPY WITH NOVASURE ABLATION N/A 09/19/2015   Procedure: DILATATION & CURETTAGE/HYSTEROSCOPY WITH NOVASURE ABLATION;  Surgeon: Gladis LABOR Kathe, MD;  Location: [facility];  Service: Gynecology;  Laterality: N/A;   INSERTION OF MESH  06/08/2021   Procedure: INSERTION OF MESH;  Surgeon: Jordis Laneta FALCON, MD;  Location: [facility];  Service: General;;   JOINT REPLACEMENT Left    30 years ago. knee   KNEE ARTHROSCOPY Left    VENTRAL HERNIA REPAIR N/A 06/08/2021   Procedure: HERNIA REPAIR VENTRAL ADULT, incisional;  Surgeon: Jordis Laneta FALCON, MD;  Location: [facility];  Service: General;  Laterality: N/A;    Family History  Problem Relation Age of Onset   Cancer Father        throat   Hypertension Mother    Diabetes Paternal Grandmother    Breast cancer Neg Hx     Allergies[1]  Medications Ordered Prior to Encounter[2]  BP (!) 140/86   Pulse 84   Temp (!) 97.5 F (36.4 C) (Oral)   Ht 5' 9 (1.753 m)   Wt 175 lb 9.6 oz (79.7 kg)   SpO2 96%   BMI 25.93 kg/m  Objective:   Physical Exam HENT:     Nose: No mucosal edema.     Right Sinus: No maxillary sinus tenderness or frontal sinus tenderness.     Left Sinus: No maxillary sinus tenderness or frontal sinus tenderness.  Cardiovascular:     Rate and Rhythm: Normal rate and regular rhythm.  Pulmonary:     Effort: Pulmonary effort is normal.     Breath sounds: Normal breath sounds. No wheezing or rhonchi.  Skin:    General: Skin is warm and dry.  Neurological:     Mental Status: She is alert.     Physical Exam        Assessment & Plan:  Type 2 diabetes mellitus with hyperglycemia, without long-term current use of insulin (HCC) Assessment  & Plan: Improved with A1c of 6.0 today.  Continue Ozempic  2 mg weekly. Foot exam today.  She will schedule eye exam.  Follow-up in 6 months  Orders: -     POCT glycosylated hemoglobin (Hb A1C)    Assessment and Plan Assessment & Plan         Michelle MARLA Gaskins, NP       [1]  Allergies  Allergen Reactions   Lisinopril Swelling    Lips and face   Losartan  Other (See Comments)    Mouth swollen, rash and itching   Wellbutrin [Bupropion] Hives and Swelling    Swelling of mouth   Amlodipine  Swelling   Percocet [Oxycodone -Acetaminophen ] Anxiety  [2]  Current Outpatient Medications on File Prior to Visit  Medication Sig Dispense Refill   amLODipine  (NORVASC ) 5 MG tablet TAKE 1 TABLET BY MOUTH EVERY DAY FOR BLOOD PRESSURE 90 tablet 2   busPIRone  (BUSPAR ) 5 MG tablet TAKE 1 TABLET BY MOUTH 2 TIMES DAILY. FOR ANXIETY 180 tablet 2   carvedilol  (COREG ) 25 MG tablet Take 1 tablet (25 mg total) by mouth 2 (two) times daily with a meal. TAKE 1 TABLET(12.5 MG) BY MOUTH TWICE DAILY WITH A MEAL for heart rate 60 tablet 1   cyclobenzaprine  (FLEXERIL ) 10 MG tablet Take 1 tablet (10 mg total) by mouth 3 (three) times daily as needed for muscle spasms. 30 tablet 0   EPINEPHrine  0.3 mg/0.3 mL IJ SOAJ injection Inject 0.3 mg into the muscle as needed for anaphylaxis.     estradiol -norethindrone  (MIMVEY) 1-0.5 MG tablet Take 1 tablet by mouth daily. 90 tablet 3   hydrochlorothiazide  (HYDRODIURIL ) 25 MG tablet Take 1 tablet (25 mg total) by mouth daily. 90 tablet 2   ibuprofen  (ADVIL ) 800 MG tablet Take 1 tablet (800 mg total) by mouth every 8 (eight) hours as needed for moderate pain (pain score 4-6). 30 tablet 0   ipratropium (ATROVENT ) 0.03 % nasal spray Place 2 sprays into both nostrils every 12 (twelve) hours. 30 mL 12   Multiple Vitamin (MULTIVITAMIN) tablet Take 1 tablet by mouth daily.     potassium chloride  SA (KLOR-CON  M) 20 MEQ tablet TAKE 1 TABLET BY MOUTH EVERY DAY 90 tablet 0    promethazine -dextromethorphan (PROMETHAZINE -DM) 6.25-15 MG/5ML syrup Take 5 mLs by mouth 4 (four) times daily as needed. 240 mL 0   Semaglutide , 2 MG/DOSE, (OZEMPIC , 2 MG/DOSE,) 8 MG/3ML SOPN INJECT 2 MG INTO THE SKIN ONCE A WEEK. FOR DIABETES. 9 mL 0   sertraline  (ZOLOFT ) 100 MG tablet TAKE 1 TABLET(100 MG) BY MOUTH DAILY FOR ANXIETY. TAKE WITH 25 MG DOSE. 90 tablet 2   traZODone  (DESYREL ) 150 MG tablet TAKE 1 TABLET BY MOUTH EVERY DAY AT BEDTIME FOR SLEEP 90 tablet 2   valACYclovir  (VALTREX ) 1000 MG tablet Take 2 tablets (2,000 mg total) by mouth 2 (two) times daily. For one day 4 tablet 0   Varenicline  Tartrate, Starter, (CHANTIX  STARTING MONTH PAK) 0.5 MG X 11 & 1 MG X 42 TBPK Take one 0.5 mg tablet by mouth once daily for 3 days, then increase to one 0.5 mg tablet twice daily for 4 days, then increase to one 1 mg tablet twice daily. 53 each 0   No current facility-administered medications on file prior to visit.   "

## 2024-08-07 NOTE — Assessment & Plan Note (Signed)
 Improved with A1c of 6.0 today.  Continue Ozempic  2 mg weekly. Foot exam today.  She will schedule eye exam.  Follow-up in 6 months

## 2024-08-07 NOTE — Patient Instructions (Signed)
 Continue Ozempic  2 mg weekly for diabetes.  Please schedule a physical to meet with me in 6 months.   It was a pleasure to see you today!

## 2025-01-21 ENCOUNTER — Encounter: Admitting: Primary Care
